# Patient Record
Sex: Female | Born: 1950 | Race: White | Hispanic: No | Smoking: Former smoker
Health system: Southern US, Community
[De-identification: ages and names within clinical notes are randomized; demographics above are authoritative.]

## PROBLEM LIST (undated history)

## (undated) ENCOUNTER — Emergency Department (HOSPITAL_COMMUNITY): Payer: Self-pay

## (undated) ENCOUNTER — Ambulatory Visit: Admission: EM | Payer: Medicare Other

## (undated) DIAGNOSIS — Z9841 Cataract extraction status, right eye: Secondary | ICD-10-CM

## (undated) DIAGNOSIS — N183 Chronic kidney disease, stage 3 unspecified: Secondary | ICD-10-CM

## (undated) DIAGNOSIS — G4730 Sleep apnea, unspecified: Secondary | ICD-10-CM

## (undated) DIAGNOSIS — Z79620 Long term (current) use of immunosuppressive biologic: Secondary | ICD-10-CM

## (undated) DIAGNOSIS — E119 Type 2 diabetes mellitus without complications: Secondary | ICD-10-CM

## (undated) DIAGNOSIS — M1712 Unilateral primary osteoarthritis, left knee: Secondary | ICD-10-CM

## (undated) DIAGNOSIS — I10 Essential (primary) hypertension: Secondary | ICD-10-CM

## (undated) DIAGNOSIS — E785 Hyperlipidemia, unspecified: Secondary | ICD-10-CM

## (undated) DIAGNOSIS — L409 Psoriasis, unspecified: Secondary | ICD-10-CM

## (undated) DIAGNOSIS — R5383 Other fatigue: Secondary | ICD-10-CM

## (undated) DIAGNOSIS — M25569 Pain in unspecified knee: Secondary | ICD-10-CM

## (undated) DIAGNOSIS — I3139 Other pericardial effusion (noninflammatory): Secondary | ICD-10-CM

## (undated) DIAGNOSIS — I2510 Atherosclerotic heart disease of native coronary artery without angina pectoris: Secondary | ICD-10-CM

## (undated) DIAGNOSIS — I5189 Other ill-defined heart diseases: Secondary | ICD-10-CM

## (undated) DIAGNOSIS — K5790 Diverticulosis of intestine, part unspecified, without perforation or abscess without bleeding: Secondary | ICD-10-CM

## (undated) DIAGNOSIS — Z9989 Dependence on other enabling machines and devices: Secondary | ICD-10-CM

## (undated) DIAGNOSIS — J449 Chronic obstructive pulmonary disease, unspecified: Secondary | ICD-10-CM

## (undated) DIAGNOSIS — R7303 Prediabetes: Secondary | ICD-10-CM

## (undated) DIAGNOSIS — C801 Malignant (primary) neoplasm, unspecified: Secondary | ICD-10-CM

## (undated) DIAGNOSIS — G4733 Obstructive sleep apnea (adult) (pediatric): Secondary | ICD-10-CM

## (undated) DIAGNOSIS — H269 Unspecified cataract: Secondary | ICD-10-CM

## (undated) DIAGNOSIS — G4700 Insomnia, unspecified: Secondary | ICD-10-CM

## (undated) HISTORY — PX: LUMBAR SPINE SURGERY: SHX701

## (undated) HISTORY — PX: BLADDER SURGERY: SHX569

## (undated) HISTORY — PX: BACK SURGERY: SHX140

## (undated) HISTORY — PX: VAGINAL HYSTERECTOMY: SHX2639

## (undated) HISTORY — PX: ABDOMINAL HYSTERECTOMY: SHX81

## (undated) HISTORY — PX: VITRECTOMY: SHX106

## (undated) HISTORY — PX: KNEE SURGERY: SHX244

---

## 1998-04-10 DIAGNOSIS — C679 Malignant neoplasm of bladder, unspecified: Secondary | ICD-10-CM

## 1998-04-10 HISTORY — DX: Malignant neoplasm of bladder, unspecified: C67.9

## 2012-07-14 ENCOUNTER — Ambulatory Visit: Payer: Self-pay

## 2012-07-16 DIAGNOSIS — D62 Acute posthemorrhagic anemia: Secondary | ICD-10-CM | POA: Insufficient documentation

## 2012-07-16 HISTORY — PX: COLONOSCOPY: SHX174

## 2012-07-17 DIAGNOSIS — K5790 Diverticulosis of intestine, part unspecified, without perforation or abscess without bleeding: Secondary | ICD-10-CM | POA: Insufficient documentation

## 2012-09-23 ENCOUNTER — Ambulatory Visit: Payer: Self-pay | Admitting: Family Medicine

## 2013-07-17 ENCOUNTER — Emergency Department: Payer: Self-pay | Admitting: Emergency Medicine

## 2013-07-18 ENCOUNTER — Ambulatory Visit: Payer: Self-pay | Admitting: Family Medicine

## 2013-09-03 DIAGNOSIS — R7303 Prediabetes: Secondary | ICD-10-CM | POA: Insufficient documentation

## 2013-09-03 DIAGNOSIS — E1169 Type 2 diabetes mellitus with other specified complication: Secondary | ICD-10-CM | POA: Insufficient documentation

## 2013-09-03 DIAGNOSIS — E119 Type 2 diabetes mellitus without complications: Secondary | ICD-10-CM | POA: Insufficient documentation

## 2013-09-25 DIAGNOSIS — R4589 Other symptoms and signs involving emotional state: Secondary | ICD-10-CM | POA: Insufficient documentation

## 2013-09-25 DIAGNOSIS — G479 Sleep disorder, unspecified: Secondary | ICD-10-CM | POA: Insufficient documentation

## 2013-10-07 ENCOUNTER — Ambulatory Visit: Payer: Self-pay | Admitting: Neurology

## 2013-10-28 ENCOUNTER — Ambulatory Visit: Payer: Self-pay | Admitting: Neurology

## 2013-11-12 ENCOUNTER — Ambulatory Visit: Payer: Self-pay | Admitting: Neurology

## 2013-12-09 ENCOUNTER — Ambulatory Visit: Payer: Self-pay | Admitting: Neurology

## 2013-12-09 ENCOUNTER — Ambulatory Visit: Payer: Self-pay | Admitting: Family Medicine

## 2014-01-08 ENCOUNTER — Ambulatory Visit: Payer: Self-pay | Admitting: Neurology

## 2014-03-26 DIAGNOSIS — F32A Depression, unspecified: Secondary | ICD-10-CM | POA: Insufficient documentation

## 2014-09-25 ENCOUNTER — Ambulatory Visit: Payer: Self-pay | Admitting: Podiatry

## 2016-08-08 ENCOUNTER — Other Ambulatory Visit: Payer: Self-pay | Admitting: Family Medicine

## 2016-08-08 DIAGNOSIS — Z1231 Encounter for screening mammogram for malignant neoplasm of breast: Secondary | ICD-10-CM

## 2016-08-16 ENCOUNTER — Emergency Department
Admission: EM | Admit: 2016-08-16 | Discharge: 2016-08-16 | Disposition: A | Discharge status: Home / Self Care | Payer: Medicare Other | Attending: Student in an Organized Health Care Education/Training Program | Admitting: Student in an Organized Health Care Education/Training Program

## 2016-08-16 ENCOUNTER — Encounter: Payer: Self-pay | Admitting: Emergency Medicine

## 2016-08-16 DIAGNOSIS — R519 Headache, unspecified: Secondary | ICD-10-CM

## 2016-08-16 DIAGNOSIS — I10 Essential (primary) hypertension: Secondary | ICD-10-CM

## 2016-08-16 DIAGNOSIS — Z87891 Personal history of nicotine dependence: Secondary | ICD-10-CM | POA: Diagnosis not present

## 2016-08-16 DIAGNOSIS — R51 Headache: Secondary | ICD-10-CM | POA: Diagnosis present

## 2016-08-16 LAB — CBC
HCT: 47.3 % — ABNORMAL HIGH (ref 35.0–47.0)
Hemoglobin: 15.8 g/dL (ref 12.0–16.0)
MCH: 29.1 pg (ref 26.0–34.0)
MCHC: 33.5 g/dL (ref 32.0–36.0)
MCV: 86.9 fL (ref 80.0–100.0)
PLATELETS: 221 10*3/uL (ref 150–440)
RBC: 5.44 MIL/uL — ABNORMAL HIGH (ref 3.80–5.20)
RDW: 13.6 % (ref 11.5–14.5)
WBC: 14.7 10*3/uL — ABNORMAL HIGH (ref 3.6–11.0)

## 2016-08-16 LAB — BASIC METABOLIC PANEL
Anion gap: 7 (ref 5–15)
BUN: 12 mg/dL (ref 6–20)
CHLORIDE: 102 mmol/L (ref 101–111)
CO2: 29 mmol/L (ref 22–32)
CREATININE: 1.14 mg/dL — AB (ref 0.44–1.00)
Calcium: 10 mg/dL (ref 8.9–10.3)
GFR calc Af Amer: 56 mL/min — ABNORMAL LOW (ref >60)
GFR calc non Af Amer: 49 mL/min — ABNORMAL LOW (ref >60)
GLUCOSE: 113 mg/dL — AB (ref 65–99)
POTASSIUM: 3.8 mmol/L (ref 3.5–5.1)
SODIUM: 138 mmol/L (ref 135–145)

## 2016-08-16 LAB — TROPONIN I

## 2016-08-16 MED ORDER — ACETAMINOPHEN 500 MG PO TABS
1000.0000 mg | ORAL_TABLET | Freq: Once | ORAL | Status: AC
  Administered 2016-08-16: 1000 mg via ORAL

## 2016-08-16 MED ORDER — ACETAMINOPHEN 500 MG PO TABS
ORAL_TABLET | ORAL | Status: AC
  Filled 2016-08-16: qty 2

## 2016-08-16 MED ORDER — AMLODIPINE BESYLATE 5 MG PO TABS: 5.0000 mg | ORAL_TABLET | Freq: Every day | ORAL | 0 refills | Status: DC

## 2016-08-16 MED ORDER — AMLODIPINE BESYLATE 5 MG PO TABS
5.0000 mg | ORAL_TABLET | Freq: Once | ORAL | Status: AC
  Administered 2016-08-16: 5 mg via ORAL
  Filled 2016-08-16: qty 1

## 2016-08-16 NOTE — ED Triage Notes (Signed)
Pt to ed with c/o HTN this am,  Pt states she took HCTZ and lasix this am.  Pt denies chest pain, denies blurred vision,  States she had a headache but took 2 tylenol and felt better.  Pt alert and oriented and appears in nad. Pt cardiologist, Dr Ubaldo Glassing.

## 2016-08-16 NOTE — ED Provider Notes (Signed)
[facility] Emergency Department Provider Note    None    (approximate)  I have reviewed the triage vital signs and the nursing notes.   HISTORY  Chief Complaint Hypertension    HPI Kelsey Mendoza is a 66 y.o. female Presents with concern for elevated blood pressure since yesterday. Patient is on HCTZ and Lasix for her blood pressure which is managed by Dr. Ubaldo Glassing.  She checked her blood pressure this morning and noted it was elevated to 190/110. She denies any chest pain. No shortness of breath. No lower extremity swelling. No blurry vision. No numbness or tingling. States that she took the blood pressure again it was persistently elevated. This was despite taking her morning HCTZ and Lasix. Patient came in to the ER to be further evaluated.   History reviewed. No pertinent past medical history. History reviewed. No pertinent family history. History reviewed. No pertinent surgical history. There are no active problems to display for this patient.     Prior to Admission medications   Not on File    Allergies Patient has no known allergies.    Social History Social History  Substance Use Topics  . Smoking status: Former Research scientist (life sciences)  . Smokeless tobacco: Never Used  . Alcohol use No    Review of Systems Patient denies headaches, rhinorrhea, blurry vision, numbness, shortness of breath, chest pain, edema, cough, abdominal pain, nausea, vomiting, diarrhea, dysuria, fevers, rashes or hallucinations unless otherwise stated above in HPI. ____________________________________________   PHYSICAL EXAM:  VITAL SIGNS: Vitals:   08/16/16 1141 08/16/16 1325  BP: (!) 195/102 (!) 191/108  Pulse: (!) 59   Resp: 18   Temp: 98.6 F (37 C)     Constitutional: Alert and oriented. Well appearing and in no acute distress. Eyes: Conjunctivae are normal. PERRL. EOMI. Head: Atraumatic. Nose: No congestion/rhinnorhea. Mouth/Throat: Mucous membranes are  moist.  Oropharynx non-erythematous. Neck: No stridor. Painless ROM. No cervical spine tenderness to palpation Hematological/Lymphatic/Immunilogical: No cervical lymphadenopathy. Cardiovascular: Normal rate, regular rhythm. Grossly normal heart sounds.  Good peripheral circulation. Respiratory: Normal respiratory effort.  No retractions. Lungs CTAB. Gastrointestinal: Soft and nontender. No distention. No abdominal bruits. No CVA tenderness. Genitourinary:  Musculoskeletal: No lower extremity tenderness nor edema.  No joint effusions. Neurologic:  Normal speech and language. No gross focal neurologic deficits are appreciated. No gait instability. Skin:  Skin is warm, dry and intact. No rash noted. Psychiatric: Mood and affect are normal. Speech and behavior are normal.  ____________________________________________   LABS (all labs ordered are listed, but only abnormal results are displayed)  Results for orders placed or performed during the hospital encounter of 08/16/16 (from the past 24 hour(s))  CBC     Status: Abnormal   Collection Time: 08/16/16 11:53 AM  Result Value Ref Range   WBC 14.7 (H) 3.6 - 11.0 K/uL   RBC 5.44 (H) 3.80 - 5.20 MIL/uL   Hemoglobin 15.8 12.0 - 16.0 g/dL   HCT 47.3 (H) 35.0 - 47.0 %   MCV 86.9 80.0 - 100.0 fL   MCH 29.1 26.0 - 34.0 pg   MCHC 33.5 32.0 - 36.0 g/dL   RDW 13.6 11.5 - 14.5 %   Platelets 221 150 - 440 K/uL  Basic metabolic panel     Status: Abnormal   Collection Time: 08/16/16 11:53 AM  Result Value Ref Range   Sodium 138 135 - 145 mmol/L   Potassium 3.8 3.5 - 5.1 mmol/L   Chloride 102 101 -  111 mmol/L   CO2 29 22 - 32 mmol/L   Glucose, Bld 113 (H) 65 - 99 mg/dL   BUN 12 6 - 20 mg/dL   Creatinine, Ser 1.14 (H) 0.44 - 1.00 mg/dL   Calcium 10.0 8.9 - 10.3 mg/dL   GFR calc non Af Amer 49 (L) >60 mL/min   GFR calc Af Amer 56 (L) >60 mL/min   Anion gap 7 5 - 15  Troponin I     Status: None   Collection Time: 08/16/16 11:53 AM  Result  Value Ref Range   Troponin I <0.03 <0.03 ng/mL   ____________________________________________  EKG My review and personal interpretation at Time: 11:50   Indication: htn  Rate: 53  Rhythm: sinus Axis: normal Other: IRBB, no stemi, normal intervals ____________________________________________  RADIOLOGY   ____________________________________________   PROCEDURES  Procedure(s) performed:  Procedures    Critical Care performed: no ____________________________________________   INITIAL IMPRESSION / ASSESSMENT AND PLAN / ED COURSE  Pertinent labs & imaging results that were available during my care of the patient were reviewed by me and considered in my medical decision making (see chart for details).  DDX: HTN, chf, med noncompliance  Kelsey Mendoza is a 66 y.o. non-distressed patient presenting with concern for elevated BP. Patient is AF,VSS with HTN in ED. Exam as above. Given current presentation have considered the above differential. No report of missed antihypertensive doses or medical non-compliance. No* report of illicit drug use that could elevate BP. Extensive evaluation of possible end organ damage pursued in ED. No evidence of acute renal dysfunction. Neuro exam with no focal deficits. EKG withno evidence of ischemia. Trop negative. Renal function at baseline. Not consistent with CHF, malignant htn, adrenergic crisis or hypertensive emergency.   Will have patient start low dose amlodipine follow up with PCP for BP recheck. Discussed strict return parameters.       ____________________________________________   FINAL CLINICAL IMPRESSION(S) / ED DIAGNOSES  Final diagnoses:  Hypertension, unspecified type  Acute nonintractable headache, unspecified headache type      NEW MEDICATIONS STARTED DURING THIS VISIT:  New Prescriptions   No medications on file     Note:  This document was prepared using Dragon voice recognition software and may include  unintentional dictation errors.    Merlyn Lot, MD 08/16/16 [phone]

## 2016-08-30 ENCOUNTER — Ambulatory Visit
Admission: RE | Admit: 2016-08-30 | Discharge: 2016-08-30 | Disposition: A | Discharge status: Home / Self Care | Payer: Medicare Other | Source: Ambulatory Visit | Attending: Family Medicine | Admitting: Family Medicine

## 2016-08-30 DIAGNOSIS — Z1231 Encounter for screening mammogram for malignant neoplasm of breast: Secondary | ICD-10-CM | POA: Diagnosis not present

## 2016-10-30 ENCOUNTER — Other Ambulatory Visit: Payer: Self-pay | Admitting: Unknown Physician Specialty

## 2016-10-30 DIAGNOSIS — M1711 Unilateral primary osteoarthritis, right knee: Secondary | ICD-10-CM

## 2016-11-03 ENCOUNTER — Ambulatory Visit
Admission: RE | Admit: 2016-11-03 | Discharge: 2016-11-03 | Disposition: A | Discharge status: Home / Self Care | Payer: Medicare Other | Source: Ambulatory Visit | Attending: Unknown Physician Specialty | Admitting: Unknown Physician Specialty

## 2016-11-03 DIAGNOSIS — M94261 Chondromalacia, right knee: Secondary | ICD-10-CM | POA: Insufficient documentation

## 2016-11-03 DIAGNOSIS — M25461 Effusion, right knee: Secondary | ICD-10-CM | POA: Diagnosis not present

## 2016-11-03 DIAGNOSIS — M1711 Unilateral primary osteoarthritis, right knee: Secondary | ICD-10-CM | POA: Insufficient documentation

## 2016-11-03 DIAGNOSIS — S83241A Other tear of medial meniscus, current injury, right knee, initial encounter: Secondary | ICD-10-CM | POA: Insufficient documentation

## 2016-11-03 DIAGNOSIS — X58XXXA Exposure to other specified factors, initial encounter: Secondary | ICD-10-CM | POA: Diagnosis not present

## 2016-11-27 DIAGNOSIS — S8002XA Contusion of left knee, initial encounter: Secondary | ICD-10-CM | POA: Insufficient documentation

## 2016-11-27 DIAGNOSIS — S8001XA Contusion of right knee, initial encounter: Secondary | ICD-10-CM | POA: Insufficient documentation

## 2016-12-29 HISTORY — PX: MEDIAL PARTIAL KNEE REPLACEMENT: SHX5965

## 2017-01-30 ENCOUNTER — Ambulatory Visit
Admission: EM | Admit: 2017-01-30 | Discharge: 2017-01-30 | Disposition: A | Discharge status: Home / Self Care | Payer: Medicare Other | Attending: Family Medicine | Admitting: Family Medicine

## 2017-01-30 ENCOUNTER — Ambulatory Visit: Payer: Medicare Other

## 2017-01-30 ENCOUNTER — Encounter: Payer: Self-pay | Admitting: *Deleted

## 2017-01-30 DIAGNOSIS — M25511 Pain in right shoulder: Secondary | ICD-10-CM | POA: Diagnosis present

## 2017-01-30 DIAGNOSIS — M5412 Radiculopathy, cervical region: Secondary | ICD-10-CM

## 2017-01-30 DIAGNOSIS — M5030 Other cervical disc degeneration, unspecified cervical region: Secondary | ICD-10-CM | POA: Insufficient documentation

## 2017-01-30 HISTORY — DX: Pain in unspecified knee: M25.569

## 2017-01-30 HISTORY — DX: Essential (primary) hypertension: I10

## 2017-01-30 HISTORY — DX: Dependence on other enabling machines and devices: Z99.89

## 2017-01-30 HISTORY — DX: Malignant (primary) neoplasm, unspecified: C80.1

## 2017-01-30 MED ORDER — KETOROLAC TROMETHAMINE 60 MG/2ML IM SOLN
30.0000 mg | Freq: Once | INTRAMUSCULAR | Status: AC
  Administered 2017-01-30: 30 mg via INTRAMUSCULAR

## 2017-01-30 MED ORDER — NAPROXEN 375 MG PO TABS: 375.0000 mg | ORAL_TABLET | Freq: Two times a day (BID) | ORAL | 0 refills | Status: DC

## 2017-01-30 MED ORDER — DIAZEPAM 2 MG PO TABS: ORAL_TABLET | ORAL | 0 refills | Status: DC

## 2017-01-30 MED ORDER — TIZANIDINE HCL 4 MG PO CAPS: 4.0000 mg | ORAL_CAPSULE | Freq: Three times a day (TID) | ORAL | 0 refills | Status: DC

## 2017-01-30 NOTE — ED Provider Notes (Signed)
[facility]    CSN: [id number] Arrival date & time: 01/30/17  6010     History   Chief Complaint Chief Complaint  Patient presents with  . Shoulder Pain    HPI Kelsey Mendoza is a 66 y.o. female.   HPI  This a 66 year old female who presents with 2 day history of right shoulder blade pain and right trapezial pain with radiation into her right posterior arm to the level of her elbow. Started very suddenly she was watching TV. No history of any injury that she can remember. He states that the pain has been present almost constantly for the last 3 days radiating at as an 8-9 out of 10 level. Denies any numbness or tingling into her fingers. Cervical range of motion is nearly full except to rightward rotation and lateral flexion and extension.        Past Medical History:  Diagnosis Date  . Arthritis   . Cancer ([place])   . CPAP (continuous positive airway pressure) dependence   . Hypertension   . Knee pain     There are no active problems to display for this patient.   Past Surgical History:  Procedure Laterality Date  . ABDOMINAL HYSTERECTOMY    . BACK SURGERY    . BLADDER SURGERY    . KNEE SURGERY      OB History    No data available       Home Medications    Prior to Admission medications   Medication Sig Start Date End Date Taking? Authorizing Provider  amLODipine (NORVASC) 5 MG tablet Take 1 tablet (5 mg total) by mouth daily. 08/16/16 08/16/17 Yes Merlyn Lot, MD  aspirin 325 MG tablet Take 325 mg by mouth daily.   Yes [provider]  Cholecalciferol (VITAMIN D3 PO) Take by mouth daily.   Yes [provider]  furosemide (LASIX) 40 MG tablet Take 40 mg by mouth as needed.   Yes [provider]  hydrocortisone 2.5 % cream Apply topically 2 (two) times daily.   Yes [provider]  ketoconazole (NIZORAL) 2 % cream Apply 1 application topically daily.   Yes [provider]  ketoconazole  (NIZORAL) 2 % shampoo Apply 1 application topically 2 (two) times a week.   Yes [provider]  losartan (COZAAR) 100 MG tablet Take 100 mg by mouth daily.   Yes [provider]  meloxicam (MOBIC) 7.5 MG tablet Take 7.5 mg by mouth daily.   Yes [provider]  OxyCODONE HCl, Abuse Deter, (OXAYDO) 5 MG TABA Take 5 mg by mouth as needed.   Yes [provider]  rosuvastatin (CRESTOR) 5 MG tablet Take 5 mg by mouth daily.   Yes [provider]  triamterene-hydrochlorothiazide (MAXZIDE-25) 37.5-25 MG tablet Take 1 tablet by mouth daily.   Yes [provider]  urea (CARMOL) 20 % cream Apply topically daily as needed.   Yes [provider]  diazepam (VALIUM) 2 MG tablet Take 1 tablet every 8 hours for muscle spasm. After completing the 6 tablets switch over to the regular muscle relaxant. 01/30/17   Lorin Picket, PA-C  naproxen (NAPROSYN) 375 MG tablet Take 1 tablet (375 mg total) by mouth 2 (two) times daily. Take with food 01/30/17   Crecencio Mc P, PA-C  tiZANidine (ZANAFLEX) 4 MG capsule Take 1 capsule (4 mg total) by mouth 3 (three) times daily. 01/30/17   Lorin Picket, PA-C    Family History  Family History  Problem Relation Age of Onset  . Hypertension Mother   . Hypertension Father   . CAD Father   . Breast cancer Neg Hx     Social History Social History  Substance Use Topics  . Smoking status: Former Research scientist (life sciences)  . Smokeless tobacco: Never Used  . Alcohol use No     Allergies   Patient has no known allergies.   Review of Systems Review of Systems  Constitutional: Positive for activity change. Negative for appetite change, chills, diaphoresis, fatigue and fever.  Musculoskeletal: Positive for myalgias, neck pain and neck stiffness.  All other systems reviewed and are negative.    Physical Exam Triage Vital Signs ED Triage Vitals  Enc Vitals Group     BP --      Pulse Rate 01/30/17 0851 70     Resp  01/30/17 0851 18     Temp 01/30/17 0851 98.2 F (36.8 C)     Temp Source 01/30/17 0851 Oral     SpO2 01/30/17 0851 100 %     Weight 01/30/17 0852 222 lb (100.7 kg)     Height 01/30/17 0852 5\' 4"  (1.626 m)     Head Circumference --      Peak Flow --      Pain Score 01/30/17 0852 9     Pain Loc --      Pain Edu? --      Excl. in [place]? --    No data found.   Updated Vital Signs Pulse 70   Temp 98.2 F (36.8 C) (Oral)   Resp 18   Ht 5\' 4"  (1.626 m)   Wt 222 lb (100.7 kg)   SpO2 100%   BMI 38.11 kg/m   Visual Acuity Right Eye Distance:   Left Eye Distance:   Bilateral Distance:    Right Eye Near:   Left Eye Near:    Bilateral Near:     Physical Exam  Constitutional: She appears well-developed and well-nourished. No distress.  HENT:  Head: Normocephalic.  Right Ear: External ear normal.  Left Ear: External ear normal.  Nose: Nose normal.  Mouth/Throat: Oropharynx is clear and moist. No oropharyngeal exudate.  Eyes: Pupils are equal, round, and reactive to light. Right eye exhibits no discharge. Left eye exhibits no discharge.  Neck: Neck supple.  Examination of the cervical spine shows the patient to favor a rightward lateral list at rest. He has good range of motion of her cervical spine except for right rotation and right lateral flexion and extension which causes her discomfort. We tender right trapezius as well as rhomboids. Upper extremity strength is intact and upper extremity sensation is intact to light touch. DTRs are 2+ over 4 and symmetrical bilaterally.  Lymphadenopathy:    She has no cervical adenopathy.  Skin: She is not diaphoretic.  Nursing note and vitals reviewed.    UC Treatments / Results  Labs (all labs ordered are listed, but only abnormal results are displayed) Labs Reviewed - No data to display  EKG  EKG Interpretation None       Radiology Dg Cervical Spine Complete  Result Date: 01/30/2017 CLINICAL DATA:  66 year old female with  pain posteromedial border right shoulder blades for 4 days. Pain extends to right elbow. No neck pain or injury. Initial encounter. EXAM: CERVICAL SPINE - COMPLETE 4+ VIEW COMPARISON:  None. FINDINGS: Straightening of the cervical spine possibly related to head position or muscle spasm. No abnormal prevertebral soft tissue swelling.  No cervical spine fracture. Minimal C4-5 and mild C5-6 disc space narrowing. Uncinate hypertrophy contributes to minimal bilateral C3-4 and C5-6 foraminal narrowing. Minimal right-sided C4-5 and mild left-sided C4-5 foraminal narrowing. Visualized lung apices are clear. IMPRESSION: Minimal C4-5 and mild C5-6 disc space narrowing. Uncinate hypertrophy contributes to minimal bilateral C3-4 and C5-6 foraminal narrowing. Also minimal right-sided and mild left-sided C4-5 foraminal narrowing. Straightening of the cervical spine may be related to head position or muscle spasm. Electronically Signed   By: Genia Del M.D.   On: 01/30/2017 10:06    Procedures Procedures (including critical care time)  Medications Ordered in UC Medications  ketorolac (TORADOL) injection 30 mg (30 mg Intramuscular Given 01/30/17 0931)     Initial Impression / Assessment and Plan / UC Course  I have reviewed the triage vital signs and the nursing notes.  Pertinent labs & imaging results that were available during my care of the patient were reviewed by me and considered in my medical decision making (see chart for details).     Plan: 1. Test/x-ray results and diagnosis reviewed with patient 2. rx as per orders; risks, benefits, potential side effects reviewed with patient 3. Recommend supportive treatment with rest and symptom avoidance. Use of a soft collar as necessary for severe pain. She was cautioned not to use it frequently as the neck musclest will become weak quickly. Will initiate treatment with Valium she has very severe spasm and then her to Zanaflex after the Valium has been  completed in 2 days. He was on Elaquis after her knee surgery but has discontinued that and is now taking 325 mg aspirin twice a day. I have asked her to contact her orthopedist to see if she may decrease this to 81 mg twice a day while taking the Naprosyn. If she is continuing to have discomfort I recommend she follow-up with her orthopedist or with primary care physician. 4. F/u prn if symptoms worsen or don't improve   Final Clinical Impressions(s) / UC Diagnoses   Final diagnoses:  Cervical radiculitis    New Prescriptions Discharge Medication List as of 01/30/2017 10:42 AM    START taking these medications   Details  diazepam (VALIUM) 2 MG tablet Take 1 tablet every 8 hours for muscle spasm. After completing the 6 tablets switch over to the regular muscle relaxant., Print    naproxen (NAPROSYN) 375 MG tablet Take 1 tablet (375 mg total) by mouth 2 (two) times daily. Take with food, Starting Tue 01/30/2017, Normal    tiZANidine (ZANAFLEX) 4 MG capsule Take 1 capsule (4 mg total) by mouth 3 (three) times daily., Starting Tue 01/30/2017, Normal         Controlled Substance Prescriptions [place] Controlled Substance Registry consulted? Not Applicable   Lorin Picket, PA-C 01/30/17 2042

## 2017-01-30 NOTE — Discharge Instructions (Signed)
Use ice on the area of pain  20 minutes out of every 2 hours 4-5 times daily. Use Valium for muscle relaxation for the first 2 days then switch to the other muscle relaxer  3 times daily as necessary. Do not perform activities requiring concentration or judgment or drive while taking the muscle relaxers or the Valium. Contact your orthopedist to see if you can decrease your aspirin to 81 mg daily instead of 325 while taking the Naprosyn

## 2017-01-30 NOTE — ED Triage Notes (Signed)
Patient started having sudden right shoulder blade pain 3 days ago. Mechanism of injury unknown.

## 2017-06-05 ENCOUNTER — Encounter: Payer: Self-pay | Admitting: Emergency Medicine

## 2017-06-05 ENCOUNTER — Observation Stay
Admission: EM | Admit: 2017-06-05 | Discharge: 2017-06-07 | Disposition: A | Discharge status: Home / Self Care | Payer: Medicare Other | Attending: Orthopedic Surgery | Admitting: Orthopedic Surgery

## 2017-06-05 ENCOUNTER — Emergency Department: Payer: Medicare Other

## 2017-06-05 ENCOUNTER — Other Ambulatory Visit: Payer: Self-pay

## 2017-06-05 DIAGNOSIS — S52301B Unspecified fracture of shaft of right radius, initial encounter for open fracture type I or II: Secondary | ICD-10-CM

## 2017-06-05 DIAGNOSIS — R001 Bradycardia, unspecified: Secondary | ICD-10-CM

## 2017-06-05 DIAGNOSIS — M25561 Pain in right knee: Secondary | ICD-10-CM | POA: Insufficient documentation

## 2017-06-05 DIAGNOSIS — W010XXA Fall on same level from slipping, tripping and stumbling without subsequent striking against object, initial encounter: Secondary | ICD-10-CM | POA: Insufficient documentation

## 2017-06-05 DIAGNOSIS — S61511A Laceration without foreign body of right wrist, initial encounter: Secondary | ICD-10-CM | POA: Diagnosis present

## 2017-06-05 DIAGNOSIS — I959 Hypotension, unspecified: Secondary | ICD-10-CM | POA: Diagnosis not present

## 2017-06-05 DIAGNOSIS — K219 Gastro-esophageal reflux disease without esophagitis: Secondary | ICD-10-CM | POA: Diagnosis not present

## 2017-06-05 DIAGNOSIS — S52501A Unspecified fracture of the lower end of right radius, initial encounter for closed fracture: Secondary | ICD-10-CM

## 2017-06-05 DIAGNOSIS — S52611B Displaced fracture of right ulna styloid process, initial encounter for open fracture type I or II: Secondary | ICD-10-CM | POA: Diagnosis not present

## 2017-06-05 DIAGNOSIS — S52571B Other intraarticular fracture of lower end of right radius, initial encounter for open fracture type I or II: Secondary | ICD-10-CM | POA: Insufficient documentation

## 2017-06-05 DIAGNOSIS — E876 Hypokalemia: Secondary | ICD-10-CM | POA: Diagnosis not present

## 2017-06-05 DIAGNOSIS — J9601 Acute respiratory failure with hypoxia: Secondary | ICD-10-CM | POA: Diagnosis not present

## 2017-06-05 DIAGNOSIS — G4733 Obstructive sleep apnea (adult) (pediatric): Secondary | ICD-10-CM | POA: Diagnosis not present

## 2017-06-05 DIAGNOSIS — E785 Hyperlipidemia, unspecified: Secondary | ICD-10-CM | POA: Insufficient documentation

## 2017-06-05 DIAGNOSIS — Z87891 Personal history of nicotine dependence: Secondary | ICD-10-CM | POA: Diagnosis not present

## 2017-06-05 DIAGNOSIS — G4730 Sleep apnea, unspecified: Secondary | ICD-10-CM | POA: Insufficient documentation

## 2017-06-05 DIAGNOSIS — Z9989 Dependence on other enabling machines and devices: Secondary | ICD-10-CM | POA: Diagnosis not present

## 2017-06-05 DIAGNOSIS — Z79899 Other long term (current) drug therapy: Secondary | ICD-10-CM | POA: Diagnosis not present

## 2017-06-05 DIAGNOSIS — S52691B Other fracture of lower end of right ulna, initial encounter for open fracture type I or II: Secondary | ICD-10-CM | POA: Diagnosis not present

## 2017-06-05 DIAGNOSIS — Z8249 Family history of ischemic heart disease and other diseases of the circulatory system: Secondary | ICD-10-CM | POA: Insufficient documentation

## 2017-06-05 DIAGNOSIS — S52351B Displaced comminuted fracture of shaft of radius, right arm, initial encounter for open fracture type I or II: Principal | ICD-10-CM

## 2017-06-05 DIAGNOSIS — Y9289 Other specified places as the place of occurrence of the external cause: Secondary | ICD-10-CM | POA: Diagnosis not present

## 2017-06-05 DIAGNOSIS — Y998 Other external cause status: Secondary | ICD-10-CM | POA: Diagnosis not present

## 2017-06-05 DIAGNOSIS — Z23 Encounter for immunization: Secondary | ICD-10-CM | POA: Insufficient documentation

## 2017-06-05 DIAGNOSIS — Y9389 Activity, other specified: Secondary | ICD-10-CM | POA: Diagnosis not present

## 2017-06-05 DIAGNOSIS — S52201B Unspecified fracture of shaft of right ulna, initial encounter for open fracture type I or II: Secondary | ICD-10-CM

## 2017-06-05 DIAGNOSIS — I10 Essential (primary) hypertension: Secondary | ICD-10-CM | POA: Diagnosis not present

## 2017-06-05 DIAGNOSIS — R0902 Hypoxemia: Secondary | ICD-10-CM

## 2017-06-05 LAB — CBC
HCT: 39.9 % (ref 35.0–47.0)
HEMOGLOBIN: 13 g/dL (ref 12.0–16.0)
MCH: 28.2 pg (ref 26.0–34.0)
MCHC: 32.5 g/dL (ref 32.0–36.0)
MCV: 86.7 fL (ref 80.0–100.0)
Platelets: 232 10*3/uL (ref 150–440)
RBC: 4.6 MIL/uL (ref 3.80–5.20)
RDW: 13.2 % (ref 11.5–14.5)
WBC: 11.7 10*3/uL — ABNORMAL HIGH (ref 3.6–11.0)

## 2017-06-05 LAB — BASIC METABOLIC PANEL
ANION GAP: 9 (ref 5–15)
BUN: 15 mg/dL (ref 6–20)
CALCIUM: 8.8 mg/dL — AB (ref 8.9–10.3)
CO2: 24 mmol/L (ref 22–32)
Chloride: 107 mmol/L (ref 101–111)
Creatinine, Ser: 1.33 mg/dL — ABNORMAL HIGH (ref 0.44–1.00)
GFR, EST AFRICAN AMERICAN: 46 mL/min — AB (ref >60)
GFR, EST NON AFRICAN AMERICAN: 40 mL/min — AB (ref >60)
Glucose, Bld: 129 mg/dL — ABNORMAL HIGH (ref 65–99)
Potassium: 3.3 mmol/L — ABNORMAL LOW (ref 3.5–5.1)
Sodium: 140 mmol/L (ref 135–145)

## 2017-06-05 LAB — TYPE AND SCREEN
ABO/RH(D): O POS
Antibody Screen: NEGATIVE

## 2017-06-05 LAB — PROTIME-INR
INR: 0.89
PROTHROMBIN TIME: 12 s (ref 11.4–15.2)

## 2017-06-05 LAB — APTT: aPTT: 25 seconds (ref 24–36)

## 2017-06-05 LAB — GLUCOSE, CAPILLARY: GLUCOSE-CAPILLARY: 117 mg/dL — AB (ref 65–99)

## 2017-06-05 MED ORDER — ONDANSETRON HCL 4 MG/2ML IJ SOLN
INTRAMUSCULAR | Status: AC
  Administered 2017-06-05: 4 mg via INTRAVENOUS
  Filled 2017-06-05: qty 2

## 2017-06-05 MED ORDER — ONDANSETRON HCL 4 MG/2ML IJ SOLN
4.0000 mg | Freq: Once | INTRAMUSCULAR | Status: AC | PRN
  Administered 2017-06-05: 4 mg via INTRAVENOUS
  Filled 2017-06-05: qty 2

## 2017-06-05 MED ORDER — SODIUM CHLORIDE 0.9 % IV SOLN
INTRAVENOUS | Status: DC
  Administered 2017-06-05 – 2017-06-06 (×4): via INTRAVENOUS

## 2017-06-05 MED ORDER — CEFAZOLIN SODIUM-DEXTROSE 1-4 GM/50ML-% IV SOLN
1.0000 g | Freq: Once | INTRAVENOUS | Status: AC
  Administered 2017-06-05: 1 g via INTRAVENOUS
  Filled 2017-06-05: qty 50

## 2017-06-05 MED ORDER — CEFAZOLIN SODIUM-DEXTROSE 1-4 GM/50ML-% IV SOLN
1.0000 g | Freq: Four times a day (QID) | INTRAVENOUS | Status: DC
  Administered 2017-06-05 – 2017-06-06 (×3): 1 g via INTRAVENOUS
  Filled 2017-06-05 (×5): qty 50

## 2017-06-05 MED ORDER — SODIUM CHLORIDE 0.9 % IV SOLN
Freq: Once | INTRAVENOUS | Status: AC
  Administered 2017-06-05: 16:00:00 via INTRAVENOUS

## 2017-06-05 MED ORDER — DOCUSATE SODIUM 100 MG PO CAPS
100.0000 mg | ORAL_CAPSULE | Freq: Two times a day (BID) | ORAL | Status: DC
  Administered 2017-06-05: 100 mg via ORAL
  Filled 2017-06-05: qty 1

## 2017-06-05 MED ORDER — INFLUENZA VAC SPLIT HIGH-DOSE 0.5 ML IM SUSY
0.5000 mL | PREFILLED_SYRINGE | INTRAMUSCULAR | Status: AC
  Administered 2017-06-07: 0.5 mL via INTRAMUSCULAR
  Filled 2017-06-05 (×2): qty 0.5

## 2017-06-05 MED ORDER — OXYCODONE-ACETAMINOPHEN 5-325 MG PO TABS: 1.0000 | ORAL_TABLET | Freq: Four times a day (QID) | ORAL | 0 refills | Status: DC | PRN

## 2017-06-05 MED ORDER — HYDROMORPHONE HCL 1 MG/ML IJ SOLN: 1.0000 mg | Freq: Once | INTRAMUSCULAR | Status: DC

## 2017-06-05 MED ORDER — METHOCARBAMOL 1000 MG/10ML IJ SOLN
500.0000 mg | Freq: Four times a day (QID) | INTRAVENOUS | Status: DC | PRN
  Filled 2017-06-05: qty 5

## 2017-06-05 MED ORDER — ACETAMINOPHEN 650 MG RE SUPP: 650.0000 mg | RECTAL | Status: DC | PRN

## 2017-06-05 MED ORDER — DIPHENHYDRAMINE HCL 12.5 MG/5ML PO ELIX: 12.5000 mg | ORAL_SOLUTION | ORAL | Status: DC | PRN

## 2017-06-05 MED ORDER — MORPHINE SULFATE (PF) 2 MG/ML IV SOLN
2.0000 mg | INTRAVENOUS | Status: DC | PRN
  Administered 2017-06-06 (×2): 2 mg via INTRAVENOUS
  Filled 2017-06-05 (×2): qty 1

## 2017-06-05 MED ORDER — ONDANSETRON HCL 4 MG/2ML IJ SOLN
4.0000 mg | Freq: Four times a day (QID) | INTRAMUSCULAR | Status: DC | PRN
  Administered 2017-06-05 – 2017-06-06 (×2): 4 mg via INTRAVENOUS
  Filled 2017-06-05: qty 2

## 2017-06-05 MED ORDER — ACETAMINOPHEN 325 MG PO TABS: 650.0000 mg | ORAL_TABLET | ORAL | Status: DC | PRN

## 2017-06-05 MED ORDER — LIDOCAINE-EPINEPHRINE (PF) 1 %-1:200000 IJ SOLN
20.0000 mL | Freq: Once | INTRAMUSCULAR | Status: DC
  Filled 2017-06-05: qty 30

## 2017-06-05 MED ORDER — PNEUMOCOCCAL VAC POLYVALENT 25 MCG/0.5ML IJ INJ
0.5000 mL | INJECTION | INTRAMUSCULAR | Status: AC
  Administered 2017-06-07: 0.5 mL via INTRAMUSCULAR
  Filled 2017-06-05: qty 0.5

## 2017-06-05 MED ORDER — ZOLPIDEM TARTRATE 5 MG PO TABS: 5.0000 mg | ORAL_TABLET | Freq: Every evening | ORAL | Status: DC | PRN

## 2017-06-05 MED ORDER — ALUM & MAG HYDROXIDE-SIMETH 200-200-20 MG/5ML PO SUSP: 30.0000 mL | ORAL | Status: DC | PRN

## 2017-06-05 MED ORDER — BISACODYL 10 MG RE SUPP: 10.0000 mg | Freq: Every day | RECTAL | Status: DC | PRN

## 2017-06-05 MED ORDER — FENTANYL CITRATE (PF) 100 MCG/2ML IJ SOLN
INTRAMUSCULAR | Status: AC
  Administered 2017-06-05: 100 ug via INTRAVENOUS
  Filled 2017-06-05: qty 2

## 2017-06-05 MED ORDER — POTASSIUM CHLORIDE 10 MEQ/100ML IV SOLN
10.0000 meq | INTRAVENOUS | Status: AC
Start: 2017-06-05 — End: 2017-06-05
  Administered 2017-06-05 (×2): 10 meq via INTRAVENOUS
  Filled 2017-06-05 (×2): qty 100

## 2017-06-05 MED ORDER — SODIUM CHLORIDE 0.9 % IV BOLUS (SEPSIS)
1000.0000 mL | Freq: Once | INTRAVENOUS | Status: AC
  Administered 2017-06-05: 1000 mL via INTRAVENOUS

## 2017-06-05 MED ORDER — HYDROMORPHONE HCL 1 MG/ML IJ SOLN
1.0000 mg | INTRAMUSCULAR | Status: AC | PRN
  Administered 2017-06-05 (×3): 1 mg via INTRAVENOUS
  Filled 2017-06-05 (×3): qty 1

## 2017-06-05 MED ORDER — OXYCODONE HCL 5 MG PO TABS
10.0000 mg | ORAL_TABLET | ORAL | Status: DC | PRN
  Administered 2017-06-06: 10 mg via ORAL
  Filled 2017-06-05: qty 2

## 2017-06-05 MED ORDER — FENTANYL CITRATE (PF) 100 MCG/2ML IJ SOLN
100.0000 ug | Freq: Once | INTRAMUSCULAR | Status: AC
  Administered 2017-06-05: 100 ug via INTRAVENOUS

## 2017-06-05 MED ORDER — METHOCARBAMOL 500 MG PO TABS: 500.0000 mg | ORAL_TABLET | Freq: Four times a day (QID) | ORAL | Status: DC | PRN

## 2017-06-05 MED ORDER — OXYCODONE HCL 5 MG PO TABS: 5.0000 mg | ORAL_TABLET | ORAL | Status: DC | PRN

## 2017-06-05 MED ORDER — ONDANSETRON HCL 4 MG PO TABS: 4.0000 mg | ORAL_TABLET | Freq: Four times a day (QID) | ORAL | Status: DC | PRN

## 2017-06-05 MED ORDER — POLYETHYLENE GLYCOL 3350 17 G PO PACK: 17.0000 g | PACK | Freq: Every day | ORAL | Status: DC | PRN

## 2017-06-05 NOTE — ED Provider Notes (Addendum)
[facility] Emergency Department Provider Note  ____________________________________________  Time seen: Approximately 1:41 PM  I have reviewed the triage vital signs and the nursing notes.   HISTORY  Chief Complaint Fall    HPI Kelsey Mendoza is a 67 y.o. female, right-handed, with a history of HTN presenting with right wrist pain and right knee pain after fall the patient was at rehab for her dog when she tripped over her dog and fell forward landing on her right arm and knee.  She did not lose consciousness or hit her head.  She did immediately become anxious, diaphoretic, and felt poorly due to significant pain in her right wrist.  EMS arrived and the patient was having what they described as a vasovagal episode with some hypotension to the 80s, bradycardia into the 50s.  Her symptoms began to improve and her blood pressure normalized with intravenous fluid.  The patient denies any numbness or tingling, hip pain, neck pain or back pain.  No nausea or vomiting, changes in vision or speech.   Past Medical History:  Diagnosis Date  . Cancer ([place])   . CPAP (continuous positive airway pressure) dependence   . Hypertension   . Knee pain     There are no active problems to display for this patient.   Past Surgical History:  Procedure Laterality Date  . ABDOMINAL HYSTERECTOMY    . BACK SURGERY    . BLADDER SURGERY    . KNEE SURGERY      Current Outpatient Rx  . Order #: [id number] Class: Print  . Order #: [id number] Class: Historical Med  . Order #: [id number] Class: Historical Med  . Order #: [id number] Class: Print  . Order #: [id number] Class: Historical Med  . Order #: [id number] Class: Historical Med  . Order #: [id number] Class: Historical Med  . Order #: [id number] Class: Historical Med  . Order #: [id number] Class: Historical Med  . Order #: [id number] Class: Historical Med  . Order #: [id number] Class: Normal  . Order #: [id number] Class: Historical Med  .  Order #: [id number] Class: Print  . Order #: [id number] Class: Historical Med  . Order #: [id number] Class: Normal  . Order #: [id number] Class: Historical Med  . Order #: [id number] Class: Historical Med    Allergies Patient has no known allergies.  Family History  Problem Relation Age of Onset  . Hypertension Mother   . Hypertension Father   . CAD Father   . Breast cancer Neg Hx     Social History Social History   Tobacco Use  . Smoking status: Former Research scientist (life sciences)  . Smokeless tobacco: Never Used  Substance Use Topics  . Alcohol use: No  . Drug use: No    Review of Systems Constitutional: No fever/chills.  No lightheadedness or syncope.  Positive mechanical fall without loss of consciousness. Eyes: No visual changes. ENT: No sore throat. No congestion or rhinorrhea. Cardiovascular: Denies chest pain. Denies palpitations. Respiratory: Denies shortness of breath.  No cough. Gastrointestinal: No abdominal pain.  No nausea, no vomiting.  No diarrhea.  No constipation. Genitourinary: Negative for dysuria. Musculoskeletal: Negative for back pain.  Negative for neck pain.  Positive for right wrist pain.  Positive for right knee discomfort. Skin: Negative for rash.  Positive for right wrist laceration. Neurological: Negative for headaches. No focal numbness, tingling or weakness.      ____________________________________________   PHYSICAL EXAM:  VITAL SIGNS: ED Triage Vitals  Enc Vitals Group     BP 06/05/17 1319 131/65     Pulse Rate 06/05/17 1319 (!)  59     Resp 06/05/17 1319 13     Temp 06/05/17 1319 97.7 F (36.5 C)     Temp Source 06/05/17 1319 Oral     SpO2 06/05/17 1319 96 %     Weight 06/05/17 1320 222 lb (100.7 kg)     Height 06/05/17 1320 5\' 5"  (1.651 m)     Head Circumference --      Peak Flow --      Pain Score 06/05/17 1320 10     Pain Loc --      Pain Edu? --      Excl. in [place]? --     Constitutional: Alert and oriented.  Mildly uncomfortable appearing but  in no acute distress. Answers questions appropriately. Eyes: Conjunctivae are normal.  EOMI. PERRLA.  No scleral icterus. Head: Atraumatic.  No battle sign. Nose: No congestion/rhinnorhea.  No swelling over the nose or septal hematoma. Mouth/Throat: Mucous membranes are moist.  No dental injury or malocclusion.  Neck: No stridor.  Supple.  No midline C-spine tenderness to palpation, step-offs or deformities.  Full range of motion without pain. Cardiovascular: Slow to normal rate 58-61 on my exam, regular rhythm. No murmurs, rubs or gallops.  No pain over the chest or palpable crepitus. Respiratory: Normal respiratory effort.  No accessory muscle use or retractions. Lungs CTAB.  No wheezes, rales or ronchi. Gastrointestinal: Obese.  Soft, nontender and nondistended.  No guarding or rebound.  No peritoneal signs. Musculoskeletal: Pelvis is stable.  Full range of motion of the bilateral hips knees and ankles without pain.  The patient does have a 5 x 5 inch area of speckled bruising over the right knee with mild effusion.  Full range of motion of the bilateral shoulders and elbows, left wrist without pain.  The right wrist has an obvious deformity.  The patient has a 1 x 1 cm circular abrasion with centrally missing skin through the subcutaneous fat with a small amount of muscle visible but no bone visible over the ulnar styloid.  5 out of 5 grip strength in the right arm.  Normal right radial pulse.  Sensation to light touch throughout the right upper extremity. Neurologic:  A&Ox3.  Speech is clear.  Face and smile are symmetric.  EOMI.  Moves all extremities well. Skin:  Skin is warm, dry and intact. No rash noted. Psychiatric: Mood is normal and affect is anxious.  The patient has normal speech and judgment  ____________________________________________   LABS (all labs ordered are listed, but only abnormal results are displayed)  Labs Reviewed  BASIC METABOLIC PANEL - Abnormal; Notable for the  following components:      Result Value   Potassium 3.3 (*)    Glucose, Bld 129 (*)    Creatinine, Ser 1.33 (*)    Calcium 8.8 (*)    GFR calc non Af Amer 40 (*)    GFR calc Af Amer 46 (*)    All other components within normal limits  CBC - Abnormal; Notable for the following components:   WBC 11.7 (*)    All other components within normal limits  GLUCOSE, CAPILLARY - Abnormal; Notable for the following components:   Glucose-Capillary 117 (*)    All other components within normal limits  PROTIME-INR  APTT  CBG MONITORING, ED  TYPE AND SCREEN   ____________________________________________  EKG  ED ECG REPORT I, Eula Listen, the attending physician, personally viewed and interpreted this ECG.   Date: 06/05/2017  EKG Time:  1342  Rate: 60  Rhythm: normal sinus rhythm  Axis: normal  Intervals:none  ST&T Change: No STEMI  ____________________________________________  RADIOLOGY  Dg Wrist Complete Right  Result Date: 06/05/2017 CLINICAL DATA:  Wrist pain after fall EXAM: RIGHT WRIST - COMPLETE 3+ VIEW COMPARISON:  None. FINDINGS: Acute displaced ulnar styloid process fracture. Acute, comminuted intra-articular fracture of the distal radius with impaction. One bone with of volar displacement of the distal radius fracture fragment, wrist and hand with respect to the distal shaft of the radius and ulna. About 1.5 cm of overriding of the fracture fragments. Soft tissue swelling is present IMPRESSION: 1. Comminuted, acute and displaced intra-articular distal radius fracture. Distal radius fracture fragment, wrist and hand are positioned volar to the distal shaft of the radius and ulna and there is overriding of the carpal bones and shafts of the distal radius and ulna 2. Acute displaced ulnar styloid process fracture. Electronically Signed   By: Donavan Foil M.D.   On: 06/05/2017 14:16   Dg Knee Complete 4 Views Right  Result Date: 06/05/2017 CLINICAL DATA:  Fall with knee  pain EXAM: RIGHT KNEE - COMPLETE 4+ VIEW COMPARISON:  MRI 11/03/2016 FINDINGS: Status post medial right hemiarthroplasty with intact hardware and normal alignment. No acute displaced fracture is seen. Mild patellofemoral degenerative changes. No large knee effusion. IMPRESSION: Status post medial hemiarthroplasty of the right knee. No acute osseous abnormality. Electronically Signed   By: Donavan Foil M.D.   On: 06/05/2017 14:19    ____________________________________________   PROCEDURES  Procedure(s) performed: None  Procedures  Critical Care performed: No ____________________________________________   INITIAL IMPRESSION / ASSESSMENT AND PLAN / ED COURSE  Pertinent labs & imaging results that were available during my care of the patient were reviewed by me and considered in my medical decision making (see chart for details).  67 y.o. female status post mechanical fall onto her right wrist with deformity, right knee with overlying ecchymosis but no loss of consciousness.  Overall, the patient does have some diaphoresis with bradycardia; this is likely due to a vasovagal episode as it started after she had severe pain and did not precede her falling episode.  There is no clinical history that would be suggestive of presyncope or syncope.  Her EKG does not show any evidence of arrhythmia and here her vital signs are normal.  We will get an x-ray of her right wrist, and right knee as well as basic laboratory studies.  The patient has been treated with fentanyl and her pain has Artie significantly improved.  She is up-to-date on her tetanus booster.  The patient's laceration will require loosely approximated suture repair as there is a large area of skin missing and it would pucker with close approximation.  Plan reevaluation for final disposition.  ----------------------------------------- 2:19 PM on 06/05/2017 -----------------------------------------  Patient has a comminuted acute  displaced intra-articular distal radius fracture as well as ulnar styloid process fracture.  It does appear that this fracture is open.  I spoke with Dr. Mack Guise, the orthopedist on-call.  Ancef has been administered and Dr. Christia Reading will evaluate the patient soon as he is able to come over from clinic.  At this time, the patient reports some mildly increasing pain so I have put in orders for additional pain medication.  The patient remains n.p.o. and hemodynamically stable. ____________________________________________  FINAL CLINICAL IMPRESSION(S) / ED DIAGNOSES  Final diagnoses:  Laceration of right wrist, initial encounter  Displaced comminuted fracture of shaft of radius, right  arm, initial encounter for open fracture type I or II  Type I or II open fracture of shaft of right radius with ulna, initial encounter  Sinus bradycardia  Hypotension, unspecified hypotension type  Hypokalemia         NEW MEDICATIONS STARTED DURING THIS VISIT:  New Prescriptions   OXYCODONE-ACETAMINOPHEN (PERCOCET) 5-325 MG TABLET    Take 1 tablet by mouth every 6 (six) hours as needed for severe pain.      Eula Listen, MD 06/05/17 1444    Eula Listen, MD 06/05/17 [phone]

## 2017-06-05 NOTE — ED Notes (Signed)
Pt placed on 2L via [place] at this time. Pt desat to 88% on RA after receiving Fentanyl IV. Will continue to montior for further patient needs.

## 2017-06-05 NOTE — ED Notes (Signed)
Dr. Krasinski at bedside at this time.  

## 2017-06-05 NOTE — ED Notes (Signed)
Patient reports feeling nauseated and "groggy". Declined any pain medication despite being 5-6/10 for pain to right wrist. Patient advised to notify this RN if pain medication was needed or wanted. Water given. No other needs at this time.

## 2017-06-05 NOTE — Discharge Instructions (Signed)
Please avoid soaking your wrist to prevent problems with your sutures.  You may put bacitracin, Neosporin or any triple antibiotic cream over your laceration and stitches 3 times daily and a thick coat to prevent infection.  If you develop swelling, pus drainage, significant pain around the site, please seek immediate medical attention as this may be a sign of an infection.  You may elevate your right knee and ice it for 15 minutes every 2 hours to decrease pain and swelling.  You may take Tylenol or Motrin for mild to moderate pain, and Percocet is for severe pain.    Do not drive until you are cleared by the orthopedist to do so.

## 2017-06-05 NOTE — ED Triage Notes (Signed)
Pt presents to ED via [facility] with c/o mechanical fall, pt was at a dog washing facility and tripped over one of her dogs. Per EMS upon arrival pt was pale, diaphoretic, bradycardic and hypotensive. Per EMS pt did not hit her head, no LOC. Pt c/p pain to R knee, R breast, and has obvious deformity to R wrist, pt presents with wrist splint in place by EMS. Pt received 500cc's NS via 18g to L AC.

## 2017-06-05 NOTE — ED Notes (Signed)
Pt placed on 2L via [place], due to O2 sats 89% after receiving 137mcg Fentanyl, MD made aware.

## 2017-06-05 NOTE — ED Notes (Signed)
Pt's wound to R outer wrist cleaned with NS. Xeroform gauze applied to wound with 4x4's for padding. Volar splint placed by this RN as discussed with Dr. Mack Guise. Cap refill < 3 seconds, pt maintains limited movement and sensation after splint applied. Pt instructed for numbness/color change to tips of fingers to notify staff immediately. Pt states understanding.

## 2017-06-05 NOTE — H&P (Signed)
PREOPERATIVE H&P  Chief Complaint: distal radial fracture  HPI: Kelsey Mendoza is a 67 y.o. female who is being admitted to the orthopaedic service for a distal radial fracture with significant displacement and associated dorsal lateral wrist wound. The distal radius fracture is comminuted, intra-articular and significantly displaced. It requires surgical intervention.  Past Medical History:  Diagnosis Date  . Cancer ([place])   . CPAP (continuous positive airway pressure) dependence   . Hypertension   . Knee pain    Past Surgical History:  Procedure Laterality Date  . ABDOMINAL HYSTERECTOMY    . BACK SURGERY    . BLADDER SURGERY    . KNEE SURGERY     Social History   Socioeconomic History  . Marital status: Widowed    Spouse name: None  . Number of children: None  . Years of education: None  . Highest education level: None  Social Needs  . Financial resource strain: None  . Food insecurity - worry: None  . Food insecurity - inability: None  . Transportation needs - medical: None  . Transportation needs - non-medical: None  Occupational History  . None  Tobacco Use  . Smoking status: Former Research scientist (life sciences)  . Smokeless tobacco: Never Used  Substance and Sexual Activity  . Alcohol use: No  . Drug use: No  . Sexual activity: None  Other Topics Concern  . None  Social History Narrative  . None   Family History  Problem Relation Age of Onset  . Hypertension Mother   . Hypertension Father   . CAD Father   . Breast cancer Neg Hx    No Known Allergies Prior to Admission medications   Medication Sig Start Date End Date Taking? Authorizing Provider  amLODipine (NORVASC) 5 MG tablet Take 1 tablet (5 mg total) by mouth daily. 08/16/16 08/16/17 Yes Merlyn Lot, MD  cetirizine (ZYRTEC) 10 MG tablet Take 5 mg by mouth daily.   Yes [provider]  Cholecalciferol (VITAMIN D3) 10000 units TABS Take 10,000 Units by mouth daily.   Yes [provider]  furosemide  (LASIX) 40 MG tablet Take 40 mg by mouth daily as needed for fluid.    Yes [provider]  gabapentin (NEURONTIN) 100 MG capsule Take 100 mg by mouth at bedtime.   Yes [provider]  hydrocortisone 2.5 % cream Apply topically 2 (two) times daily.   Yes [provider]  hydrOXYzine (ATARAX/VISTARIL) 10 MG tablet Take 10 mg by mouth at bedtime.   Yes [provider]  ketoconazole (NIZORAL) 2 % shampoo Apply 1 application topically 2 (two) times a week.   Yes [provider]  losartan (COZAAR) 100 MG tablet Take 100 mg by mouth daily.   Yes [provider]  rosuvastatin (CRESTOR) 5 MG tablet Take 5 mg by mouth at bedtime.    Yes [provider]  triamterene-hydrochlorothiazide (MAXZIDE-25) 37.5-25 MG tablet Take 1 tablet by mouth daily.   Yes [provider]  urea (CARMOL) 20 % cream Apply topically daily as needed. For dry skin on feet   Yes [provider]  oxyCODONE-acetaminophen (PERCOCET) 5-325 MG tablet Take 1 tablet by mouth every 6 (six) hours as needed for severe pain. 06/05/17   Eula Listen, MD     Positive ROS: All other systems have been reviewed and were otherwise negative with the exception of those mentioned in the HPI and as above.  Physical Exam: General: Alert, no acute distress  MUSCULOSKELETAL: Right wrist:  Patient has swelling and a radial deviation deformity of the right wrist. She has a proximal a 10 mm diameter abrasion on the dorsal aspect of her right wrist over the ulnar head. No bone is visible. Patient can flex and extend all 5 digits of the right hand. Her fingers are well-perfused and she has intact sensation to light touch in all 5 digits.  Assessment: Right distal radial fracture, with wound over dorsal lateral wrist  Plan: Plan for Procedure(s): OPEN REDUCTION INTERNAL FIXATION (ORIF) DISTAL RADIAL FRACTURE  I discussed with the patient the details of surgery and the  post-operative course.  I discussed the risks and benefits of surgery. She understands the risks include but are not limited to infection, bleeding requiring blood transfusion, nerve or blood vessel injury, joint stiffness or loss of motion, persistent pain, weakness or instability, malunion, nonunion and hardware failure and the need for further surgery. Medical risks include but are not limited to DVT and pulmonary embolism, myocardial infarction, stroke, pneumonia, respiratory failure and death. Patient understood these risks and wished to proceed.   Patient will be evaluated by the hospitalist service for pre-op clearance.  I reviewed the patient's radiographs, CT scan and laboratory studies in preparation for this case.    [name], MD   06/05/2017 6:17 PM

## 2017-06-06 ENCOUNTER — Encounter: Payer: Self-pay | Admitting: Internal Medicine

## 2017-06-06 ENCOUNTER — Observation Stay: Payer: Medicare Other

## 2017-06-06 ENCOUNTER — Observation Stay: Payer: Medicare Other | Admitting: Anesthesiology

## 2017-06-06 ENCOUNTER — Encounter
Admission: EM | Disposition: A | Discharge status: Home / Self Care | Payer: Self-pay | Source: Home / Self Care | Attending: Emergency Medicine

## 2017-06-06 DIAGNOSIS — S52351B Displaced comminuted fracture of shaft of radius, right arm, initial encounter for open fracture type I or II: Secondary | ICD-10-CM | POA: Diagnosis not present

## 2017-06-06 HISTORY — PX: CARPAL TUNNEL RELEASE: SHX101

## 2017-06-06 HISTORY — PX: OPEN REDUCTION INTERNAL FIXATION (ORIF) DISTAL RADIAL FRACTURE: SHX5989

## 2017-06-06 HISTORY — PX: I & D EXTREMITY: SHX5045

## 2017-06-06 LAB — BASIC METABOLIC PANEL
Anion gap: 7 (ref 5–15)
BUN: 13 mg/dL (ref 6–20)
CO2: 22 mmol/L (ref 22–32)
Calcium: 8.5 mg/dL — ABNORMAL LOW (ref 8.9–10.3)
Chloride: 108 mmol/L (ref 101–111)
Creatinine, Ser: 1.04 mg/dL — ABNORMAL HIGH (ref 0.44–1.00)
GFR calc Af Amer: >60 mL/min (ref >60)
GFR, EST NON AFRICAN AMERICAN: 54 mL/min — AB (ref >60)
GLUCOSE: 143 mg/dL — AB (ref 65–99)
Potassium: 4.6 mmol/L (ref 3.5–5.1)
SODIUM: 137 mmol/L (ref 135–145)

## 2017-06-06 LAB — CBC
HCT: 38.1 % (ref 35.0–47.0)
Hemoglobin: 12.4 g/dL (ref 12.0–16.0)
MCH: 28.5 pg (ref 26.0–34.0)
MCHC: 32.7 g/dL (ref 32.0–36.0)
MCV: 87.2 fL (ref 80.0–100.0)
PLATELETS: 195 10*3/uL (ref 150–440)
RBC: 4.36 MIL/uL (ref 3.80–5.20)
RDW: 13.2 % (ref 11.5–14.5)
WBC: 11.4 10*3/uL — AB (ref 3.6–11.0)

## 2017-06-06 LAB — SURGICAL PCR SCREEN
MRSA, PCR: NEGATIVE
STAPHYLOCOCCUS AUREUS: POSITIVE — AB

## 2017-06-06 SURGERY — OPEN REDUCTION INTERNAL FIXATION (ORIF) DISTAL RADIUS FRACTURE
Anesthesia: General | Laterality: Right

## 2017-06-06 MED ORDER — DIPHENHYDRAMINE HCL 12.5 MG/5ML PO ELIX: 12.5000 mg | ORAL_SOLUTION | ORAL | Status: DC | PRN

## 2017-06-06 MED ORDER — HYDRALAZINE HCL 20 MG/ML IJ SOLN: 10.0000 mg | Freq: Four times a day (QID) | INTRAMUSCULAR | Status: DC | PRN

## 2017-06-06 MED ORDER — FUROSEMIDE 40 MG PO TABS: 40.0000 mg | ORAL_TABLET | Freq: Every day | ORAL | Status: DC | PRN

## 2017-06-06 MED ORDER — HYDROMORPHONE HCL 1 MG/ML IJ SOLN
INTRAMUSCULAR | Status: AC
  Filled 2017-06-06: qty 1

## 2017-06-06 MED ORDER — MENTHOL 3 MG MT LOZG
1.0000 | LOZENGE | OROMUCOSAL | Status: DC | PRN
  Filled 2017-06-06: qty 9

## 2017-06-06 MED ORDER — LIDOCAINE HCL (CARDIAC) 20 MG/ML IV SOLN
INTRAVENOUS | Status: DC | PRN
  Administered 2017-06-06: 80 mg via INTRAVENOUS

## 2017-06-06 MED ORDER — LORATADINE 10 MG PO TABS
10.0000 mg | ORAL_TABLET | Freq: Every day | ORAL | Status: DC
  Administered 2017-06-07: 10 mg via ORAL
  Filled 2017-06-06: qty 1

## 2017-06-06 MED ORDER — HYDROMORPHONE HCL 1 MG/ML IJ SOLN: 0.2500 mg | INTRAMUSCULAR | Status: DC | PRN

## 2017-06-06 MED ORDER — ONDANSETRON HCL 4 MG/2ML IJ SOLN: 4.0000 mg | Freq: Four times a day (QID) | INTRAMUSCULAR | Status: DC | PRN

## 2017-06-06 MED ORDER — DEXAMETHASONE SODIUM PHOSPHATE 10 MG/ML IJ SOLN
INTRAMUSCULAR | Status: DC | PRN
  Administered 2017-06-06: 10 mg via INTRAVENOUS

## 2017-06-06 MED ORDER — ONDANSETRON HCL 4 MG/2ML IJ SOLN
INTRAMUSCULAR | Status: DC | PRN
  Administered 2017-06-06: 4 mg via INTRAVENOUS

## 2017-06-06 MED ORDER — PROMETHAZINE HCL 25 MG/ML IJ SOLN
12.5000 mg | Freq: Four times a day (QID) | INTRAMUSCULAR | Status: DC | PRN
  Administered 2017-06-06: 12.5 mg via INTRAVENOUS
  Filled 2017-06-06: qty 1

## 2017-06-06 MED ORDER — AMLODIPINE BESYLATE 5 MG PO TABS
5.0000 mg | ORAL_TABLET | Freq: Every day | ORAL | Status: DC
  Administered 2017-06-07: 5 mg via ORAL
  Filled 2017-06-06: qty 1

## 2017-06-06 MED ORDER — SODIUM CHLORIDE 0.9 % IV SOLN
INTRAVENOUS | Status: DC
  Administered 2017-06-06: 16:00:00 via INTRAVENOUS

## 2017-06-06 MED ORDER — METHOCARBAMOL 1000 MG/10ML IJ SOLN
500.0000 mg | Freq: Four times a day (QID) | INTRAVENOUS | Status: DC | PRN
  Filled 2017-06-06: qty 5

## 2017-06-06 MED ORDER — DEXMEDETOMIDINE HCL 200 MCG/2ML IV SOLN
INTRAVENOUS | Status: DC | PRN
  Administered 2017-06-06 (×2): 8 ug via INTRAVENOUS
  Administered 2017-06-06: 4 ug via INTRAVENOUS
  Administered 2017-06-06 (×3): 8 ug via INTRAVENOUS

## 2017-06-06 MED ORDER — DEXAMETHASONE SODIUM PHOSPHATE 10 MG/ML IJ SOLN
INTRAMUSCULAR | Status: AC
  Filled 2017-06-06: qty 1

## 2017-06-06 MED ORDER — BUPIVACAINE HCL 0.5 % IJ SOLN
INTRAMUSCULAR | Status: DC | PRN
  Administered 2017-06-06: 14 mL

## 2017-06-06 MED ORDER — METHOCARBAMOL 500 MG PO TABS
500.0000 mg | ORAL_TABLET | Freq: Four times a day (QID) | ORAL | Status: DC | PRN
  Filled 2017-06-06: qty 1

## 2017-06-06 MED ORDER — MIDAZOLAM HCL 2 MG/2ML IJ SOLN
INTRAMUSCULAR | Status: DC | PRN
  Administered 2017-06-06: 2 mg via INTRAVENOUS

## 2017-06-06 MED ORDER — BISACODYL 10 MG RE SUPP: 10.0000 mg | Freq: Every day | RECTAL | Status: DC | PRN

## 2017-06-06 MED ORDER — HYDROMORPHONE HCL 1 MG/ML IJ SOLN
INTRAMUSCULAR | Status: DC | PRN
  Administered 2017-06-06: 1 mg via INTRAVENOUS

## 2017-06-06 MED ORDER — KETOCONAZOLE 2 % EX SHAM: 1.0000 "application " | MEDICATED_SHAMPOO | CUTANEOUS | Status: DC

## 2017-06-06 MED ORDER — GLYCOPYRROLATE 0.2 MG/ML IJ SOLN
INTRAMUSCULAR | Status: DC | PRN
  Administered 2017-06-06: 0.1 mg via INTRAVENOUS

## 2017-06-06 MED ORDER — NEOMYCIN-POLYMYXIN B GU 40-200000 IR SOLN
Status: AC
  Filled 2017-06-06: qty 2

## 2017-06-06 MED ORDER — PROPOFOL 10 MG/ML IV BOLUS
INTRAVENOUS | Status: DC | PRN
  Administered 2017-06-06: 150 mg via INTRAVENOUS
  Administered 2017-06-06: 50 mg via INTRAVENOUS

## 2017-06-06 MED ORDER — PHENOL 1.4 % MT LIQD
1.0000 | OROMUCOSAL | Status: DC | PRN
  Filled 2017-06-06: qty 177

## 2017-06-06 MED ORDER — TRIAMTERENE-HCTZ 37.5-25 MG PO TABS
1.0000 | ORAL_TABLET | Freq: Every day | ORAL | Status: DC
  Administered 2017-06-07: 1 via ORAL
  Filled 2017-06-06 (×2): qty 1

## 2017-06-06 MED ORDER — ACETAMINOPHEN 10 MG/ML IV SOLN
INTRAVENOUS | Status: DC | PRN
  Administered 2017-06-06: 1000 mg via INTRAVENOUS

## 2017-06-06 MED ORDER — METOCLOPRAMIDE HCL 5 MG/ML IJ SOLN: 5.0000 mg | Freq: Three times a day (TID) | INTRAMUSCULAR | Status: DC | PRN

## 2017-06-06 MED ORDER — MEPERIDINE HCL 50 MG/ML IJ SOLN: 6.2500 mg | INTRAMUSCULAR | Status: DC | PRN

## 2017-06-06 MED ORDER — EPHEDRINE SULFATE 50 MG/ML IJ SOLN
INTRAMUSCULAR | Status: DC | PRN
  Administered 2017-06-06: 10 mg via INTRAVENOUS
  Administered 2017-06-06: 20 mg via INTRAVENOUS

## 2017-06-06 MED ORDER — ROSUVASTATIN CALCIUM 10 MG PO TABS
5.0000 mg | ORAL_TABLET | Freq: Every day | ORAL | Status: DC
  Administered 2017-06-06: 5 mg via ORAL
  Filled 2017-06-06: qty 1

## 2017-06-06 MED ORDER — POLYETHYLENE GLYCOL 3350 17 G PO PACK: 17.0000 g | PACK | Freq: Every day | ORAL | Status: DC | PRN

## 2017-06-06 MED ORDER — GABAPENTIN 100 MG PO CAPS
100.0000 mg | ORAL_CAPSULE | Freq: Every day | ORAL | Status: DC
  Administered 2017-06-06: 100 mg via ORAL
  Filled 2017-06-06: qty 1

## 2017-06-06 MED ORDER — HYDROXYZINE HCL 10 MG PO TABS
10.0000 mg | ORAL_TABLET | Freq: Every day | ORAL | Status: DC
  Administered 2017-06-06: 10 mg via ORAL
  Filled 2017-06-06 (×2): qty 1

## 2017-06-06 MED ORDER — UREA 20 % EX CREA
TOPICAL_CREAM | CUTANEOUS | Status: DC | PRN
  Filled 2017-06-06: qty 85

## 2017-06-06 MED ORDER — CEFAZOLIN SODIUM-DEXTROSE 1-4 GM/50ML-% IV SOLN
1.0000 g | Freq: Four times a day (QID) | INTRAVENOUS | Status: AC
  Administered 2017-06-06 – 2017-06-07 (×3): 1 g via INTRAVENOUS
  Filled 2017-06-06 (×3): qty 50

## 2017-06-06 MED ORDER — OXYCODONE HCL 5 MG PO TABS: 10.0000 mg | ORAL_TABLET | ORAL | Status: DC | PRN

## 2017-06-06 MED ORDER — LACTATED RINGERS IV SOLN
INTRAVENOUS | Status: DC | PRN
  Administered 2017-06-06: 12:00:00 via INTRAVENOUS

## 2017-06-06 MED ORDER — VITAMIN D 1000 UNITS PO TABS
10000.0000 [IU] | ORAL_TABLET | Freq: Every day | ORAL | Status: DC
  Administered 2017-06-07: 10000 [IU] via ORAL
  Filled 2017-06-06: qty 10

## 2017-06-06 MED ORDER — ZOLPIDEM TARTRATE 5 MG PO TABS: 5.0000 mg | ORAL_TABLET | Freq: Every evening | ORAL | Status: DC | PRN

## 2017-06-06 MED ORDER — OXYCODONE HCL 5 MG PO TABS
5.0000 mg | ORAL_TABLET | ORAL | Status: DC | PRN
  Administered 2017-06-06 – 2017-06-07 (×4): 5 mg via ORAL
  Filled 2017-06-06 (×4): qty 1

## 2017-06-06 MED ORDER — MIDAZOLAM HCL 2 MG/2ML IJ SOLN
INTRAMUSCULAR | Status: AC
  Filled 2017-06-06: qty 2

## 2017-06-06 MED ORDER — METOCLOPRAMIDE HCL 10 MG PO TABS: 5.0000 mg | ORAL_TABLET | Freq: Three times a day (TID) | ORAL | Status: DC | PRN

## 2017-06-06 MED ORDER — DIPHENHYDRAMINE HCL 50 MG/ML IJ SOLN
INTRAMUSCULAR | Status: AC
  Filled 2017-06-06: qty 1

## 2017-06-06 MED ORDER — LOSARTAN POTASSIUM 50 MG PO TABS
100.0000 mg | ORAL_TABLET | Freq: Every day | ORAL | Status: DC
  Administered 2017-06-07: 100 mg via ORAL
  Filled 2017-06-06: qty 2

## 2017-06-06 MED ORDER — PROMETHAZINE HCL 25 MG/ML IJ SOLN: 6.2500 mg | INTRAMUSCULAR | Status: DC | PRN

## 2017-06-06 MED ORDER — ALBUTEROL SULFATE HFA 108 (90 BASE) MCG/ACT IN AERS
INHALATION_SPRAY | RESPIRATORY_TRACT | Status: AC
  Filled 2017-06-06: qty 6.7

## 2017-06-06 MED ORDER — GLYCOPYRROLATE 0.2 MG/ML IJ SOLN
INTRAMUSCULAR | Status: AC
  Filled 2017-06-06: qty 1

## 2017-06-06 MED ORDER — BUPIVACAINE HCL (PF) 0.5 % IJ SOLN
INTRAMUSCULAR | Status: AC
  Filled 2017-06-06: qty 30

## 2017-06-06 MED ORDER — MUPIROCIN 2 % EX OINT
1.0000 "application " | TOPICAL_OINTMENT | Freq: Two times a day (BID) | CUTANEOUS | Status: DC
  Administered 2017-06-06: 1 via NASAL
  Filled 2017-06-06: qty 22

## 2017-06-06 MED ORDER — CHLORHEXIDINE GLUCONATE CLOTH 2 % EX PADS
6.0000 | MEDICATED_PAD | Freq: Every day | CUTANEOUS | Status: DC
  Administered 2017-06-06: 6 via TOPICAL

## 2017-06-06 MED ORDER — ONDANSETRON HCL 4 MG PO TABS
4.0000 mg | ORAL_TABLET | Freq: Four times a day (QID) | ORAL | Status: DC | PRN
  Administered 2017-06-07: 4 mg via ORAL
  Filled 2017-06-06: qty 1

## 2017-06-06 MED ORDER — ONDANSETRON HCL 4 MG/2ML IJ SOLN
INTRAMUSCULAR | Status: AC
  Filled 2017-06-06: qty 2

## 2017-06-06 MED ORDER — DOCUSATE SODIUM 100 MG PO CAPS
100.0000 mg | ORAL_CAPSULE | Freq: Two times a day (BID) | ORAL | Status: DC
  Administered 2017-06-06 – 2017-06-07 (×2): 100 mg via ORAL
  Filled 2017-06-06 (×2): qty 1

## 2017-06-06 MED ORDER — LIDOCAINE HCL (PF) 2 % IJ SOLN
INTRAMUSCULAR | Status: AC
  Filled 2017-06-06: qty 10

## 2017-06-06 MED ORDER — ALBUTEROL SULFATE HFA 108 (90 BASE) MCG/ACT IN AERS
INHALATION_SPRAY | RESPIRATORY_TRACT | Status: DC | PRN
Start: 2017-06-06 — End: 2017-06-06
  Administered 2017-06-06: 4 via RESPIRATORY_TRACT

## 2017-06-06 MED ORDER — ALUM & MAG HYDROXIDE-SIMETH 200-200-20 MG/5ML PO SUSP: 30.0000 mL | ORAL | Status: DC | PRN

## 2017-06-06 MED ORDER — ACETAMINOPHEN 325 MG PO TABS
650.0000 mg | ORAL_TABLET | Freq: Four times a day (QID) | ORAL | Status: DC | PRN
  Administered 2017-06-07: 650 mg via ORAL
  Filled 2017-06-06: qty 2

## 2017-06-06 MED ORDER — MORPHINE SULFATE (PF) 2 MG/ML IV SOLN: 2.0000 mg | INTRAVENOUS | Status: DC | PRN

## 2017-06-06 MED ORDER — NEOMYCIN-POLYMYXIN B GU 40-200000 IR SOLN
Status: DC | PRN
  Administered 2017-06-06: 2 mL

## 2017-06-06 MED ORDER — ACETAMINOPHEN NICU IV SYRINGE 10 MG/ML
INTRAVENOUS | Status: AC
  Filled 2017-06-06: qty 1

## 2017-06-06 MED ORDER — HYDROCORTISONE 2.5 % EX CREA
1.0000 "application " | TOPICAL_CREAM | Freq: Two times a day (BID) | CUTANEOUS | Status: DC
  Filled 2017-06-06: qty 30

## 2017-06-06 MED ORDER — MAGNESIUM CITRATE PO SOLN
1.0000 | Freq: Once | ORAL | Status: DC | PRN
  Filled 2017-06-06: qty 296

## 2017-06-06 MED ORDER — PROPOFOL 10 MG/ML IV BOLUS
INTRAVENOUS | Status: AC
  Filled 2017-06-06: qty 20

## 2017-06-06 MED ORDER — DEXMEDETOMIDINE HCL IN NACL 80 MCG/20ML IV SOLN
INTRAVENOUS | Status: AC
  Filled 2017-06-06: qty 20

## 2017-06-06 MED ORDER — EPHEDRINE SULFATE 50 MG/ML IJ SOLN
INTRAMUSCULAR | Status: AC
  Filled 2017-06-06: qty 1

## 2017-06-06 SURGICAL SUPPLY — 63 items
BANDAGE ACE 4X5 VEL STRL LF (GAUZE/BANDAGES/DRESSINGS) ×8 IMPLANT
BANDAGE ELASTIC 3 LF NS (GAUZE/BANDAGES/DRESSINGS) ×8 IMPLANT
BANDAGE ELASTIC 4 LF NS (GAUZE/BANDAGES/DRESSINGS) ×4 IMPLANT
BIT DRILL 2 FAST STEP (BIT) ×4 IMPLANT
BIT DRILL 2.5X4 QC (BIT) ×4 IMPLANT
BLADE MINI RND TIP GREEN BEAV (BLADE) ×4 IMPLANT
BNDG COHESIVE 4X5 TAN STRL (GAUZE/BANDAGES/DRESSINGS) ×4 IMPLANT
BNDG ESMARK 4X12 TAN STRL LF (GAUZE/BANDAGES/DRESSINGS) ×4 IMPLANT
BNDG PLASTER FAST 3X3 WHT LF (CAST SUPPLIES) ×8 IMPLANT
CANISTER SUCT 1200ML W/VALVE (MISCELLANEOUS) ×4 IMPLANT
CLOSURE WOUND 1/2 X4 (GAUZE/BANDAGES/DRESSINGS) ×2
CORD BIP STRL DISP 12FT (MISCELLANEOUS) ×8 IMPLANT
CUFF TOURN 18 STER (MISCELLANEOUS) ×4 IMPLANT
DRAPE FLUOR MINI C-ARM 54X84 (DRAPES) ×4 IMPLANT
DRAPE SURG 17X11 SM STRL (DRAPES) ×4 IMPLANT
DURAPREP 26ML APPLICATOR (WOUND CARE) ×4 IMPLANT
ELECT REM PT RETURN 9FT ADLT (ELECTROSURGICAL) ×4
ELECTRODE REM PT RTRN 9FT ADLT (ELECTROSURGICAL) ×2 IMPLANT
FORCEPS JEWEL BIP 4-3/4 STR (INSTRUMENTS) ×8 IMPLANT
GAUZE FLUFF 18X24 1PLY STRL (GAUZE/BANDAGES/DRESSINGS) ×4 IMPLANT
GAUZE PETRO XEROFOAM 1X8 (MISCELLANEOUS) ×4 IMPLANT
GAUZE SPONGE 4X4 12PLY STRL (GAUZE/BANDAGES/DRESSINGS) ×4 IMPLANT
GLOVE BIO SURGEON STRL SZ7.5 (GLOVE) ×4 IMPLANT
GLOVE BIOGEL PI IND STRL 9 (GLOVE) ×2 IMPLANT
GLOVE BIOGEL PI INDICATOR 9 (GLOVE) ×2
GLOVE INDICATOR 7.5 STRL GRN (GLOVE) ×4 IMPLANT
GLOVE SURG 9.0 ORTHO LTXF (GLOVE) ×4 IMPLANT
GOWN STRL REUS TWL 2XL XL LVL4 (GOWN DISPOSABLE) ×4 IMPLANT
GOWN STRL REUS W/ TWL LRG LVL3 (GOWN DISPOSABLE) ×2 IMPLANT
GOWN STRL REUS W/TWL LRG LVL3 (GOWN DISPOSABLE) ×2
HANDLE YANKAUER SUCT BULB TIP (MISCELLANEOUS) ×4 IMPLANT
KIT TURNOVER KIT A (KITS) ×4 IMPLANT
NEEDLE FILTER BLUNT 18X 1/2SAF (NEEDLE) ×2
NEEDLE FILTER BLUNT 18X1 1/2 (NEEDLE) ×2 IMPLANT
NS IRRIG 500ML POUR BTL (IV SOLUTION) ×4 IMPLANT
PACK EXTREMITY ARMC (MISCELLANEOUS) ×4 IMPLANT
PAD CAST CTTN 4X4 STRL (SOFTGOODS) ×4 IMPLANT
PAD PREP 24X41 OB/GYN DISP (PERSONAL CARE ITEMS) ×4 IMPLANT
PADDING CAST COTTON 4X4 STRL (SOFTGOODS) ×4
PEG 20MM SMOOTH WRIST P20 (Peg) ×8 IMPLANT
PEG FULLY THREADED 2.5X22MM (Peg) ×4 IMPLANT
PLATE STAN 21.6X57.2 NRRW RT (Plate) ×4 IMPLANT
SCREW BN 12X3.5XNS CORT TI (Screw) ×6 IMPLANT
SCREW CORT 3.5X12 (Screw) ×6 IMPLANT
SCREW MULTI DIRECT 20MM (Screw) ×4 IMPLANT
SCREW PEG LOCK 2.5X12 (Screw) ×4 IMPLANT
SCREW PEG LOCK 2.5X20 (Peg) ×4 IMPLANT
SLING ARM M TX990204 (SOFTGOODS) ×4 IMPLANT
SPLINT CAST 1 STEP 4X30 (MISCELLANEOUS) ×4 IMPLANT
STOCKINETTE STRL 4IN 9604848 (GAUZE/BANDAGES/DRESSINGS) ×4 IMPLANT
STRIP CLOSURE SKIN 1/2X4 (GAUZE/BANDAGES/DRESSINGS) ×6 IMPLANT
SUT ETHILON 4-0 (SUTURE) ×2
SUT ETHILON 4-0 FS2 18XMFL BLK (SUTURE) ×2
SUT ETHILON NAB PS2 4-0 18IN (SUTURE) ×8 IMPLANT
SUT MNCRL AB 4-0 PS2 18 (SUTURE) ×4 IMPLANT
SUT VIC AB 0 CT2 27 (SUTURE) ×4 IMPLANT
SUT VIC AB 3-0 SH 27 (SUTURE) ×2
SUT VIC AB 3-0 SH 27X BRD (SUTURE) ×2 IMPLANT
SUTURE ETHLN 4-0 FS2 18XMF BLK (SUTURE) ×2 IMPLANT
SYR 10ML LL (SYRINGE) ×4 IMPLANT
TAPE TRANSPORE STRL 2 31045 (GAUZE/BANDAGES/DRESSINGS) ×4 IMPLANT
TUBING CONNECTING 10 (TUBING) ×3 IMPLANT
TUBING CONNECTING 10' (TUBING) ×1

## 2017-06-06 NOTE — Consult Note (Signed)
Medical Consultation  Kelsey Mendoza ZSW:[id number] DOB: 08-Aug-1950 DOA: 06/05/2017 PCP: [name], MD   Requesting physician:  Dr Mack Guise Date of consultation: 06/06/2017 Reason for consultation: Medical clearance  Impression/Recommendations 67 year old female with history of hypertension and hyperlipidemia s/p mechanical fall and unfortunately has suffered Severely comminuted fracture of the distal radial metaphysis and epiphysis involving the articular surface.   1. Preoperative clearance: Patient is low risk for moderate risk procedure may proceed to surgery without further cardiac workup.  2. Acute hypoxic respiratory failure: Wean oxygen to room air Check chest x-ray. Continue ISS  3. OSA: Continue CPAP at night  4. Essential hypertension: Resume oral medications after procedure. Home medications include Norvasc, Lasix, losartan and Maxide When necessary hydralazine IV has been ordered  5. Hypokalemia: This has been repleted  6. Hyperlipidemia: Continue Crestor after surgery   Chief Complaint:  Right wrist pain  HPI:  67 year old female with a history of essential hypertension and hyperlipidemia who had a mechanical fall and now has suffered Severely comminuted fracture of the distal radial metaphysis and epiphysis involving the articular surface. Hospitalists were consulted for preop evaluation.  Patient denies syncope, chest pain, shortness of breath, abdominal pain. She has some nausea after IV pain medications. She reports that this was an accidental injury while she was taking her dogs to physical therapy.  Review of Systems  Constitutional: Negative for fever, chills weight loss HENT: Negative for ear pain, nosebleeds, congestion, facial swelling, rhinorrhea, neck pain, neck stiffness and ear discharge.   Respiratory: Negative for cough, shortness of breath, wheezing  Cardiovascular: Negative for chest pain, palpitations and leg swelling.   Gastrointestinal: Negative for heartburn, abdominal pain, vomiting, diarrhea or consitpation Genitourinary: Negative for dysuria, urgency, frequency, hematuria Musculoskeletal: Negative for back pain  Positive for right wrist pain   Neurological: Negative for dizziness, seizures, syncope, focal weakness,  numbness and headaches.  Hematological: Does not bruise/bleed easily.  Psychiatric/Behavioral: Negative for hallucinations, confusion, dysphoric mood   Past Medical History:  Diagnosis Date  . Cancer ([place])   . CPAP (continuous positive airway pressure) dependence   . Hypertension   . Knee pain    Past Surgical History:  Procedure Laterality Date  . ABDOMINAL HYSTERECTOMY    . BACK SURGERY    . BLADDER SURGERY    . KNEE SURGERY     Social History:  reports that she has quit smoking. she has never used smokeless tobacco. She reports that she does not drink alcohol or use drugs.  No Known Allergies Family History  Problem Relation Age of Onset  . Hypertension Mother   . Hypertension Father   . CAD Father   . Breast cancer Neg Hx     Prior to Admission medications   Medication Sig Start Date End Date Taking? Authorizing Provider  amLODipine (NORVASC) 5 MG tablet Take 1 tablet (5 mg total) by mouth daily. 08/16/16 08/16/17 Yes Merlyn Lot, MD  cetirizine (ZYRTEC) 10 MG tablet Take 5 mg by mouth daily.   Yes [provider]  Cholecalciferol (VITAMIN D3) 10000 units TABS Take 10,000 Units by mouth daily.   Yes [provider]  furosemide (LASIX) 40 MG tablet Take 40 mg by mouth daily as needed for fluid.    Yes [provider]  gabapentin (NEURONTIN) 100 MG capsule Take 100 mg by mouth at bedtime.   Yes [provider]  hydrocortisone 2.5 % cream Apply topically 2 (two) times daily.   Yes [provider]  hydrOXYzine (ATARAX/VISTARIL) 10 MG tablet Take 10 mg by mouth at bedtime.   Yes [provider]  ketoconazole  (NIZORAL) 2 % shampoo Apply 1 application topically 2 (two) times a week.   Yes [provider]  losartan (COZAAR) 100 MG tablet Take 100 mg by mouth daily.   Yes [provider]  rosuvastatin (CRESTOR) 5 MG tablet Take 5 mg by mouth at bedtime.    Yes [provider]  triamterene-hydrochlorothiazide (MAXZIDE-25) 37.5-25 MG tablet Take 1 tablet by mouth daily.   Yes [provider]  urea (CARMOL) 20 % cream Apply topically daily as needed. For dry skin on feet   Yes [provider]  oxyCODONE-acetaminophen (PERCOCET) 5-325 MG tablet Take 1 tablet by mouth every 6 (six) hours as needed for severe pain. 06/05/17   Eula Listen, MD    Physical Exam: Blood pressure (!) 112/55, pulse 61, temperature 97.7 F (36.5 C), temperature source Oral, resp. rate 18, height 5\' 5"  (1.651 m), weight 100.7 kg (222 lb), SpO2 93 %. @VITALS2 @ [employer]   06/05/17 1320  Weight: 100.7 kg (222 lb)    Intake/Output Summary (Last 24 hours) at 06/06/2017 0821 Last data filed at 06/06/2017 0444 Gross per 24 hour  Intake 1333.75 ml  Output -  Net 1333.75 ml     Constitutional: Appears well-developed and well-nourished. No distress. HENT: Normocephalic. Marland Kitchen Oropharynx is clear and moist.  Eyes: Conjunctivae and EOM are normal. PERRLA, no scleral icterus.  Neck: Normal ROM. Neck supple. No JVD. No tracheal deviation. CVS: RRR, S1/S2 +, no murmurs, no gallops, no carotid bruit.  Pulmonary: Effort and breath sounds normal, no stridor, rhonchi, wheezes, rales.  Abdominal: Soft. BS +,  no distension, tenderness, rebound or guarding.  Musculoskeletal: Right wrist is wrapped in Ace bandage No edema and no tenderness.  Neuro: Alert. CN 2-12 grossly intact. No focal deficits. Skin: Skin is warm and dry. No rash noted. Psychiatric: Normal mood and affect.    Labs  Basic Metabolic Panel: Recent Labs  Lab 06/06/17 0538  NA 137  K 4.6  CL 108  CO2 22  GLUCOSE  143*  BUN 13  CREATININE 1.04*  CALCIUM 8.5*   Liver Function Tests: No results for input(s): AST, ALT, ALKPHOS, BILITOT, PROT, ALBUMIN in the last 168 hours. No results for input(s): LIPASE, AMYLASE in the last 168 hours.  CBC: Recent Labs  Lab 06/06/17 0655  WBC 11.4*  HGB 12.4  HCT 38.1  MCV 87.2  PLT 195   Cardiac Enzymes: No results for input(s): CKTOTAL, CKMB, CKMBINDEX, TROPONINI in the last 168 hours. BNP: Invalid input(s): POCBNP CBG: Recent Labs  Lab 06/05/17 1411  GLUCAP 117*    Radiological Exams: Dg Wrist Complete Right  Result Date: 06/05/2017 CLINICAL DATA:  Wrist pain after fall EXAM: RIGHT WRIST - COMPLETE 3+ VIEW COMPARISON:  None. FINDINGS: Acute displaced ulnar styloid process fracture. Acute, comminuted intra-articular fracture of the distal radius with impaction. One bone with of volar displacement of the distal radius fracture fragment, wrist and hand with respect to the distal shaft of the radius and ulna. About 1.5 cm of overriding of the fracture fragments. Soft tissue swelling is present IMPRESSION: 1. Comminuted, acute and displaced intra-articular distal radius fracture. Distal radius fracture fragment, wrist and hand are positioned volar to the distal shaft of the radius and ulna and there is overriding of the carpal bones and shafts of the distal radius and ulna 2. Acute displaced ulnar styloid process  fracture. Electronically Signed   By: Donavan Foil M.D.   On: 06/05/2017 14:16   Ct Wrist Right Wo Contrast  Result Date: 06/05/2017 CLINICAL DATA:  Right wrist pain and right knee pain after fall the patient was at rehab for her dog when she tripped over her dog and fell forward landing on her right arm and knee. She did not lose consciousness or hit her head. She did immediately become anxious, diaphoretic, and felt poorly due to significant pain in her right wrist. EXAM: CT OF THE RIGHT WRIST WITHOUT CONTRAST TECHNIQUE: Multidetector CT imaging of  the right wrist was performed according to the standard protocol. Multiplanar CT image reconstructions were also generated. COMPARISON:  None. FINDINGS: Bones/Joint/Cartilage Severely comminuted fracture of the distal radial metaphysis and epiphysis involving the articular surface. 9 mm volar displacement of distal radial fracture fragment. Fracture of the base of the ulnar styloid process with 5 mm of displacement. Small locule of air adjacent to the fracture which may be secondary to an open fracture versus instrumentation. No other acute fracture or dislocation. 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 Mild osteoarthritis of the second [facility]MC joint. Ligaments Ligaments are suboptimally evaluated by CT. Muscles and Tendons Muscles are normal. Flexor and extensor compartment tendons are intact. Soft tissue Hemorrhagic contusion along the dorsal radial aspect of the wrist. No soft tissue mass. IMPRESSION: 1. Severely comminuted fracture of the distal radial metaphysis and epiphysis involving the articular surface. 9 mm volar displacement of distal radial fracture fragment. 2. Fracture of the base of the ulnar styloid process with 5 mm of displacement. Small locule of air adjacent to the fracture which may be secondary to an open fracture versus instrumentation. Electronically Signed   By: Kathreen Devoid   On: 06/05/2017 15:08   Dg Knee Complete 4 Views Right  Result Date: 06/05/2017 CLINICAL DATA:  Fall with knee pain EXAM: RIGHT KNEE - COMPLETE 4+ VIEW COMPARISON:  MRI 11/03/2016 FINDINGS: Status post medial right hemiarthroplasty with intact hardware and normal alignment. No acute displaced fracture is seen. Mild patellofemoral degenerative changes. No large knee effusion. IMPRESSION: Status post medial hemiarthroplasty of the right knee. No acute osseous abnormality. Electronically Signed   By: Donavan Foil M.D.   On: 06/05/2017 14:19    EKG: Normal sinus rhythm no ST elevation MI Plan discussed with  Dr. Mack Guise.  Thank you for allowing me to participate in the care of your patient. We will continue to follow.   Note: This dictation was prepared with Dragon dictation along with smaller phrase technology. Any transcriptional errors that result from this process are unintentional.  Time spent: 40 minutes  Zarinah Oviatt, MD

## 2017-06-06 NOTE — Progress Notes (Signed)
Subjective:  Patient seen in the pre-op area.  Patient reports right wrist pain as marked.  Complains of mild numbness of her thumb.  Her son is at the bedside.  Objective:   VITALS:   Vitals:   06/05/17 2235 06/06/17 0208 06/06/17 0844 06/06/17 1029  BP:  (!) 112/55 122/68 122/64  Pulse:  61 60 73  Resp:  18 18 18   Temp:  97.7 F (36.5 C) 98.4 F (36.9 C) 98 F (36.7 C)  TempSrc:  Oral Oral Tympanic  SpO2: 93% 93% 92% 95%  Weight:      Height:        PHYSICAL EXAM: Left upper extremity:  Patient moves all fingers.   Fingers are well perfused.  Intact sensation to light touch  Neurovascular intact Sensation intact distally  RRR CTA bilateral Abdomen:  + BS, soft, NT, ND HEENT: 2+ carotid pulses, no oral lesions PERRLA, EOMI  LABS  Results for orders placed or performed during the hospital encounter of 06/05/17 (from the past 24 hour(s))  Basic metabolic panel     Status: Abnormal   Collection Time: 06/05/17  1:23 PM  Result Value Ref Range   Sodium 140 135 - 145 mmol/L   Potassium 3.3 (L) 3.5 - 5.1 mmol/L   Chloride 107 101 - 111 mmol/L   CO2 24 22 - 32 mmol/L   Glucose, Bld 129 (H) 65 - 99 mg/dL   BUN 15 6 - 20 mg/dL   Creatinine, Ser 1.33 (H) 0.44 - 1.00 mg/dL   Calcium 8.8 (L) 8.9 - 10.3 mg/dL   GFR calc non Af Amer 40 (L) >60 mL/min   GFR calc Af Amer 46 (L) >60 mL/min   Anion gap 9 5 - 15  CBC     Status: Abnormal   Collection Time: 06/05/17  1:23 PM  Result Value Ref Range   WBC 11.7 (H) 3.6 - 11.0 K/uL   RBC 4.60 3.80 - 5.20 MIL/uL   Hemoglobin 13.0 12.0 - 16.0 g/dL   HCT 39.9 35.0 - 47.0 %   MCV 86.7 80.0 - 100.0 fL   MCH 28.2 26.0 - 34.0 pg   MCHC 32.5 32.0 - 36.0 g/dL   RDW 13.2 11.5 - 14.5 %   Platelets 232 150 - 440 K/uL  Protime-INR     Status: None   Collection Time: 06/05/17  1:23 PM  Result Value Ref Range   Prothrombin Time 12.0 11.4 - 15.2 seconds   INR 0.89   APTT     Status: None   Collection Time: 06/05/17  1:23 PM  Result  Value Ref Range   aPTT 25 24 - 36 seconds  Glucose, capillary     Status: Abnormal   Collection Time: 06/05/17  2:11 PM  Result Value Ref Range   Glucose-Capillary 117 (H) 65 - 99 mg/dL  Type and screen [place]     Status: None   Collection Time: 06/05/17  2:56 PM  Result Value Ref Range   ABO/RH(D) O POS    Antibody Screen NEG    Sample Expiration      06/08/2017 Performed at [facility], [address] [place], [place] [address]   Surgical pcr screen     Status: Abnormal   Collection Time: 06/05/17 10:38 PM  Result Value Ref Range   MRSA, PCR NEGATIVE NEGATIVE   Staphylococcus aureus POSITIVE (A) NEGATIVE  Basic metabolic panel     Status: Abnormal   Collection  Time: 06/06/17  5:38 AM  Result Value Ref Range   Sodium 137 135 - 145 mmol/L   Potassium 4.6 3.5 - 5.1 mmol/L   Chloride 108 101 - 111 mmol/L   CO2 22 22 - 32 mmol/L   Glucose, Bld 143 (H) 65 - 99 mg/dL   BUN 13 6 - 20 mg/dL   Creatinine, Ser 1.04 (H) 0.44 - 1.00 mg/dL   Calcium 8.5 (L) 8.9 - 10.3 mg/dL   GFR calc non Af Amer 54 (L) >60 mL/min   GFR calc Af Amer >60 >60 mL/min   Anion gap 7 5 - 15  CBC     Status: Abnormal   Collection Time: 06/06/17  6:55 AM  Result Value Ref Range   WBC 11.4 (H) 3.6 - 11.0 K/uL   RBC 4.36 3.80 - 5.20 MIL/uL   Hemoglobin 12.4 12.0 - 16.0 g/dL   HCT 38.1 35.0 - 47.0 %   MCV 87.2 80.0 - 100.0 fL   MCH 28.5 26.0 - 34.0 pg   MCHC 32.7 32.0 - 36.0 g/dL   RDW 13.2 11.5 - 14.5 %   Platelets 195 150 - 440 K/uL    Dg Chest 1 View  Result Date: 06/06/2017 CLINICAL DATA:  Acute hypoxia. EXAM: CHEST 1 VIEW COMPARISON:  No comparison film available for review at this time. FINDINGS: 0841 hours. Asymmetric elevation right hemidiaphragm. The lungs are clear without focal pneumonia, edema, pneumothorax or pleural effusion. Subsegmental atelectasis noted right lung base. Cardiopericardial silhouette is at upper limits of normal for size. The visualized  bony structures of the thorax are intact. IMPRESSION: Subsegmental atelectasis at the right lung base. Otherwise no acute cardiopulmonary findings. Electronically Signed   By: Misty Stanley M.D.   On: 06/06/2017 10:48   Dg Wrist Complete Right  Result Date: 06/05/2017 CLINICAL DATA:  Wrist pain after fall EXAM: RIGHT WRIST - COMPLETE 3+ VIEW COMPARISON:  None. FINDINGS: Acute displaced ulnar styloid process fracture. Acute, comminuted intra-articular fracture of the distal radius with impaction. One bone with of volar displacement of the distal radius fracture fragment, wrist and hand with respect to the distal shaft of the radius and ulna. About 1.5 cm of overriding of the fracture fragments. Soft tissue swelling is present IMPRESSION: 1. Comminuted, acute and displaced intra-articular distal radius fracture. Distal radius fracture fragment, wrist and hand are positioned volar to the distal shaft of the radius and ulna and there is overriding of the carpal bones and shafts of the distal radius and ulna 2. Acute displaced ulnar styloid process fracture. Electronically Signed   By: Donavan Foil M.D.   On: 06/05/2017 14:16   Ct Wrist Right Wo Contrast  Result Date: 06/05/2017 CLINICAL DATA:  Right wrist pain and right knee pain after fall the patient was at rehab for her dog when she tripped over her dog and fell forward landing on her right arm and knee. She did not lose consciousness or hit her head. She did immediately become anxious, diaphoretic, and felt poorly due to significant pain in her right wrist. EXAM: CT OF THE RIGHT WRIST WITHOUT CONTRAST TECHNIQUE: Multidetector CT imaging of the right wrist was performed according to the standard protocol. Multiplanar CT image reconstructions were also generated. COMPARISON:  None. FINDINGS: Bones/Joint/Cartilage Severely comminuted fracture of the distal radial metaphysis and epiphysis involving the articular surface. 9 mm volar displacement of distal radial  fracture fragment. Fracture of the base of the ulnar styloid process with 5 mm of displacement.  Small locule of air adjacent to the fracture which may be secondary to an open fracture versus instrumentation. No other acute fracture or dislocation. 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 Mild osteoarthritis of the second [facility]MC joint. Ligaments Ligaments are suboptimally evaluated by CT. Muscles and Tendons Muscles are normal. Flexor and extensor compartment tendons are intact. Soft tissue Hemorrhagic contusion along the dorsal radial aspect of the wrist. No soft tissue mass. IMPRESSION: 1. Severely comminuted fracture of the distal radial metaphysis and epiphysis involving the articular surface. 9 mm volar displacement of distal radial fracture fragment. 2. Fracture of the base of the ulnar styloid process with 5 mm of displacement. Small locule of air adjacent to the fracture which may be secondary to an open fracture versus instrumentation. Electronically Signed   By: Kathreen Devoid   On: 06/05/2017 15:08   Dg Knee Complete 4 Views Right  Result Date: 06/05/2017 CLINICAL DATA:  Fall with knee pain EXAM: RIGHT KNEE - COMPLETE 4+ VIEW COMPARISON:  MRI 11/03/2016 FINDINGS: Status post medial right hemiarthroplasty with intact hardware and normal alignment. No acute displaced fracture is seen. Mild patellofemoral degenerative changes. No large knee effusion. IMPRESSION: Status post medial hemiarthroplasty of the right knee. No acute osseous abnormality. Electronically Signed   By: Donavan Foil M.D.   On: 06/05/2017 14:19    Assessment/Plan: Day of Surgery   Active Problems:   Distal radius fracture, right  Patient is NPO.  She has been cleared by hospitalist for surgery.  I reviewed the details of the surgery and the post-op course with the patient and her son.  I answered all their questions.  I marked the right UE consistent with the hospital policy.   I reviewed again the risks of benefits of  surgery with the patient and her son.  Patient and her son understand these risks and wished to proceed.      [name] , MD 06/06/2017, 11:43 AM

## 2017-06-06 NOTE — Anesthesia Post-op Follow-up Note (Signed)
Anesthesia QCDR form completed.        

## 2017-06-06 NOTE — Care Management Obs Status (Signed)
[facility] NOTIFICATION   Patient Details  Name: PRISCILLIA FOUCH MRN: [id number] Date of Birth: July 11, 1950   Medicare Observation Status Notification Given:  Yes    Jolly Mango, RN 06/06/2017, 9:45 AM

## 2017-06-06 NOTE — Transfer of Care (Signed)
Immediate Anesthesia Transfer of Care Note  Patient: Kelsey Mendoza  Procedure(s) Performed: OPEN REDUCTION INTERNAL FIXATION (ORIF) DISTAL RADIAL FRACTURE (N/A ) CARPAL TUNNEL RELEASE (Right ) IRRIGATION AND DEBRIDEMENT EXTREMITY--RIGHT ARM (Right )  Patient Location: PACU  Anesthesia Type:General  Level of Consciousness: drowsy and patient cooperative  Airway & Oxygen Therapy: Patient Spontanous Breathing and Patient connected to face mask oxygen  Post-op Assessment: Report given to RN, Post -op Vital signs reviewed and stable and Patient moving all extremities X 4  Post vital signs: Reviewed and stable  Last Vitals:  Vitals:   06/06/17 1029 06/06/17 1352  BP: 122/64   Pulse: 73   Resp: 18   Temp: 36.7 C (!) (P) 36.4 C  SpO2: 95%     Last Pain:  Vitals:   06/06/17 1029  TempSrc: Tympanic  PainSc: 8       Patients Stated Pain Goal: 2 (48/18/56 3149)  Complications: No apparent anesthesia complications

## 2017-06-06 NOTE — Op Note (Signed)
06/05/2017 - 06/06/2017  1:56 PM  PATIENT:  Kelsey Mendoza    PRE-OPERATIVE DIAGNOSIS:  distal radial fracture  POST-OPERATIVE DIAGNOSIS:  Same  PROCEDURE:  OPEN REDUCTION INTERNAL FIXATION (ORIF) DISTAL RADIAL FRACTURE, CARPAL TUNNEL RELEASE, IRRIGATION AND DEBRIDEMENT EXTREMITY--RIGHT ARM  SURGEON:  [name], MD   ASSISTANT:  Earnestine Leys, MD  ANESTHESIA:   General  PREOPERATIVE INDICATIONS:  AALEYAH WITHEROW is a  67 y.o. female with a diagnosis of distal radial fracture who failed conservative measures and elected for surgical management.    The risks benefits and alternatives were discussed with the patient preoperatively including but not limited to the risks of infection, bleeding, nerve injury, malunion, nonunion, wrist stiffness, persistent wrist pain, osteoarthritis and the need for further surgery. Medical risks include but are not limited to DVT and pulmonary embolism, myocardial infarction, stroke, pneumonia, respiratory failure and death. Patient and her husband understood these risks and wished to proceed.   OPERATIVE IMPLANTS: [employer] hand innovations plate  OPERATIVE FINDINGS: Comminuted distal radius fracture with volar displacement and a tight carpal tunnel  OPERATIVE PROCEDURE: Patient was seen in the preoperative area. I marked the right hand with the word yes and my initials according the hospital's correct site of surgery protocol. I answered all questions by the patient and her son. Patient was then brought to the operating room where she was placed supine on the operative table. She underwent general anesthesia.   The right arm was prepped and draped in a sterile fashion. A timeout performed to verify the patient's name, date of birth, medical record number, correct site of surgery correct procedure to be performed. The timeout was also used a timeout to verify patient received antibiotics and appropriate instruments, implants and radiographs studies were  available in the room. Once all in attendance were in agreement case began.   Patient then had the operative extremity exsanguinated with an Esmarch. The tourniquet was placed on the right upper extremity and inflated 250 mm.  Patient had her index and middle fingers placed in wire traps and 2 pound weight was placed on these digits for traction.   The fracture reduced with traction.. The fracture reduction was confirmed on FluoroScan imaging.  A linear incision was then made over the FCR tendon. The subcutaneous tissue was carefully dissected using Metzenbaum scissor and Adson pickup. Retractors were used to protect the radial artery and median nerve. The pronator quadratus was identified and incised and elevated off the volar surface of the distal radius. A narrow 3-hole [employer] volar plate was then positioned on the under surface of the distal radius. A single screw was then placed in the oblong hole.   The position of the plate was confirmed on AP and lateral images.   The shaft screw was 12 mm in length. The attention was then turned to the distal pegs. The distal row of smooth pegs was placed first, both 11mm in length. Each individual peg hole was drilled and then measured with a depth gauge. The proximal row had fully threaded pegs placed, also 20 mm in length.  A multiaxial 20 mm screw was placed for fixation of the radial styloid.  The position and length of all screws were confirmed on AP and lateral FluoroScan imaging. Care was taken to avoid penetration of any peg through the articular surface of the distal radius.  Once all distal pegs were placed, the attention was turned back to placement of bicortical shaft screws. 2 additional screws were  placed in the plate, for a total of 3 bicortical shaft screws. All were 6mm in length.  The wound was then copiously irrigated. Final FluoroScan imaging of the construct were taken. The fracture was in anatomic position and the hardware was  well-positioned. The wound again was copiously irrigated.   Given the swelling and tightness within the carpal tunnel, the decision was made to release the carpal tunnel.  An incision following the palmar crease was made. This was an extension of the incision for the distal radius fixation.  This was made in line with the web space between the middle and ring fingers and the distal extent of the incision was where it intersected Kaplan's cardinal line. Bleeding vessels were cauterized with a bipolar.  The subcutaneous tissue was carefully dissected out with a Metzenbaum scissor and pickup until the palmar fascia was encountered. The distal extent of the transverse carpal ligament was then identified. A Freer elevator was placed under the transverse carpal ligament running distally to proximally. A micro-Beaver blade was then used to incise the transverse carpal ligament taking care to avoid injury to any neurovascular structures. The carpal tunnel was found to be extremely constricted. There was significant compression on the median nerve. The transverse carpal ligament was completely released. The nerve was visualized in its entirety and the carpal tunnel. The wound was copiously irrigated.   The subcutaneous tissue was closed with 3.0 vicryl in the forearm.   The skin overlying the palm and forearm was then approximated with 4-0 nylon. Steristrips and Xeroform were placed over the incision. A dry sterile dressing was applied along with an AP fiberglass splint. Patient was overwrapped with an Ace wrap. The tourniquet was deflated at 78 minutes.  A sling was placed on the right upper extremity. The patient was extubated and brought to the PACU in stable condition. I was scrubbed and present the entire case and all sharp and instrument counts were correct at the conclusion the case.  I spoke with the patient's son post-op to let him know that surgery was performed without complication and his mother was stable in  the recovery room.    Timoteo Gaul, MD

## 2017-06-06 NOTE — Anesthesia Preprocedure Evaluation (Signed)
Anesthesia Evaluation  Patient identified by MRN, date of birth, ID band Patient awake    Reviewed: Allergy & Precautions, H&P , NPO status , reviewed documented beta blocker date and time   Airway Mallampati: III  TM Distance: >3 FB     Dental  (+) Upper Dentures, Partial Lower   Pulmonary sleep apnea and Continuous Positive Airway Pressure Ventilation , former smoker,    Pulmonary exam normal        Cardiovascular hypertension, Normal cardiovascular exam     Neuro/Psych    GI/Hepatic GERD  Controlled,  Endo/Other    Renal/GU      Musculoskeletal   Abdominal   Peds  Hematology negative hematology ROS (+)   Anesthesia Other Findings   Reproductive/Obstetrics                             Anesthesia Physical Anesthesia Plan  ASA: II  Anesthesia Plan: General LMA   Post-op Pain Management:    Induction:   PONV Risk Score and Plan: 3 and 4 or greater and Ondansetron, Midazolam and Dexamethasone  Airway Management Planned:   Additional Equipment:   Intra-op Plan:   Post-operative Plan:   Informed Consent: I have reviewed the patients History and Physical, chart, labs and discussed the procedure including the risks, benefits and alternatives for the proposed anesthesia with the patient or authorized representative who has indicated his/her understanding and acceptance.   Dental Advisory Given  Plan Discussed with: CRNA  Anesthesia Plan Comments:         Anesthesia Quick Evaluation

## 2017-06-06 NOTE — Anesthesia Procedure Notes (Signed)
Procedure Name: LMA Insertion Date/Time: 06/06/2017 11:45 AM Performed by: Nile Riggs, CRNA Pre-anesthesia Checklist: Patient identified, Emergency Drugs available, Suction available, Patient being monitored and Timeout performed Patient Re-evaluated:Patient Re-evaluated prior to induction Oxygen Delivery Method: Circle system utilized Preoxygenation: Pre-oxygenation with 100% oxygen Induction Type: IV induction Ventilation: Mask ventilation without difficulty LMA: LMA inserted LMA Size: 3.5 Number of attempts: 1 Placement Confirmation: positive ETCO2,  CO2 detector and breath sounds checked- equal and bilateral Tube secured with: Tape Dental Injury: Teeth and Oropharynx as per pre-operative assessment

## 2017-06-06 NOTE — Progress Notes (Signed)
Subjective:  POST-OP CHECK:  S/p ORIF of right distal radius, open carpal tunnel release and I&D or wrist wound.  Patient reports right wrist pain as moderate.  Son is at the bedside.  No acute issues post-op.  Objective:   VITALS:   Vitals:   06/06/17 1422 06/06/17 1437 06/06/17 1500 06/06/17 1551  BP: 123/64 110/61 117/63 (!) 110/57  Pulse: 69 78 72   Resp: 11 17 16 16   Temp:  98.6 F (37 C) 98.3 F (36.8 C) 98.7 F (37.1 C)  TempSrc:   Oral Oral  SpO2: 93% 94% 92% 93%  Weight:      Height:        PHYSICAL EXAM: Right upper extremity:  Bandage C/D/I.  Fingers well perfused.   +intact sensation to light touch although the patient notes numbness in her finger tips.   LABS  Results for orders placed or performed during the hospital encounter of 06/05/17 (from the past 24 hour(s))  Surgical pcr screen     Status: Abnormal   Collection Time: 06/05/17 10:38 PM  Result Value Ref Range   MRSA, PCR NEGATIVE NEGATIVE   Staphylococcus aureus POSITIVE (A) NEGATIVE  Basic metabolic panel     Status: Abnormal   Collection Time: 06/06/17  5:38 AM  Result Value Ref Range   Sodium 137 135 - 145 mmol/L   Potassium 4.6 3.5 - 5.1 mmol/L   Chloride 108 101 - 111 mmol/L   CO2 22 22 - 32 mmol/L   Glucose, Bld 143 (H) 65 - 99 mg/dL   BUN 13 6 - 20 mg/dL   Creatinine, Ser 1.04 (H) 0.44 - 1.00 mg/dL   Calcium 8.5 (L) 8.9 - 10.3 mg/dL   GFR calc non Af Amer 54 (L) >60 mL/min   GFR calc Af Amer >60 >60 mL/min   Anion gap 7 5 - 15  CBC     Status: Abnormal   Collection Time: 06/06/17  6:55 AM  Result Value Ref Range   WBC 11.4 (H) 3.6 - 11.0 K/uL   RBC 4.36 3.80 - 5.20 MIL/uL   Hemoglobin 12.4 12.0 - 16.0 g/dL   HCT 38.1 35.0 - 47.0 %   MCV 87.2 80.0 - 100.0 fL   MCH 28.5 26.0 - 34.0 pg   MCHC 32.7 32.0 - 36.0 g/dL   RDW 13.2 11.5 - 14.5 %   Platelets 195 150 - 440 K/uL    Dg Chest 1 View  Result Date: 06/06/2017 CLINICAL DATA:  Acute hypoxia. EXAM: CHEST 1 VIEW COMPARISON:  No  comparison film available for review at this time. FINDINGS: 0841 hours. Asymmetric elevation right hemidiaphragm. The lungs are clear without focal pneumonia, edema, pneumothorax or pleural effusion. Subsegmental atelectasis noted right lung base. Cardiopericardial silhouette is at upper limits of normal for size. The visualized bony structures of the thorax are intact. IMPRESSION: Subsegmental atelectasis at the right lung base. Otherwise no acute cardiopulmonary findings. Electronically Signed   By: Misty Stanley M.D.   On: 06/06/2017 10:48   Dg Wrist Complete Right  Result Date: 06/06/2017 CLINICAL DATA:  Postop EXAM: RIGHT WRIST - COMPLETE 3+ VIEW COMPARISON:  CT 06/05/2016, radiograph 06/05/2017 FINDINGS: Casting material obscures bone detail. Displaced ulnar styloid process fracture. Interval surgical plate and multiple screw fixation of comminuted intra-articular distal radius fracture with restoration of alignment. 5 mm bone fragment along the volar surface of the distal radius. Soft tissue gas and swelling is present. IMPRESSION: 1. Interval surgical plate and multiple  screw fixation of comminuted intra-articular distal radius fracture with restoration of alignment. Residual 5 mm bone fragment along the volar surface of the distal radius 2. Gas in the soft tissues consistent with recent operative status 3. Displaced ulnar styloid process fracture Electronically Signed   By: Donavan Foil M.D.   On: 06/06/2017 15:36   Dg Wrist Complete Right  Result Date: 06/05/2017 CLINICAL DATA:  Wrist pain after fall EXAM: RIGHT WRIST - COMPLETE 3+ VIEW COMPARISON:  None. FINDINGS: Acute displaced ulnar styloid process fracture. Acute, comminuted intra-articular fracture of the distal radius with impaction. One bone with of volar displacement of the distal radius fracture fragment, wrist and hand with respect to the distal shaft of the radius and ulna. About 1.5 cm of overriding of the fracture fragments. Soft  tissue swelling is present IMPRESSION: 1. Comminuted, acute and displaced intra-articular distal radius fracture. Distal radius fracture fragment, wrist and hand are positioned volar to the distal shaft of the radius and ulna and there is overriding of the carpal bones and shafts of the distal radius and ulna 2. Acute displaced ulnar styloid process fracture. Electronically Signed   By: Donavan Foil M.D.   On: 06/05/2017 14:16   Ct Wrist Right Wo Contrast  Result Date: 06/05/2017 CLINICAL DATA:  Right wrist pain and right knee pain after fall the patient was at rehab for her dog when she tripped over her dog and fell forward landing on her right arm and knee. She did not lose consciousness or hit her head. She did immediately become anxious, diaphoretic, and felt poorly due to significant pain in her right wrist. EXAM: CT OF THE RIGHT WRIST WITHOUT CONTRAST TECHNIQUE: Multidetector CT imaging of the right wrist was performed according to the standard protocol. Multiplanar CT image reconstructions were also generated. COMPARISON:  None. FINDINGS: Bones/Joint/Cartilage Severely comminuted fracture of the distal radial metaphysis and epiphysis involving the articular surface. 9 mm volar displacement of distal radial fracture fragment. Fracture of the base of the ulnar styloid process with 5 mm of displacement. Small locule of air adjacent to the fracture which may be secondary to an open fracture versus instrumentation. No other acute fracture or dislocation. 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 Mild osteoarthritis of the second CMC joint. Ligaments Ligaments are suboptimally evaluated by CT. Muscles and Tendons Muscles are normal. Flexor and extensor compartment tendons are intact. Soft tissue Hemorrhagic contusion along the dorsal radial aspect of the wrist. No soft tissue mass. IMPRESSION: 1. Severely comminuted fracture of the distal radial metaphysis and epiphysis involving the articular surface. 9  mm volar displacement of distal radial fracture fragment. 2. Fracture of the base of the ulnar styloid process with 5 mm of displacement. Small locule of air adjacent to the fracture which may be secondary to an open fracture versus instrumentation. Electronically Signed   By: Kathreen Devoid   On: 06/05/2017 15:08   Dg Knee Complete 4 Views Right  Result Date: 06/05/2017 CLINICAL DATA:  Fall with knee pain EXAM: RIGHT KNEE - COMPLETE 4+ VIEW COMPARISON:  MRI 11/03/2016 FINDINGS: Status post medial right hemiarthroplasty with intact hardware and normal alignment. No acute displaced fracture is seen. Mild patellofemoral degenerative changes. No large knee effusion. IMPRESSION: Status post medial hemiarthroplasty of the right knee. No acute osseous abnormality. Electronically Signed   By: Donavan Foil M.D.   On: 06/05/2017 14:19    Assessment/Plan: Day of Surgery   Active Problems:   Distal radius fracture, right  Patient  is doing well post-op.  I gave her copies of her xrays from the PACU.  I have reviewed these films and they demonstrate near anatomic reduction of the fracture.   Hardware is well positioned.  Continue IV antibiotics x 24 hours.  PT evaluation in the AM.   Likely discharge tomorrow.   Keep right arm elevated on pillows and apply ice to wrist.  Use sling on right arm when out of bed.      [name] , MD 06/06/2017, 5:47 PM

## 2017-06-07 ENCOUNTER — Encounter: Payer: Self-pay | Admitting: Orthopedic Surgery

## 2017-06-07 DIAGNOSIS — S52351B Displaced comminuted fracture of shaft of radius, right arm, initial encounter for open fracture type I or II: Secondary | ICD-10-CM | POA: Diagnosis not present

## 2017-06-07 LAB — CBC
HEMATOCRIT: 34.9 % — AB (ref 35.0–47.0)
HEMOGLOBIN: 11.2 g/dL — AB (ref 12.0–16.0)
MCH: 28.2 pg (ref 26.0–34.0)
MCHC: 32.2 g/dL (ref 32.0–36.0)
MCV: 87.7 fL (ref 80.0–100.0)
Platelets: 197 10*3/uL (ref 150–440)
RBC: 3.99 MIL/uL (ref 3.80–5.20)
RDW: 13.1 % (ref 11.5–14.5)
WBC: 12.8 10*3/uL — AB (ref 3.6–11.0)

## 2017-06-07 LAB — BASIC METABOLIC PANEL
ANION GAP: 8 (ref 5–15)
BUN: 11 mg/dL (ref 6–20)
CHLORIDE: 108 mmol/L (ref 101–111)
CO2: 23 mmol/L (ref 22–32)
Calcium: 8.4 mg/dL — ABNORMAL LOW (ref 8.9–10.3)
Creatinine, Ser: 0.95 mg/dL (ref 0.44–1.00)
GFR calc non Af Amer: >60 mL/min (ref >60)
Glucose, Bld: 148 mg/dL — ABNORMAL HIGH (ref 65–99)
POTASSIUM: 3.8 mmol/L (ref 3.5–5.1)
SODIUM: 139 mmol/L (ref 135–145)

## 2017-06-07 MED ORDER — IBUPROFEN 800 MG PO TABS: 800.0000 mg | ORAL_TABLET | Freq: Three times a day (TID) | ORAL | 0 refills | Status: DC | PRN

## 2017-06-07 MED ORDER — AMOXICILLIN-POT CLAVULANATE 875-125 MG PO TABS: 1.0000 | ORAL_TABLET | Freq: Two times a day (BID) | ORAL | 0 refills | Status: AC

## 2017-06-07 MED ORDER — ONDANSETRON HCL 4 MG PO TABS: 4.0000 mg | ORAL_TABLET | Freq: Four times a day (QID) | ORAL | 1 refills | Status: DC | PRN

## 2017-06-07 NOTE — Evaluation (Signed)
Physical Therapy Evaluation Patient Details Name: Kelsey Mendoza MRN: [id number] DOB: 1951/03/24 Today's Date: 06/07/2017   History of Present Illness  Pt is a 67 year old female with history of hypertension and hyperlipidemias/pmechanical fall resulting in a comminuted fracture of the distal radial metaphysis and epiphysis involving the articular surface. Pt is now s/p R distal radius ORIF.  Assessment also includes:  acute hypoxic resp failure, OSA, essential HTN, HLD, and hypokalemia.     Clinical Impression  Pt Ind with all functional mobility and gait including amb >300' without AD with excellent cadence and stability and up and down 5 stairs with one rail with very good speed and control.  Pt's baseline SpO2 on room air was 97% with HR 84 bpm.  After amb SpO2 91% with HR 102 bpm with SpO2 returning to 97% and HR to 86 bpm after sitting approximately one minute, MD aware.  Pt will have intermittent help at home and feels comfortable with level of assistance available.  Pt does not require further skilled PT services at this time although she will likely benefit from skilled therapy in the future for R wrist ROM and strengthening once pt has progressed along medically to where that would be appropriate.  Will complete PT orders at this time and will reassess pt pending a change in status upon receipt of new PT orders.    Follow Up Recommendations No PT follow up    Equipment Recommendations  None recommended by PT    Recommendations for Other Services       Precautions / Restrictions Precautions Precautions: Fall Required Braces or Orthoses: Sling(Sling to RUE when OOB) Restrictions Weight Bearing Restrictions: Yes RUE Weight Bearing: Non weight bearing Other Position/Activity Restrictions: NWB through the R wrist but may use a platform walker per Dr. Mack Guise; elevate RUE on 2-3 pillows      Mobility  Bed Mobility Overal bed mobility: Independent             General  bed mobility comments: Good speed and effort with all bed mobility tasks  Transfers   Equipment used: None             General transfer comment: Excellent eccentric and concentric control and stability  Ambulation/Gait Ambulation/Gait assistance: Independent Ambulation Distance (Feet): 300 Feet Assistive device: None Gait Pattern/deviations: WFL(Within Functional Limits)   Gait velocity interpretation: at or above normal speed for age/gender General Gait Details: Above average cadence with amb without AD with very good stability  Stairs Stairs: Yes Stairs assistance: Independent Stair Management: One rail Left Number of Stairs: 5 General stair comments: No instability while ascending or descending stairs  Wheelchair Mobility    Modified Rankin (Stroke Patients Only)       Balance Overall balance assessment: Independent                                           Pertinent Vitals/Pain Pain Assessment: 0-10 Pain Score: 5  Pain Location: R wrist Pain Descriptors / Indicators: Aching;Sore Pain Intervention(s): Premedicated before session;Monitored during session    [place] expects to be discharged to:: Private residence Living Arrangements: Children Available Help at Discharge: Family;Available PRN/intermittently;Friend(s) Type of Home: House Home Access: Level entry     Home Layout: One level Home Equipment: Bedside commode;Walker - 2 wheels;Cane - single point      Prior Function Level of Independence: Independent  Comments: Ind amb without AD community distances with no other fall history, Ind with ADLs and IADLs; current fall resulted while walking two dogs secondary to both dogs pulling on their leashes and causing the fall     Hand Dominance        Extremity/Trunk Assessment   Upper Extremity Assessment Upper Extremity Assessment: RUE deficits/detail RUE: Unable to fully assess due to  immobilization    Lower Extremity Assessment Lower Extremity Assessment: Overall WFL for tasks assessed       Communication   Communication: No difficulties  Cognition Arousal/Alertness: Awake/alert Behavior During Therapy: WFL for tasks assessed/performed Overall Cognitive Status: Within Functional Limits for tasks assessed                                        General Comments General comments (skin integrity, edema, etc.): Very good stability with feet apart and together with eyes closed and head turns    Exercises     Assessment/Plan    PT Assessment Patent does not need any further PT services  PT Problem List         PT Treatment Interventions      PT Goals (Current goals can be found in the Care Plan section)  Acute Rehab PT Goals PT Goal Formulation: All assessment and education complete, DC therapy    Frequency     Barriers to discharge        Co-evaluation               AM-PAC PT "6 Clicks" Daily Activity  Outcome Measure Difficulty turning over in bed (including adjusting bedclothes, sheets and blankets)?: None Difficulty moving from lying on back to sitting on the side of the bed? : None Difficulty sitting down on and standing up from a chair with arms (e.g., wheelchair, bedside commode, etc,.)?: None Help needed moving to and from a bed to chair (including a wheelchair)?: None Help needed walking in hospital room?: None Help needed climbing 3-5 steps with a railing? : None 6 Click Score: 24    End of Session Equipment Utilized During Treatment: Gait belt;Other (comment)(RUE sling) Activity Tolerance: Patient tolerated treatment well Patient left: in chair;with chair alarm set;with call bell/phone within reach;with SCD's reapplied Nurse Communication: Mobility status PT Visit Diagnosis: Muscle weakness (generalized) (M62.81)    Time: 0865-7846 PT Time Calculation (min) (ACUTE ONLY): 32 min   Charges:   PT  Evaluation $PT Eval Low Complexity: 1 Low     PT G Codes:        DRoyetta Asal PT, DPT 06/07/17, 11:14 AM

## 2017-06-07 NOTE — Progress Notes (Signed)
[facility] NAME: Kelsey Mendoza    MR#:  [id number]  DATE OF BIRTH:  1951/03/22  SUBJECTIVE:   Patient doing well this morning. Working with physical therapy.  REVIEW OF SYSTEMS:    Review of Systems  Constitutional: Negative for fever, chills weight loss HENT: Negative for ear pain, nosebleeds, congestion, facial swelling, rhinorrhea, neck pain, neck stiffness and ear discharge.   Respiratory: Negative for cough, shortness of breath, wheezing  Cardiovascular: Negative for chest pain, palpitations and leg swelling.  Gastrointestinal: Negative for heartburn, abdominal pain, vomiting, diarrhea or consitpation Genitourinary: Negative for dysuria, urgency, frequency, hematuria Musculoskeletal: Negative for back pain or joint pain Neurological: Negative for dizziness, seizures, syncope, focal weakness,  numbness and headaches.  Hematological: Does not bruise/bleed easily.  Psychiatric/Behavioral: Negative for hallucinations, confusion, dysphoric mood    Tolerating Diet: yes      DRUG ALLERGIES:  No Known Allergies  VITALS:  Blood pressure 133/71, pulse (!) 55, temperature 97.9 F (36.6 C), temperature source Oral, resp. rate 18, height 5\' 5"  (1.651 m), weight 100.7 kg (222 lb), SpO2 97 %.  PHYSICAL EXAMINATION:  Constitutional: Appears well-developed and well-nourished. No distress. HENT: Normocephalic. Marland Kitchen Oropharynx is clear and moist.  Eyes: Conjunctivae and EOM are normal. PERRLA, no scleral icterus.  Neck: Normal ROM. Neck supple. No JVD. No tracheal deviation. CVS: RRR, S1/S2 +, no murmurs, no gallops, no carotid bruit.  Pulmonary: Effort and breath sounds normal, no stridor, rhonchi, wheezes, rales.  Abdominal: Soft. BS +,  no distension, tenderness, rebound or guarding.  Musculoskeletal: Normal range of motion. No edema and no tenderness.  Right wrist is wrapped  Neuro: Alert. CN 2-12 grossly intact. No focal  deficits. Skin: Skin is warm and dry. No rash noted. Psychiatric: Normal mood and affect.      LABORATORY PANEL:   CBC Recent Labs  Lab 06/07/17 0300  WBC 12.8*  HGB 11.2*  HCT 34.9*  PLT 197   ------------------------------------------------------------------------------------------------------------------  Chemistries  Recent Labs  Lab 06/07/17 0300  NA 139  K 3.8  CL 108  CO2 23  GLUCOSE 148*  BUN 11  CREATININE 0.95  CALCIUM 8.4*   ------------------------------------------------------------------------------------------------------------------  Cardiac Enzymes No results for input(s): TROPONINI in the last 168 hours. ------------------------------------------------------------------------------------------------------------------  RADIOLOGY:  Dg Chest 1 View  Result Date: 06/06/2017 CLINICAL DATA:  Acute hypoxia. EXAM: CHEST 1 VIEW COMPARISON:  No comparison film available for review at this time. FINDINGS: 0841 hours. Asymmetric elevation right hemidiaphragm. The lungs are clear without focal pneumonia, edema, pneumothorax or pleural effusion. Subsegmental atelectasis noted right lung base. Cardiopericardial silhouette is at upper limits of normal for size. The visualized bony structures of the thorax are intact. IMPRESSION: Subsegmental atelectasis at the right lung base. Otherwise no acute cardiopulmonary findings. Electronically Signed   By: Misty Stanley M.D.   On: 06/06/2017 10:48   Dg Wrist Complete Right  Result Date: 06/06/2017 CLINICAL DATA:  Postop EXAM: RIGHT WRIST - COMPLETE 3+ VIEW COMPARISON:  CT 06/05/2016, radiograph 06/05/2017 FINDINGS: Casting material obscures bone detail. Displaced ulnar styloid process fracture. Interval surgical plate and multiple screw fixation of comminuted intra-articular distal radius fracture with restoration of alignment. 5 mm bone fragment along the volar surface of the distal radius. Soft tissue gas and swelling is  present. IMPRESSION: 1. Interval surgical plate and multiple screw fixation of comminuted intra-articular distal radius fracture with restoration of alignment. Residual 5 mm bone fragment along the volar surface of  the distal radius 2. Gas in the soft tissues consistent with recent operative status 3. Displaced ulnar styloid process fracture Electronically Signed   By: Donavan Foil M.D.   On: 06/06/2017 15:36   Dg Wrist Complete Right  Result Date: 06/05/2017 CLINICAL DATA:  Wrist pain after fall EXAM: RIGHT WRIST - COMPLETE 3+ VIEW COMPARISON:  None. FINDINGS: Acute displaced ulnar styloid process fracture. Acute, comminuted intra-articular fracture of the distal radius with impaction. One bone with of volar displacement of the distal radius fracture fragment, wrist and hand with respect to the distal shaft of the radius and ulna. About 1.5 cm of overriding of the fracture fragments. Soft tissue swelling is present IMPRESSION: 1. Comminuted, acute and displaced intra-articular distal radius fracture. Distal radius fracture fragment, wrist and hand are positioned volar to the distal shaft of the radius and ulna and there is overriding of the carpal bones and shafts of the distal radius and ulna 2. Acute displaced ulnar styloid process fracture. Electronically Signed   By: Donavan Foil M.D.   On: 06/05/2017 14:16   Ct Wrist Right Wo Contrast  Result Date: 06/05/2017 CLINICAL DATA:  Right wrist pain and right knee pain after fall the patient was at rehab for her dog when she tripped over her dog and fell forward landing on her right arm and knee. She did not lose consciousness or hit her head. She did immediately become anxious, diaphoretic, and felt poorly due to significant pain in her right wrist. EXAM: CT OF THE RIGHT WRIST WITHOUT CONTRAST TECHNIQUE: Multidetector CT imaging of the right wrist was performed according to the standard protocol. Multiplanar CT image reconstructions were also generated.  COMPARISON:  None. FINDINGS: Bones/Joint/Cartilage Severely comminuted fracture of the distal radial metaphysis and epiphysis involving the articular surface. 9 mm volar displacement of distal radial fracture fragment. Fracture of the base of the ulnar styloid process with 5 mm of displacement. Small locule of air adjacent to the fracture which may be secondary to an open fracture versus instrumentation. No other acute fracture or dislocation. 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 Mild osteoarthritis of the second CMC joint. Ligaments Ligaments are suboptimally evaluated by CT. Muscles and Tendons Muscles are normal. Flexor and extensor compartment tendons are intact. Soft tissue Hemorrhagic contusion along the dorsal radial aspect of the wrist. No soft tissue mass. IMPRESSION: 1. Severely comminuted fracture of the distal radial metaphysis and epiphysis involving the articular surface. 9 mm volar displacement of distal radial fracture fragment. 2. Fracture of the base of the ulnar styloid process with 5 mm of displacement. Small locule of air adjacent to the fracture which may be secondary to an open fracture versus instrumentation. Electronically Signed   By: Kathreen Devoid   On: 06/05/2017 15:08   Dg Knee Complete 4 Views Right  Result Date: 06/05/2017 CLINICAL DATA:  Fall with knee pain EXAM: RIGHT KNEE - COMPLETE 4+ VIEW COMPARISON:  MRI 11/03/2016 FINDINGS: Status post medial right hemiarthroplasty with intact hardware and normal alignment. No acute displaced fracture is seen. Mild patellofemoral degenerative changes. No large knee effusion. IMPRESSION: Status post medial hemiarthroplasty of the right knee. No acute osseous abnormality. Electronically Signed   By: Donavan Foil M.D.   On: 06/05/2017 14:19     ASSESSMENT AND PLAN:   67 year old female with history of hypertension and hyperlipidemia s/p mechanical fall and unfortunately has suffered Severely comminuted fracture of the distal  radial metaphysis and epiphysis involving the articular surface.   1.  distal right fracture, wrist: Management as per orthopedic surgery   2. Acute hypoxic respiratory failure: Patient is now on room air. Chest x-ray is consistent with atelectasis. Continue incentive spirometer.   3. OSA: Continue CPAP at night  4. Essential hypertension: Continue  Norvasc, Lasix, losartan and Maxide 5. Hypokalemia: This has been repleted  6. Hyperlipidemia: Continue Crestor     Patient to be discharged home today. Hospitalist service will sign off. Thank you for consult.    Management plans discussed with the patient and she is in agreement.  CODE STATUS: full  TOTAL TIME TAKING CARE OF THIS PATIENT: 30 minutes.     POSSIBLE D/C today, DEPENDING ON CLINICAL CONDITION.   Jakaiya Netherland M.D on 06/07/2017 at 9:54 AM  Between 7am to 6pm - Pager - [phone] After 6pm go to [email or web address] - password [email or web address] [facility]  Office  [phone]  CC: Primary care physician; [name], MD  Note: This dictation was prepared with Dragon dictation along with smaller phrase technology. Any transcriptional errors that result from this process are unintentional.

## 2017-06-07 NOTE — Progress Notes (Signed)
Subjective:  POD # 1 s/p ORIF of distal radius fracture and open carpal tunnel release.  Patient reports right wrist pain as mild to moderate.  Patient feels she is ready to go home. Her son is at the bedside.  Objective:   VITALS:   Vitals:   06/07/17 0038 06/07/17 0437 06/07/17 0748 06/07/17 1100  BP: (!) 120/59 116/62 133/71   Pulse: 68 (!) 56 (!) 55 84  Resp:  17 18   Temp: 97.8 F (36.6 C) 98.1 F (36.7 C) 97.9 F (36.6 C)   TempSrc: Oral Oral Oral   SpO2: 98% 100% 97% 97%  Weight:      Height:        PHYSICAL EXAM: Right upper extremity:  Patient's splint and dressing remained clean dry and intact. She is elevating her right upper extremity in bed. Her fingers are well-perfused. She has intact sensation to light touch in all 5 digits of the right hand. She can flex and extend her fingers actively but aren't motion is limited secondary to the dressings.   LABS  Results for orders placed or performed during the hospital encounter of 06/05/17 (from the past 24 hour(s))  CBC     Status: Abnormal   Collection Time: 06/07/17  3:00 AM  Result Value Ref Range   WBC 12.8 (H) 3.6 - 11.0 K/uL   RBC 3.99 3.80 - 5.20 MIL/uL   Hemoglobin 11.2 (L) 12.0 - 16.0 g/dL   HCT 34.9 (L) 35.0 - 47.0 %   MCV 87.7 80.0 - 100.0 fL   MCH 28.2 26.0 - 34.0 pg   MCHC 32.2 32.0 - 36.0 g/dL   RDW 13.1 11.5 - 14.5 %   Platelets 197 150 - 440 K/uL  Basic metabolic panel     Status: Abnormal   Collection Time: 06/07/17  3:00 AM  Result Value Ref Range   Sodium 139 135 - 145 mmol/L   Potassium 3.8 3.5 - 5.1 mmol/L   Chloride 108 101 - 111 mmol/L   CO2 23 22 - 32 mmol/L   Glucose, Bld 148 (H) 65 - 99 mg/dL   BUN 11 6 - 20 mg/dL   Creatinine, Ser 0.95 0.44 - 1.00 mg/dL   Calcium 8.4 (L) 8.9 - 10.3 mg/dL   GFR calc non Af Amer >60 >60 mL/min   GFR calc Af Amer >60 >60 mL/min   Anion gap 8 5 - 15    Dg Chest 1 View  Result Date: 06/06/2017 CLINICAL DATA:  Acute hypoxia. EXAM: CHEST 1 VIEW  COMPARISON:  No comparison film available for review at this time. FINDINGS: 0841 hours. Asymmetric elevation right hemidiaphragm. The lungs are clear without focal pneumonia, edema, pneumothorax or pleural effusion. Subsegmental atelectasis noted right lung base. Cardiopericardial silhouette is at upper limits of normal for size. The visualized bony structures of the thorax are intact. IMPRESSION: Subsegmental atelectasis at the right lung base. Otherwise no acute cardiopulmonary findings. Electronically Signed   By: Misty Stanley M.D.   On: 06/06/2017 10:48   Dg Wrist Complete Right  Result Date: 06/06/2017 CLINICAL DATA:  Postop EXAM: RIGHT WRIST - COMPLETE 3+ VIEW COMPARISON:  CT 06/05/2016, radiograph 06/05/2017 FINDINGS: Casting material obscures bone detail. Displaced ulnar styloid process fracture. Interval surgical plate and multiple screw fixation of comminuted intra-articular distal radius fracture with restoration of alignment. 5 mm bone fragment along the volar surface of the distal radius. Soft tissue gas and swelling is present. IMPRESSION: 1. Interval surgical plate and  multiple screw fixation of comminuted intra-articular distal radius fracture with restoration of alignment. Residual 5 mm bone fragment along the volar surface of the distal radius 2. Gas in the soft tissues consistent with recent operative status 3. Displaced ulnar styloid process fracture Electronically Signed   By: Donavan Foil M.D.   On: 06/06/2017 15:36   Dg Wrist Complete Right  Result Date: 06/05/2017 CLINICAL DATA:  Wrist pain after fall EXAM: RIGHT WRIST - COMPLETE 3+ VIEW COMPARISON:  None. FINDINGS: Acute displaced ulnar styloid process fracture. Acute, comminuted intra-articular fracture of the distal radius with impaction. One bone with of volar displacement of the distal radius fracture fragment, wrist and hand with respect to the distal shaft of the radius and ulna. About 1.5 cm of overriding of the fracture  fragments. Soft tissue swelling is present IMPRESSION: 1. Comminuted, acute and displaced intra-articular distal radius fracture. Distal radius fracture fragment, wrist and hand are positioned volar to the distal shaft of the radius and ulna and there is overriding of the carpal bones and shafts of the distal radius and ulna 2. Acute displaced ulnar styloid process fracture. Electronically Signed   By: Donavan Foil M.D.   On: 06/05/2017 14:16   Ct Wrist Right Wo Contrast  Result Date: 06/05/2017 CLINICAL DATA:  Right wrist pain and right knee pain after fall the patient was at rehab for her dog when she tripped over her dog and fell forward landing on her right arm and knee. She did not lose consciousness or hit her head. She did immediately become anxious, diaphoretic, and felt poorly due to significant pain in her right wrist. EXAM: CT OF THE RIGHT WRIST WITHOUT CONTRAST TECHNIQUE: Multidetector CT imaging of the right wrist was performed according to the standard protocol. Multiplanar CT image reconstructions were also generated. COMPARISON:  None. FINDINGS: Bones/Joint/Cartilage Severely comminuted fracture of the distal radial metaphysis and epiphysis involving the articular surface. 9 mm volar displacement of distal radial fracture fragment. Fracture of the base of the ulnar styloid process with 5 mm of displacement. Small locule of air adjacent to the fracture which may be secondary to an open fracture versus instrumentation. No other acute fracture or dislocation. 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 Mild osteoarthritis of the second CMC joint. Ligaments Ligaments are suboptimally evaluated by CT. Muscles and Tendons Muscles are normal. Flexor and extensor compartment tendons are intact. Soft tissue Hemorrhagic contusion along the dorsal radial aspect of the wrist. No soft tissue mass. IMPRESSION: 1. Severely comminuted fracture of the distal radial metaphysis and epiphysis involving the  articular surface. 9 mm volar displacement of distal radial fracture fragment. 2. Fracture of the base of the ulnar styloid process with 5 mm of displacement. Small locule of air adjacent to the fracture which may be secondary to an open fracture versus instrumentation. Electronically Signed   By: Kathreen Devoid   On: 06/05/2017 15:08   Dg Knee Complete 4 Views Right  Result Date: 06/05/2017 CLINICAL DATA:  Fall with knee pain EXAM: RIGHT KNEE - COMPLETE 4+ VIEW COMPARISON:  MRI 11/03/2016 FINDINGS: Status post medial right hemiarthroplasty with intact hardware and normal alignment. No acute displaced fracture is seen. Mild patellofemoral degenerative changes. No large knee effusion. IMPRESSION: Status post medial hemiarthroplasty of the right knee. No acute osseous abnormality. Electronically Signed   By: Donavan Foil M.D.   On: 06/05/2017 14:19    Assessment/Plan: 1 Day Post-Op   Active Problems:   Distal radius fracture, right  Patient is doing well postop. Her pain is controlled. She is ready for discharge home. She requested I discharge her on ibuprofen and Zofran. She has some oxycodone already at home from her previous knee surgery and will take that if she needs it. Patient was instructed to keep strict elevation to the right upper extremity. She will use a sling when she was out of bed to help elevate the right upper extremity. She is to avoid any weightbearing or lifting with the right upper extremity. She'll follow up with me in clinic in 1 week.      [name] , MD 06/07/2017, 12:23 PM

## 2017-06-07 NOTE — Progress Notes (Signed)
Discharge summary reviewed with verbal understanding. Education given to sling to be in place while out of bed. PNA and Flu administered at discharge. Escorted to personal vehicle.

## 2017-06-07 NOTE — Discharge Summary (Signed)
Physician Discharge Summary  Patient ID: Kelsey Mendoza MRN: [id number] DOB/AGE: 1950-08-23 67 y.o.  Admit date: 06/05/2017 Discharge date: 06/07/2017  Admission Diagnoses:  distal radial fracture <principal problem not specified>  Discharge Diagnoses:  distal radial fracture Active Problems:   Distal radius fracture, right   Past Medical History:  Diagnosis Date  . Cancer ([place])   . CPAP (continuous positive airway pressure) dependence   . Hypertension   . Knee pain     Surgeries: Procedure(s): OPEN REDUCTION INTERNAL FIXATION (ORIF) DISTAL RADIAL FRACTURE CARPAL TUNNEL RELEASE IRRIGATION AND DEBRIDEMENT EXTREMITY--RIGHT ARM on 06/06/2017   Consultants (if any):   Discharged Condition: Improved  Hospital Course: [name] LEVELS is an 67 y.o. female who was admitted 06/05/2017 with a diagnosis of  distal radial fracture <principal problem not specified> and went to the operating room on 06/06/2017 and underwent an uncomplicated ORIF of the right distal radius, open carpal tunnel release and irrigation debridement of wrist wound.    She was given perioperative antibiotics:  Anti-infectives (From admission, onward)   Start     Dose/Rate Route Frequency Ordered Stop   06/07/17 0000  amoxicillin-clavulanate (AUGMENTIN) 875-125 MG tablet     1 tablet Oral 2 times daily 06/07/17 1232 06/17/17 2359   06/06/17 1515  ceFAZolin (ANCEF) IVPB 1 g/50 mL premix     1 g 100 mL/hr over 30 Minutes Intravenous Every 6 hours 06/06/17 1504 06/07/17 0258   06/05/17 2245  ceFAZolin (ANCEF) IVPB 1 g/50 mL premix  Status:  Discontinued     1 g 100 mL/hr over 30 Minutes Intravenous Every 6 hours 06/05/17 2241 06/06/17 1504   06/05/17 1415  ceFAZolin (ANCEF) IVPB 1 g/50 mL premix     1 g 100 mL/hr over 30 Minutes Intravenous  Once 06/05/17 1409 06/05/17 1543    .  She was given sequential compression devices, early ambulation for DVT prophylaxis.  She benefited maximally from the hospital  stay and there were no complications.    Recent vital signs:  Vitals:   06/07/17 0748 06/07/17 1100  BP: 133/71   Pulse: (!) 55 84  Resp: 18   Temp: 97.9 F (36.6 C)   SpO2: 97% 97%    Recent laboratory studies:  Lab Results  Component Value Date   HGB 11.2 (L) 06/07/2017   HGB 12.4 06/06/2017   HGB 13.0 06/05/2017   Lab Results  Component Value Date   WBC 12.8 (H) 06/07/2017   PLT 197 06/07/2017   Lab Results  Component Value Date   INR 0.89 06/05/2017   Lab Results  Component Value Date   NA 139 06/07/2017   K 3.8 06/07/2017   CL 108 06/07/2017   CO2 23 06/07/2017   BUN 11 06/07/2017   CREATININE 0.95 06/07/2017   GLUCOSE 148 (H) 06/07/2017    Discharge Medications:   Allergies as of 06/07/2017   No Known Allergies     Medication List    TAKE these medications   amLODipine 5 MG tablet Commonly known as:  NORVASC Take 1 tablet (5 mg total) by mouth daily.   amoxicillin-clavulanate 875-125 MG tablet Commonly known as:  AUGMENTIN Take 1 tablet by mouth 2 (two) times daily for 10 days.   cetirizine 10 MG tablet Commonly known as:  ZYRTEC Take 5 mg by mouth daily.   furosemide 40 MG tablet Commonly known as:  LASIX Take 40 mg by mouth daily as needed for fluid.   gabapentin 100 MG capsule  Commonly known as:  NEURONTIN Take 100 mg by mouth at bedtime.   hydrocortisone 2.5 % cream Apply topically 2 (two) times daily.   hydrOXYzine 10 MG tablet Commonly known as:  ATARAX/VISTARIL Take 10 mg by mouth at bedtime.   ibuprofen 800 MG tablet Commonly known as:  ADVIL,MOTRIN Take 1 tablet (800 mg total) by mouth every 8 (eight) hours as needed.   ketoconazole 2 % shampoo Commonly known as:  NIZORAL Apply 1 application topically 2 (two) times a week.   losartan 100 MG tablet Commonly known as:  COZAAR Take 100 mg by mouth daily.   ondansetron 4 MG tablet Commonly known as:  ZOFRAN Take 1 tablet (4 mg total) by mouth every 6 (six) hours as  needed for nausea.   oxyCODONE-acetaminophen 5-325 MG tablet Commonly known as:  PERCOCET Take 1 tablet by mouth every 6 (six) hours as needed for severe pain.   rosuvastatin 5 MG tablet Commonly known as:  CRESTOR Take 5 mg by mouth at bedtime.   triamterene-hydrochlorothiazide 37.5-25 MG tablet Commonly known as:  MAXZIDE-25 Take 1 tablet by mouth daily.   urea 20 % cream Commonly known as:  CARMOL Apply topically daily as needed. For dry skin on feet   Vitamin D3 10000 units Tabs Take 10,000 Units by mouth daily.       Diagnostic Studies: Dg Chest 1 View  Result Date: 06/06/2017 CLINICAL DATA:  Acute hypoxia. EXAM: CHEST 1 VIEW COMPARISON:  No comparison film available for review at this time. FINDINGS: 0841 hours. Asymmetric elevation right hemidiaphragm. The lungs are clear without focal pneumonia, edema, pneumothorax or pleural effusion. Subsegmental atelectasis noted right lung base. Cardiopericardial silhouette is at upper limits of normal for size. The visualized bony structures of the thorax are intact. IMPRESSION: Subsegmental atelectasis at the right lung base. Otherwise no acute cardiopulmonary findings. Electronically Signed   By: Misty Stanley M.D.   On: 06/06/2017 10:48   Dg Wrist Complete Right  Result Date: 06/06/2017 CLINICAL DATA:  Postop EXAM: RIGHT WRIST - COMPLETE 3+ VIEW COMPARISON:  CT 06/05/2016, radiograph 06/05/2017 FINDINGS: Casting material obscures bone detail. Displaced ulnar styloid process fracture. Interval surgical plate and multiple screw fixation of comminuted intra-articular distal radius fracture with restoration of alignment. 5 mm bone fragment along the volar surface of the distal radius. Soft tissue gas and swelling is present. IMPRESSION: 1. Interval surgical plate and multiple screw fixation of comminuted intra-articular distal radius fracture with restoration of alignment. Residual 5 mm bone fragment along the volar surface of the distal  radius 2. Gas in the soft tissues consistent with recent operative status 3. Displaced ulnar styloid process fracture Electronically Signed   By: Donavan Foil M.D.   On: 06/06/2017 15:36   Dg Wrist Complete Right  Result Date: 06/05/2017 CLINICAL DATA:  Wrist pain after fall EXAM: RIGHT WRIST - COMPLETE 3+ VIEW COMPARISON:  None. FINDINGS: Acute displaced ulnar styloid process fracture. Acute, comminuted intra-articular fracture of the distal radius with impaction. One bone with of volar displacement of the distal radius fracture fragment, wrist and hand with respect to the distal shaft of the radius and ulna. About 1.5 cm of overriding of the fracture fragments. Soft tissue swelling is present IMPRESSION: 1. Comminuted, acute and displaced intra-articular distal radius fracture. Distal radius fracture fragment, wrist and hand are positioned volar to the distal shaft of the radius and ulna and there is overriding of the carpal bones and shafts of the distal radius and ulna  2. Acute displaced ulnar styloid process fracture. Electronically Signed   By: Donavan Foil M.D.   On: 06/05/2017 14:16   Ct Wrist Right Wo Contrast  Result Date: 06/05/2017 CLINICAL DATA:  Right wrist pain and right knee pain after fall the patient was at rehab for her dog when she tripped over her dog and fell forward landing on her right arm and knee. She did not lose consciousness or hit her head. She did immediately become anxious, diaphoretic, and felt poorly due to significant pain in her right wrist. EXAM: CT OF THE RIGHT WRIST WITHOUT CONTRAST TECHNIQUE: Multidetector CT imaging of the right wrist was performed according to the standard protocol. Multiplanar CT image reconstructions were also generated. COMPARISON:  None. FINDINGS: Bones/Joint/Cartilage Severely comminuted fracture of the distal radial metaphysis and epiphysis involving the articular surface. 9 mm volar displacement of distal radial fracture fragment. Fracture  of the base of the ulnar styloid process with 5 mm of displacement. Small locule of air adjacent to the fracture which may be secondary to an open fracture versus instrumentation. No other acute fracture or dislocation. 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 Mild osteoarthritis of the second CMC joint. Ligaments Ligaments are suboptimally evaluated by CT. Muscles and Tendons Muscles are normal. Flexor and extensor compartment tendons are intact. Soft tissue Hemorrhagic contusion along the dorsal radial aspect of the wrist. No soft tissue mass. IMPRESSION: 1. Severely comminuted fracture of the distal radial metaphysis and epiphysis involving the articular surface. 9 mm volar displacement of distal radial fracture fragment. 2. Fracture of the base of the ulnar styloid process with 5 mm of displacement. Small locule of air adjacent to the fracture which may be secondary to an open fracture versus instrumentation. Electronically Signed   By: Kathreen Devoid   On: 06/05/2017 15:08   Dg Knee Complete 4 Views Right  Result Date: 06/05/2017 CLINICAL DATA:  Fall with knee pain EXAM: RIGHT KNEE - COMPLETE 4+ VIEW COMPARISON:  MRI 11/03/2016 FINDINGS: Status post medial right hemiarthroplasty with intact hardware and normal alignment. No acute displaced fracture is seen. Mild patellofemoral degenerative changes. No large knee effusion. IMPRESSION: Status post medial hemiarthroplasty of the right knee. No acute osseous abnormality. Electronically Signed   By: Donavan Foil M.D.   On: 06/05/2017 14:19    Disposition: 01-Home or Self Care  Discharge Instructions    Call MD / Call 911   Complete by:  As directed    If you experience chest pain or shortness of breath, CALL 911 and be transported to the hospital emergency room.  If you develope a fever above 101 F, pus (white drainage) or increased drainage or redness at the wound, or calf pain, call your surgeon's office.   Constipation Prevention   Complete by:   As directed    Drink plenty of fluids.  Prune juice may be helpful.  You may use a stool softener, such as Colace (over the counter) 100 mg twice a day.  Use MiraLax (over the counter) for constipation as needed.   Diet general   Complete by:  As directed    Discharge instructions   Complete by:  As directed    Keep the splint and dressing on the right arm until next follow-up in the orthopedic clinic. Use a sling for the right arm when standing or walking to help with elevation. Continue to elevate the right upper extremity at all times at home.  Do not lift or weight-bear on the  right upper extremity.  Contact Dr. Harden Mo office or go to the emergency room if you have severe pain, lose feeling or motion to her fingers are half poor circulation in near her fingers.   Driving restrictions   Complete by:  As directed    No driving for until follow-up in the office with Dr. Mack Guise.   Increase activity slowly as tolerated   Complete by:  As directed    Lifting restrictions   Complete by:  As directed    No lifting for 6-8 weeks         Signed: [name] ,MD 06/07/2017, 12:32 PM

## 2017-06-08 NOTE — Anesthesia Postprocedure Evaluation (Signed)
Anesthesia Post Note  Patient: Kelsey Mendoza  Procedure(s) Performed: OPEN REDUCTION INTERNAL FIXATION (ORIF) DISTAL RADIAL FRACTURE (N/A ) CARPAL TUNNEL RELEASE (Right ) IRRIGATION AND DEBRIDEMENT EXTREMITY--RIGHT ARM (Right )  Patient location during evaluation: PACU Anesthesia Type: General Level of consciousness: awake and alert Pain management: pain level controlled Vital Signs Assessment: post-procedure vital signs reviewed and stable Respiratory status: spontaneous breathing, nonlabored ventilation, respiratory function stable and patient connected to nasal cannula oxygen Cardiovascular status: blood pressure returned to baseline and stable Postop Assessment: no apparent nausea or vomiting Anesthetic complications: no     Last Vitals:  Vitals:   06/07/17 0748 06/07/17 1100  BP: 133/71   Pulse: (!) 55 84  Resp: 18   Temp: 36.6 C   SpO2: 97% 97%    Last Pain:  Vitals:   06/07/17 1133  TempSrc:   PainSc: 6                  Dyanne Yorks Harvie Heck

## 2017-07-03 DIAGNOSIS — S52539A Colles' fracture of unspecified radius, initial encounter for closed fracture: Secondary | ICD-10-CM | POA: Insufficient documentation

## 2017-10-01 ENCOUNTER — Other Ambulatory Visit: Payer: Self-pay | Admitting: Family Medicine

## 2017-10-01 DIAGNOSIS — Z1231 Encounter for screening mammogram for malignant neoplasm of breast: Secondary | ICD-10-CM

## 2017-10-23 ENCOUNTER — Ambulatory Visit
Admission: RE | Admit: 2017-10-23 | Discharge: 2017-10-23 | Disposition: A | Discharge status: Home / Self Care | Payer: Medicare Other | Source: Ambulatory Visit | Attending: Family Medicine | Admitting: Family Medicine

## 2017-10-23 DIAGNOSIS — Z1231 Encounter for screening mammogram for malignant neoplasm of breast: Secondary | ICD-10-CM | POA: Diagnosis present

## 2018-02-28 ENCOUNTER — Ambulatory Visit: Payer: Medicare Other

## 2018-02-28 ENCOUNTER — Other Ambulatory Visit: Payer: Self-pay

## 2018-02-28 ENCOUNTER — Ambulatory Visit
Admission: EM | Admit: 2018-02-28 | Discharge: 2018-02-28 | Disposition: A | Discharge status: Home / Self Care | Payer: Medicare Other | Attending: Family Medicine | Admitting: Family Medicine

## 2018-02-28 ENCOUNTER — Encounter: Payer: Self-pay | Admitting: Emergency Medicine

## 2018-02-28 DIAGNOSIS — M25562 Pain in left knee: Secondary | ICD-10-CM | POA: Diagnosis present

## 2018-02-28 DIAGNOSIS — M25462 Effusion, left knee: Secondary | ICD-10-CM | POA: Diagnosis not present

## 2018-02-28 HISTORY — DX: Psoriasis, unspecified: L40.9

## 2018-02-28 MED ORDER — MELOXICAM 7.5 MG PO TABS: 7.5000 mg | ORAL_TABLET | Freq: Every day | ORAL | 0 refills | Status: DC

## 2018-02-28 NOTE — ED Triage Notes (Signed)
Patient in today c/o left knee pain x 2 days. Patient states she had been running on Sunday (02/24/18) and her knees and legs were sore and then on Tuesday (02/26/18) she was walking at [employer] and her left knee gave way. Patient denies falling.

## 2018-02-28 NOTE — ED Provider Notes (Signed)
[facility]    CSN: [id number] Arrival date & time: 02/28/18  0818     History   Chief Complaint Chief Complaint  Patient presents with  . Knee Pain    left    HPI Kelsey Mendoza is a 67 y.o. female.   HPI  67 year old female accompanied by her husband presents with left knee pain that started 2 days ago.  She states that on Sunday, 02/24/2018 a  goat had gotten out of the pen and she was running trying to catch it.  Afterward she said that her knees and legs were sore.  Tuesday, 02/26/2018 she was walking at [employer] in the parking lot when she stepped wrong and her left knee gave way.  She did not fall.  Now whenever she ambulates she has pain over the anteriior medial aspect of her knee.  Indicates near the joint line.  Noticed much swelling but has been using ice and is using a walker to assist in her ambulation.  He denies any locking popping or clicking.         Past Medical History:  Diagnosis Date  . 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 CPAP (continuous positive airway pressure) dependence   . Hypertension   . Knee pain   . Psoriasis of scalp     Patient Active Problem List   Diagnosis Date Noted  . Distal radius fracture, right 06/05/2017    Past Surgical History:  Procedure Laterality Date  . ABDOMINAL HYSTERECTOMY    . BACK SURGERY    . BLADDER SURGERY    . CARPAL TUNNEL RELEASE Right 06/06/2017   Procedure: CARPAL TUNNEL RELEASE;  Surgeon: [name], MD;  Location: [facility] ORS;  Service: Orthopedics;  Laterality: Right;  . I&D EXTREMITY Right 06/06/2017   Procedure: IRRIGATION AND DEBRIDEMENT EXTREMITY--RIGHT ARM;  Surgeon: [name], MD;  Location: [facility] ORS;  Service: Orthopedics;  Laterality: Right;  . KNEE SURGERY    . OPEN REDUCTION INTERNAL FIXATION (ORIF) DISTAL RADIAL FRACTURE N/A 06/06/2017   Procedure: OPEN REDUCTION INTERNAL FIXATION (ORIF) DISTAL RADIAL FRACTURE;  Surgeon: [name], MD;  Location: [facility] ORS;   Service: Orthopedics;  Laterality: N/A;    OB History   None      Home Medications    Prior to Admission medications   Medication Sig Start Date End Date Taking? Authorizing Provider  amLODipine (NORVASC) 5 MG tablet Take 1 tablet (5 mg total) by mouth daily. 08/16/16 02/28/18 Yes Merlyn Lot, MD  folic acid (FOLVITE) 1 MG tablet TAKE 2 TABLETS BY MOUTH EVERY DAY YOU DO NOT TAKE METHOTREXATE. 02/11/18  Yes [provider]  furosemide (LASIX) 40 MG tablet Take 40 mg by mouth daily as needed for fluid.    Yes [provider]  gabapentin (NEURONTIN) 100 MG capsule Take 100 mg by mouth at bedtime.   Yes [provider]  hydrocortisone 2.5 % cream Apply topically 2 (two) times daily.   Yes [provider]  hydrOXYzine (ATARAX/VISTARIL) 10 MG tablet Take 10 mg by mouth at bedtime.   Yes [provider]  ketoconazole (NIZORAL) 2 % shampoo Apply 1 application topically 2 (two) times a week.   Yes [provider]  losartan (COZAAR) 100 MG tablet Take 100 mg by mouth daily.   Yes [provider]  methotrexate (RHEUMATREX) 2.5 MG tablet TAKE 4 TABLETS (10MG  TOTAL) BY MOUTH ONCE WEEKLY. 02/12/18  Yes [provider]  rosuvastatin (CRESTOR) 5 MG tablet  Take 5 mg by mouth at bedtime.    Yes [provider]  triamterene-hydrochlorothiazide (MAXZIDE-25) 37.5-25 MG tablet Take 1 tablet by mouth daily.   Yes [provider]  urea (CARMOL) 20 % cream Apply topically daily as needed. For dry skin on feet   Yes [provider]  meloxicam (MOBIC) 7.5 MG tablet Take 1 tablet (7.5 mg total) by mouth daily. 02/28/18   Lorin Picket, PA-C    Family History Family History  Problem Relation Age of Onset  . Hypertension Mother   . Hypertension Father   . CAD Father   . Breast cancer Neg Hx     Social History Social History   Tobacco Use  . Smoking status: Former Smoker    Last attempt to quit:  03/01/1983    Years since quitting: 35.0  . Smokeless tobacco: Never Used  Substance Use Topics  . Alcohol use: No  . Drug use: No     Allergies   Trazodone   Review of Systems Review of Systems  Constitutional: Positive for activity change. Negative for appetite change, chills, fatigue and fever.  Musculoskeletal: Positive for arthralgias and gait problem.  All other systems reviewed and are negative.    Physical Exam Triage Vital Signs ED Triage Vitals  Enc Vitals Group     BP 02/28/18 0832 119/73     Pulse Rate 02/28/18 0832 64     Resp 02/28/18 0832 16     Temp 02/28/18 0832 98.2 F (36.8 C)     Temp Source 02/28/18 0832 Oral     SpO2 02/28/18 0832 96 %     Weight 02/28/18 0832 220 lb (99.8 kg)     Height 02/28/18 0832 5\' 5"  (1.651 m)     Head Circumference --      Peak Flow --      Pain Score 02/28/18 0831 10     Pain Loc --      Pain Edu? --      Excl. in [place]? --    No data found.  Updated Vital Signs BP 119/73 (BP Location: Left Arm)   Pulse 64   Temp 98.2 F (36.8 C) (Oral)   Resp 16   Ht 5\' 5"  (1.651 m)   Wt 220 lb (99.8 kg)   SpO2 96%   BMI 36.61 kg/m   Visual Acuity Right Eye Distance:   Left Eye Distance:   Bilateral Distance:    Right Eye Near:   Left Eye Near:    Bilateral Near:     Physical Exam  Constitutional: She is oriented to person, place, and time. She appears well-developed and well-nourished. No distress.  HENT:  Head: Normocephalic.  Eyes: Pupils are equal, round, and reactive to light. Right eye exhibits no discharge. Left eye exhibits no discharge.  Neck: Normal range of motion.  Musculoskeletal: Normal range of motion. She exhibits tenderness.  Of the left knee no obvious effusion.  Quadriceps is strong and contracts strongly with good control.  There is no patellofemoral tenderness.  There is a negative patellar apprehension test.  Her ligaments are strong with good endpoints.  Stressing of the medial collateral  ligament causes her discomfort.  Tenderness appears to be over the anterior medial joint line just above and possibly including the proximal tibial plateau.  No anterior drawer sign.  Neurological: She is alert and oriented to person, place, and time.  Skin: Skin is warm and dry. She is not diaphoretic.  Psychiatric: She has a normal mood and affect. Her behavior is normal. Thought content normal.  Nursing note and vitals reviewed.    UC Treatments / Results  Labs (all labs ordered are listed, but only abnormal results are displayed) Labs Reviewed - No data to display  EKG None  Radiology Dg Knee Complete 4 Views Left  Result Date: 02/28/2018 CLINICAL DATA:  Twisting injury.  Medial pain. EXAM: LEFT KNEE - COMPLETE 4+ VIEW COMPARISON:  None FINDINGS: Small joint effusion. Early joint space narrowing and spurring in the patellofemoral compartment. No acute bony abnormality. Specifically, no fracture, subluxation, or dislocation. IMPRESSION: Small joint effusion.  No acute bony abnormality. Electronically Signed   By: Rolm Baptise M.D.   On: 02/28/2018 09:15    Procedures Procedures (including critical care time)  Medications Ordered in UC Medications - No data to display  Initial Impression / Assessment and Plan / UC Course  I have reviewed the triage vital signs and the nursing notes.  Pertinent labs & imaging results that were available during my care of the patient were reviewed by me and considered in my medical decision making (see chart for details).   Reviewed the x-rays with the patient.  Medial joint narrowing but  still has serviceable cartilage.  Likely that she has strained her knee performing an activity that she had not become accustomed to.  Will give her anti-inflammatory medication and have asked her to avoid symptoms as much as possible.  Use the walker to help assist with the ambulation to unload the joint.  If she is not improving she should follow-up with the  orthopedic surgeon next week.  Final Clinical Impressions(s) / UC Diagnoses   Final diagnoses:  Effusion of left knee  Medial joint line tenderness of knee, left     Discharge Instructions     Avoid symptoms as much as possible. Apply ice 20 minutes out of every 2 hours 4-5 times daily for comfort.     ED Prescriptions    Medication Sig Dispense Auth. Provider   meloxicam (MOBIC) 7.5 MG tablet Take 1 tablet (7.5 mg total) by mouth daily. 30 tablet Lorin Picket, PA-C     Controlled Substance Prescriptions [place] Controlled Substance Registry consulted? Not Applicable   Lorin Picket, PA-C 02/28/18 1049

## 2018-02-28 NOTE — Discharge Instructions (Addendum)
Avoid symptoms as much as possible. Apply ice 20 minutes out of every 2 hours 4-5 times daily for comfort.

## 2018-03-14 ENCOUNTER — Other Ambulatory Visit: Payer: Self-pay | Admitting: Orthopedic Surgery

## 2018-03-14 DIAGNOSIS — M25362 Other instability, left knee: Secondary | ICD-10-CM

## 2018-03-14 DIAGNOSIS — M25562 Pain in left knee: Secondary | ICD-10-CM

## 2018-03-26 ENCOUNTER — Ambulatory Visit
Admission: RE | Admit: 2018-03-26 | Discharge: 2018-03-26 | Disposition: A | Discharge status: Home / Self Care | Payer: Medicare Other | Source: Ambulatory Visit | Attending: Orthopedic Surgery | Admitting: Orthopedic Surgery

## 2018-03-26 DIAGNOSIS — M25462 Effusion, left knee: Secondary | ICD-10-CM | POA: Diagnosis not present

## 2018-03-26 DIAGNOSIS — M25362 Other instability, left knee: Secondary | ICD-10-CM

## 2018-03-26 DIAGNOSIS — S83242A Other tear of medial meniscus, current injury, left knee, initial encounter: Secondary | ICD-10-CM | POA: Diagnosis not present

## 2018-03-26 DIAGNOSIS — M84462A Pathological fracture, left tibia, initial encounter for fracture: Secondary | ICD-10-CM | POA: Insufficient documentation

## 2018-03-26 DIAGNOSIS — X58XXXA Exposure to other specified factors, initial encounter: Secondary | ICD-10-CM | POA: Diagnosis not present

## 2018-03-26 DIAGNOSIS — M25562 Pain in left knee: Secondary | ICD-10-CM

## 2018-03-28 ENCOUNTER — Ambulatory Visit: Payer: Medicare Other

## 2019-03-10 ENCOUNTER — Other Ambulatory Visit: Payer: Self-pay | Admitting: Family Medicine

## 2019-03-10 DIAGNOSIS — Z1231 Encounter for screening mammogram for malignant neoplasm of breast: Secondary | ICD-10-CM

## 2019-03-14 ENCOUNTER — Ambulatory Visit
Admission: RE | Admit: 2019-03-14 | Discharge: 2019-03-14 | Disposition: A | Discharge status: Home / Self Care | Payer: Medicare Other | Source: Ambulatory Visit | Attending: Family Medicine | Admitting: Family Medicine

## 2019-03-14 DIAGNOSIS — Z1231 Encounter for screening mammogram for malignant neoplasm of breast: Secondary | ICD-10-CM | POA: Insufficient documentation

## 2019-08-09 HISTORY — PX: CATARACT EXTRACTION W/ INTRAOCULAR LENS  IMPLANT, BILATERAL: SHX1307

## 2019-09-25 ENCOUNTER — Other Ambulatory Visit: Payer: Self-pay

## 2019-09-25 ENCOUNTER — Encounter
Admission: RE | Admit: 2019-09-25 | Discharge: 2019-09-25 | Disposition: A | Discharge status: Home / Self Care | Payer: Medicare Other | Source: Ambulatory Visit | Attending: Orthopedic Surgery | Admitting: Orthopedic Surgery

## 2019-09-25 HISTORY — DX: Chronic obstructive pulmonary disease, unspecified: J44.9

## 2019-09-25 HISTORY — DX: Hyperlipidemia, unspecified: E78.5

## 2019-09-25 HISTORY — DX: Prediabetes: R73.03

## 2019-09-25 HISTORY — DX: Sleep apnea, unspecified: G47.30

## 2019-09-25 NOTE — Patient Instructions (Addendum)
Your procedure is scheduled on: 10/06/19 Report to [place]. To find out your arrival time please call [phone] between 1PM - 3PM on 10/03/19.  Remember: Instructions that are not followed completely may result in serious medical risk, up to and including death, or upon the discretion of your surgeon and anesthesiologist your surgery may need to be rescheduled.     _X__ 1. Do not eat food after midnight the night before your procedure.                 No gum chewing or hard candies. You may drink clear liquids up to 2 hours                 before you are scheduled to arrive for your surgery- DO not drink clear                 liquids within 2 hours of the start of your surgery.                 Clear Liquids include:  water, apple juice without pulp, clear carbohydrate                 drink such as Clearfast or Gatorade, Black Coffee or Tea (Do not add                 anything to coffee or tea). Diabetics water only  __X__2.  On the morning of surgery brush your teeth with toothpaste and water, you                 may rinse your mouth with mouthwash if you wish.  Do not swallow any              toothpaste of mouthwash.     _X__ 3.  No Alcohol for 24 hours before or after surgery.   _X__ 4.  Do Not Smoke or use e-cigarettes For 24 Hours Prior to Your Surgery.                 Do not use any chewable tobacco products for at least 6 hours prior to                 surgery.  ____  5.  Bring all medications with you on the day of surgery if instructed.   __X__  6.  Notify your doctor if there is any change in your medical condition      (cold, fever, infections).     Do not wear jewelry, make-up, hairpins, clips or nail polish. Do not wear lotions, powders, or perfumes.  Do not shave 48 hours prior to surgery. Men may shave face and neck. Do not bring valuables to the hospital.    [facility] is not responsible for any belongings or  valuables.  Contacts, dentures/partials or body piercings may not be worn into surgery. Bring a case for your contacts, glasses or hearing aids, a denture cup will be supplied. Leave your suitcase in the car. After surgery it may be brought to your room. For patients admitted to the hospital, discharge time is determined by your treatment team.   Patients discharged the day of surgery will not be allowed to drive home.   Please read over the following fact sheets that you were given:   MRSA Information  __X__ Take these medicines the morning of surgery with A SIP OF WATER:  1. amLODipine (NORVASC) 5 MG tablet  2.   3.   4.  5.  6.  ____ Fleet Enema (as directed)   __X__ Use CHG Soap/SAGE wipes as directed  ____ Use inhalers on the day of surgery  ____ Stop metformin/Janumet/Farxiga 2 days prior to surgery    ____ Take 1/2 of usual insulin dose the night before surgery. No insulin the morning          of surgery.   ____ Stop Blood Thinners Coumadin/Plavix/Xarelto/Pleta/Pradaxa/Eliquis/Effient/Aspirin  on   Or contact your Surgeon, Cardiologist or Medical Doctor regarding  ability to stop your blood thinners  __X__ Stop Anti-inflammatories 7 days before surgery such as Advil, Ibuprofen, Motrin,  BC or Goodies Powder, Naprosyn, Naproxen, Aleve, Aspirin    __X__ Stop all herbal supplements, fish oil or vitamin E until after surgery.    __X__ Bring C-Pap to the hospital.    Ensure Pre Surgery drink finished 2 hours prior to arrival

## 2019-09-26 ENCOUNTER — Encounter
Admission: RE | Admit: 2019-09-26 | Discharge: 2019-09-26 | Disposition: A | Discharge status: Home / Self Care | Payer: Medicare Other | Source: Ambulatory Visit | Attending: Orthopedic Surgery | Admitting: Orthopedic Surgery

## 2019-09-26 DIAGNOSIS — E785 Hyperlipidemia, unspecified: Secondary | ICD-10-CM | POA: Insufficient documentation

## 2019-09-26 DIAGNOSIS — I10 Essential (primary) hypertension: Secondary | ICD-10-CM | POA: Diagnosis not present

## 2019-09-26 DIAGNOSIS — Z01818 Encounter for other preprocedural examination: Secondary | ICD-10-CM | POA: Diagnosis not present

## 2019-09-26 LAB — POTASSIUM: Potassium: 4.6 mmol/L (ref 3.5–5.1)

## 2019-10-02 ENCOUNTER — Other Ambulatory Visit
Admission: RE | Admit: 2019-10-02 | Discharge: 2019-10-02 | Disposition: A | Discharge status: Home / Self Care | Payer: Medicare Other | Source: Ambulatory Visit | Attending: Orthopedic Surgery | Admitting: Orthopedic Surgery

## 2019-10-02 ENCOUNTER — Other Ambulatory Visit: Payer: Self-pay

## 2019-10-02 DIAGNOSIS — Z20822 Contact with and (suspected) exposure to covid-19: Secondary | ICD-10-CM | POA: Diagnosis not present

## 2019-10-02 DIAGNOSIS — Z01812 Encounter for preprocedural laboratory examination: Secondary | ICD-10-CM | POA: Insufficient documentation

## 2019-10-02 LAB — SARS CORONAVIRUS 2 (TAT 6-24 HRS): SARS Coronavirus 2: NEGATIVE

## 2019-10-04 ENCOUNTER — Encounter: Payer: Self-pay | Admitting: Orthopedic Surgery

## 2019-10-04 DIAGNOSIS — C801 Malignant (primary) neoplasm, unspecified: Secondary | ICD-10-CM | POA: Insufficient documentation

## 2019-10-04 NOTE — H&P (Signed)
ORTHOPAEDIC HISTORY & PHYSICAL Progress Notes Gwenlyn Fudge, PA - 09/30/2019 10:00 AM EDT [facility] Chief Complaint:   Chief Complaint  Patient presents with  . Knee Pain  H & P LEFT KNEE   History of Present Illness:   Kelsey Mendoza is a 69 y.o. female that presents to clinic today for her preoperative history and evaluation. Patient presents unaccompanied. The patient is scheduled to undergo a left knee arthroscopy on 10/06/19 by Dr. Marry Guan. Her pain began around 2 years ago. The pain is located along the medial aspect of the knee. She describes her pain as worse with lateral movements, pivoting, and squatting. She reports associated mild swelling with some giving way of the knee. She denies associated numbness or tingling, denies locking of the knee.   The patient's symptoms have progressed to the point that they decrease her quality of life. The patient has previously undergone conservative treatment including NSAIDS and injections to the knee without adequate control of her symptoms.  Denies history of blood clots, significant cardiac history. She may have had a lumbar surgery, but states she had no hardware placed.   Past Medical, Surgical, Family, Social History, Allergies, Medications:   Past Medical History:  Past Medical History:  Diagnosis Date  . Borderline diabetes  A1C 6.1%- 07/2013; diet-controlled  . Broken wrist, right, sequela  . Essential hypertension 09/03/2013  . Hx of bladder cancer  . Hyperlipidemia  LDL 159- 07/2013  . Hypertension  . Insomnia  . Menopausal syndrome  Hx of menopausal syndrome  . Obesity  . Osteoarthritis  . Sleep apnea  Uses CPAP machine   Past Surgical History:  Past Surgical History:  Procedure Laterality Date  . BIOPSY SKIN ARM Bilateral  Left arm- Precancerous  . BIOPSY SKIN LEG Right  Right leg-Cancerous  . Bladder cancer surgeries  x 3  . CATARACT EXTRACTION W/  INTRAOCULAR LENS IMPLANT & ANTERIOR VITRECTOMY, BILATERAL 08/2019  Right 08/19/19. Left 09/02/19  . COLONOSCOPY 07/16/12  diverticulosis in sigmoid, descending, transverse, and ascending colon and cecum  . HYSTERECTOMY  Vaginal hysterectomy  . JOINT REPLACEMENT Right 12/29/2016  Right medial Makoplasty  . Lumbar back surgery  . ORIF DISTAL RADIUS FRACTURE 06/06/2017  Dr. Elenore Paddy  . Right knee surgery Right  . TURP VAPORIZATION   Current Medications:  Current Outpatient Medications  Medication Sig Dispense Refill  . clobetasoL (CORMAX) 0.05 % external solution Apply topically 2 (two) times daily  . amLODIPine (NORVASC) 5 MG tablet TAKE 1 TABLET BY MOUTH EVERY DAY 90 tablet 1  . FUROsemide (LASIX) 40 MG tablet TAKE 1 TABLET (40 MG TOTAL) BY MOUTH ONCE DAILY AS NEEDED FOR EDEMA. 90 tablet 1  . ketoconazole (NIZORAL) 2 % shampoo Apply topically once daily  . loratadine (CLARITIN) 10 mg tablet Take 10 mg by mouth once daily  . losartan (COZAAR) 100 MG tablet TAKE 1 TABLET BY MOUTH EVERY DAY 90 tablet 3  . prednisoLONE acetate (PRED FORTE) 1 % ophthalmic suspension Place 1 drop into both eyes 2 (two) times daily  . rosuvastatin (CRESTOR) 5 MG tablet TAKE 1 TABLET BY MOUTH EVERY DAY 90 tablet 3  . triamterene-hydrochlorothiazide (MAXZIDE-25) 37.5-25 mg tablet TAKE 1 TABLET BY MOUTH EVERY DAY 90 tablet 1  . urea (CARMOL) 20 % cream Apply topically as needed   No current facility-administered medications for this visit.   Allergies:  Allergies  Allergen Reactions  . Trazodone Other (See Comments)  Caused violent, disturbing nightmares   Social History:  Social History   Socioeconomic History  . Marital status: Widowed  Spouse name: Not on file  . Number of children: 3  . Years of education: 79  . Highest education level: Not on file  Occupational History  . Occupation: Retired  Tobacco Use  . Smoking status: Former Smoker  Packs/day: 1.00  Years: 10.00  Pack years: 10.00   Types: Cigarettes  Quit date: 04/10/1985  Years since quitting: 34.4  . Smokeless tobacco: Never Used  Vaping Use  . Vaping Use: Never used  Substance and Sexual Activity  . Alcohol use: No  Alcohol/week: 0.0 standard drinks  . Drug use: No  . Sexual activity: Defer  Other Topics Concern  . Not on file  Social History Narrative  Marital Status- Married  Lives with husband  Employment- Retired  Exercise hx- Psychologist, counselling daily  [place]   Social Determinants of Health   Financial Resource Strain:  . Difficulty of Paying Living Expenses:  Food Insecurity:  . Worried About Charity fundraiser in the Last Year:  . Arboriculturist in the Last Year:  Transportation Needs:  . Film/video editor (Medical):  Marland Kitchen Lack of Transportation (Non-Medical):  Physical Activity:  . Days of Exercise per Week:  . Minutes of Exercise per Session:  Stress:  . Feeling of Stress :  Social Connections:  . Frequency of Communication with Friends and Family:  . Frequency of Social Gatherings with Friends and Family:  . Attends Religious Services:  . Active Member of Clubs or Organizations:  . Attends Archivist Meetings:  Marland Kitchen Marital Status:   Family History:  Family History  Problem Relation Age of Onset  . Stroke Mother  . Heart disease Mother  . Myocardial Infarction (Heart attack) Mother  . Heart disease Father  . No Known Problems Sister  . No Known Problems Brother   Review of Systems:   A 10+ ROS was performed, reviewed, and the pertinent orthopaedic findings are documented in the HPI.   Physical Examination:   BP 124/84  Ht 162.6 cm (5\' 4" )  Wt 99.2 kg (218 lb 9.6 oz)  LMP (LMP Unknown) Comment: Hysterectomy  BMI 37.52 kg/m   Patient is a well-developed, well-nourished female in no acute distress. Patient has normal mood and affect. Patient is alert and oriented to person, place, and time.   HEENT: Atraumatic, normocephalic. Pupils equal and  reactive to light. Extraocular motion intact. Noninjected sclera.  Cardiovascular: Regular rate and rhythm, with no murmurs, rubs, or gallops. Distal pulses palpable.  Respiratory: Lungs clear to auscultation bilaterally.   Left Knee:  Soft tissue swelling: none Effusion: none Erythema: none Crepitance: none Tenderness: medial Alignment: normal Mediolateral laxity: stable Anterior drawer test:negative Lachman`s test: negative McMurray`s test: positive Atrophy: No significant atrophy.  Quadriceps tone was fair to good. Range of Motion: 0/0/125 degrees  Sensation intact over the saphenous, lateral sural cutaneous, superficial fibular, and deep fibular nerve distributions.  Tests Performed/Reviewed:  X-rays  MRI OF THE LEFT KNEE WITHOUT CONTRAST   TECHNIQUE:  Multiplanar, multisequence MR imaging of the knee was performed. No  intravenous contrast was administered.   COMPARISON: None.   FINDINGS:  MENISCI   Medial meniscus: Radial tear of the posterior horn-body junction of  medial meniscus. Oblique tear of the posterior horn of medial  meniscus extending to the superior articular surface. Degeneration  of the anterior horn of the medial meniscus.  Lateral meniscus: Intact.   LIGAMENTS   Cruciates: Intact ACL and PCL.   Collaterals: Medial collateral ligament is intact. Lateral  collateral ligament complex is intact.   CARTILAGE   Patellofemoral: No chondral defect.   Medial: Full-thickness cartilage loss of the medial femorotibial  compartment.   Lateral: No chondral defect.   Joint: Large joint effusion. Normal Hoffa's fat. No plical  thickening.   Popliteal Fossa: Small Baker's cyst. Intact popliteus tendon.   Extensor Mechanism: Intact quadriceps tendon. Intact patellar  tendon. Intact medial patellar retinaculum. Intact lateral patellar  retinaculum. Intact MPFL.   Bones: Subchondral linear low signal in the medial tibial plateau  with  severe surrounding marrow edema most concerning for a subtle  insufficiency fracture without depression or displacement.   Other: No fluid collection or hematoma. Muscle edema in  semimembranosus muscle concerning for a partial tear and muscle  strain.   IMPRESSION:  1. Radial tear of the posterior horn-body junction of medial  meniscus. Oblique tear of the posterior horn of medial meniscus  extending to the superior articular surface. Degeneration of the  anterior horn of the medial meniscus.  2. Full-thickness cartilage loss of the medial femorotibial  compartment.  3. Nondisplaced, nondepressed subtle subchondral insufficiency  fracture of the medial tibial plateau.  4. Small partial tear with muscle strain of the semimembranosus  muscle at the musculotendinous junction.   Electronically Signed  By: Kathreen Devoid  On: 03/26/2018 12:46  No new radiographs were obtained today. Previous radiographs were reviewed of the left knee and revealed mild to moderate loss of medial compartment joint space with mild osteophyte formation noted. Lateral compartment appears relatively preserved. Patellofemoral compartment appears relatively well-preserved. No fractures or dislocations.  Impression:   ICD-10-CM  1. Internal derangement of left knee M23.92  2. Primary osteoarthritis of left knee M17.12   Plan:   The patient has mild to moderate degenerative changes of the medial compartment of the left knee as well as meniscal pathology. Having failed conservative treatment, the patient has elected to proceed with a total joint arthroplasty. The patient will undergo a left knee arthroscopy with Dr. Marry Guan. The patient elects to proceed with surgery. The patient is instructed to stop all blood thinners prior to surgery. The patient is instructed to call the hospital the day before surgery to learn of the proper arrival time.   Contact our office with any questions or concerns. Follow up as  indicated, or sooner should any new problems arise, if conditions worsen, or if they are otherwise concerned.   Gwenlyn Fudge, PA-C [facility] [address] [place], [place] [address] Phone: [phone]  This note was generated in part with voice recognition software and I apologize for any typographical errors that were not detected and corrected.   Electronically signed by Gwenlyn Fudge, PA at 09/30/2019 10:17 AM EDT

## 2019-10-06 ENCOUNTER — Ambulatory Visit
Admission: RE | Admit: 2019-10-06 | Discharge: 2019-10-06 | Disposition: A | Discharge status: Home / Self Care | Payer: Medicare Other | Attending: Orthopedic Surgery | Admitting: Orthopedic Surgery

## 2019-10-06 ENCOUNTER — Ambulatory Visit: Payer: Medicare Other | Admitting: Urgent Care

## 2019-10-06 ENCOUNTER — Ambulatory Visit: Payer: Medicare Other | Admitting: Certified Registered"

## 2019-10-06 ENCOUNTER — Other Ambulatory Visit: Payer: Self-pay

## 2019-10-06 ENCOUNTER — Encounter
Admission: RE | Disposition: A | Discharge status: Home / Self Care | Payer: Self-pay | Source: Home / Self Care | Attending: Orthopedic Surgery

## 2019-10-06 ENCOUNTER — Encounter: Payer: Self-pay | Admitting: Orthopedic Surgery

## 2019-10-06 DIAGNOSIS — Z8551 Personal history of malignant neoplasm of bladder: Secondary | ICD-10-CM | POA: Insufficient documentation

## 2019-10-06 DIAGNOSIS — X58XXXA Exposure to other specified factors, initial encounter: Secondary | ICD-10-CM | POA: Diagnosis not present

## 2019-10-06 DIAGNOSIS — L409 Psoriasis, unspecified: Secondary | ICD-10-CM | POA: Insufficient documentation

## 2019-10-06 DIAGNOSIS — G4730 Sleep apnea, unspecified: Secondary | ICD-10-CM | POA: Insufficient documentation

## 2019-10-06 DIAGNOSIS — S83242A Other tear of medial meniscus, current injury, left knee, initial encounter: Secondary | ICD-10-CM | POA: Insufficient documentation

## 2019-10-06 DIAGNOSIS — M2392 Unspecified internal derangement of left knee: Secondary | ICD-10-CM | POA: Diagnosis not present

## 2019-10-06 DIAGNOSIS — Z87891 Personal history of nicotine dependence: Secondary | ICD-10-CM | POA: Insufficient documentation

## 2019-10-06 DIAGNOSIS — M1712 Unilateral primary osteoarthritis, left knee: Secondary | ICD-10-CM | POA: Diagnosis not present

## 2019-10-06 DIAGNOSIS — E785 Hyperlipidemia, unspecified: Secondary | ICD-10-CM | POA: Diagnosis not present

## 2019-10-06 DIAGNOSIS — I10 Essential (primary) hypertension: Secondary | ICD-10-CM | POA: Insufficient documentation

## 2019-10-06 DIAGNOSIS — Z6836 Body mass index (BMI) 36.0-36.9, adult: Secondary | ICD-10-CM | POA: Diagnosis not present

## 2019-10-06 DIAGNOSIS — E669 Obesity, unspecified: Secondary | ICD-10-CM | POA: Diagnosis not present

## 2019-10-06 DIAGNOSIS — M94262 Chondromalacia, left knee: Secondary | ICD-10-CM | POA: Diagnosis not present

## 2019-10-06 DIAGNOSIS — J449 Chronic obstructive pulmonary disease, unspecified: Secondary | ICD-10-CM | POA: Diagnosis not present

## 2019-10-06 DIAGNOSIS — Z79899 Other long term (current) drug therapy: Secondary | ICD-10-CM | POA: Diagnosis not present

## 2019-10-06 DIAGNOSIS — Z9889 Other specified postprocedural states: Secondary | ICD-10-CM

## 2019-10-06 HISTORY — PX: KNEE ARTHROSCOPY: SHX127

## 2019-10-06 SURGERY — ARTHROSCOPY, KNEE
Anesthesia: General | Site: Knee | Laterality: Left

## 2019-10-06 MED ORDER — MORPHINE SULFATE 4 MG/ML IJ SOLN
INTRAMUSCULAR | Status: DC | PRN
  Administered 2019-10-06: 4 mg via INTRAMUSCULAR

## 2019-10-06 MED ORDER — ONDANSETRON HCL 4 MG/2ML IJ SOLN: 4.0000 mg | Freq: Once | INTRAMUSCULAR | Status: DC | PRN

## 2019-10-06 MED ORDER — FENTANYL CITRATE (PF) 100 MCG/2ML IJ SOLN
INTRAMUSCULAR | Status: DC | PRN
  Administered 2019-10-06 (×3): 25 ug via INTRAVENOUS
  Administered 2019-10-06 (×2): 50 ug via INTRAVENOUS
  Administered 2019-10-06: 25 ug via INTRAVENOUS

## 2019-10-06 MED ORDER — CEFAZOLIN SODIUM-DEXTROSE 2-4 GM/100ML-% IV SOLN
INTRAVENOUS | Status: AC
  Filled 2019-10-06: qty 100

## 2019-10-06 MED ORDER — MIDAZOLAM HCL 2 MG/2ML IJ SOLN
INTRAMUSCULAR | Status: AC
  Filled 2019-10-06: qty 2

## 2019-10-06 MED ORDER — CEFAZOLIN SODIUM-DEXTROSE 2-4 GM/100ML-% IV SOLN
2.0000 g | INTRAVENOUS | Status: AC
  Administered 2019-10-06: 2 g via INTRAVENOUS

## 2019-10-06 MED ORDER — FENTANYL CITRATE (PF) 100 MCG/2ML IJ SOLN
INTRAMUSCULAR | Status: AC
  Filled 2019-10-06: qty 2

## 2019-10-06 MED ORDER — ONDANSETRON HCL 4 MG/2ML IJ SOLN
INTRAMUSCULAR | Status: AC
  Filled 2019-10-06: qty 2

## 2019-10-06 MED ORDER — EPHEDRINE SULFATE 50 MG/ML IJ SOLN
INTRAMUSCULAR | Status: DC | PRN
  Administered 2019-10-06 (×2): 10 mg via INTRAVENOUS

## 2019-10-06 MED ORDER — CHLORHEXIDINE GLUCONATE 0.12 % MT SOLN
OROMUCOSAL | Status: AC
  Administered 2019-10-06: 15 mL via OROMUCOSAL
  Filled 2019-10-06: qty 15

## 2019-10-06 MED ORDER — FENTANYL CITRATE (PF) 100 MCG/2ML IJ SOLN
INTRAMUSCULAR | Status: AC
  Administered 2019-10-06: 25 ug via INTRAVENOUS
  Filled 2019-10-06: qty 2

## 2019-10-06 MED ORDER — MIDAZOLAM HCL 2 MG/2ML IJ SOLN
INTRAMUSCULAR | Status: DC | PRN
  Administered 2019-10-06: 2 mg via INTRAVENOUS

## 2019-10-06 MED ORDER — EPHEDRINE 5 MG/ML INJ
INTRAVENOUS | Status: AC
  Filled 2019-10-06: qty 10

## 2019-10-06 MED ORDER — PROPOFOL 10 MG/ML IV BOLUS
INTRAVENOUS | Status: DC | PRN
  Administered 2019-10-06: 150 mg via INTRAVENOUS

## 2019-10-06 MED ORDER — PROPOFOL 10 MG/ML IV BOLUS
INTRAVENOUS | Status: AC
  Filled 2019-10-06: qty 20

## 2019-10-06 MED ORDER — LACTATED RINGERS IV SOLN: INTRAVENOUS | Status: DC

## 2019-10-06 MED ORDER — FAMOTIDINE 20 MG PO TABS
ORAL_TABLET | ORAL | Status: AC
  Administered 2019-10-06: 20 mg via ORAL
  Filled 2019-10-06: qty 1

## 2019-10-06 MED ORDER — BUPIVACAINE-EPINEPHRINE 0.25% -1:200000 IJ SOLN
INTRAMUSCULAR | Status: DC | PRN
  Administered 2019-10-06: 30 mL

## 2019-10-06 MED ORDER — GLYCOPYRROLATE 0.2 MG/ML IJ SOLN
INTRAMUSCULAR | Status: AC
  Filled 2019-10-06: qty 1

## 2019-10-06 MED ORDER — FAMOTIDINE 20 MG PO TABS: 20.0000 mg | ORAL_TABLET | Freq: Once | ORAL | Status: AC

## 2019-10-06 MED ORDER — LIDOCAINE HCL (PF) 2 % IJ SOLN
INTRAMUSCULAR | Status: AC
  Filled 2019-10-06: qty 5

## 2019-10-06 MED ORDER — PHENYLEPHRINE HCL (PRESSORS) 10 MG/ML IV SOLN
INTRAVENOUS | Status: DC | PRN
  Administered 2019-10-06: 100 ug via INTRAVENOUS
  Administered 2019-10-06: 50 ug via INTRAVENOUS

## 2019-10-06 MED ORDER — DEXAMETHASONE SODIUM PHOSPHATE 10 MG/ML IJ SOLN
INTRAMUSCULAR | Status: AC
  Filled 2019-10-06: qty 1

## 2019-10-06 MED ORDER — ORAL CARE MOUTH RINSE: 15.0000 mL | Freq: Once | OROMUCOSAL | Status: AC

## 2019-10-06 MED ORDER — FENTANYL CITRATE (PF) 100 MCG/2ML IJ SOLN
25.0000 ug | INTRAMUSCULAR | Status: AC | PRN
  Administered 2019-10-06 (×4): 25 ug via INTRAVENOUS

## 2019-10-06 MED ORDER — GLYCOPYRROLATE 0.2 MG/ML IJ SOLN
INTRAMUSCULAR | Status: DC | PRN
  Administered 2019-10-06: .2 mg via INTRAVENOUS

## 2019-10-06 MED ORDER — CHLORHEXIDINE GLUCONATE 0.12 % MT SOLN: 15.0000 mL | Freq: Once | OROMUCOSAL | Status: AC

## 2019-10-06 MED ORDER — LIDOCAINE HCL (CARDIAC) PF 100 MG/5ML IV SOSY
PREFILLED_SYRINGE | INTRAVENOUS | Status: DC | PRN
  Administered 2019-10-06: 60 mg via INTRAVENOUS

## 2019-10-06 MED ORDER — DEXAMETHASONE SODIUM PHOSPHATE 10 MG/ML IJ SOLN
INTRAMUSCULAR | Status: DC | PRN
  Administered 2019-10-06: 8 mg via INTRAVENOUS

## 2019-10-06 MED ORDER — ACETAMINOPHEN 10 MG/ML IV SOLN
INTRAVENOUS | Status: AC
  Filled 2019-10-06: qty 100

## 2019-10-06 MED ORDER — IBUPROFEN 800 MG PO TABS: 800.0000 mg | ORAL_TABLET | Freq: Three times a day (TID) | ORAL | 0 refills | Status: DC | PRN

## 2019-10-06 MED ORDER — ONDANSETRON HCL 4 MG/2ML IJ SOLN
INTRAMUSCULAR | Status: DC | PRN
  Administered 2019-10-06: 4 mg via INTRAVENOUS

## 2019-10-06 MED ORDER — ACETAMINOPHEN 10 MG/ML IV SOLN
INTRAVENOUS | Status: DC | PRN
  Administered 2019-10-06: 1000 mg via INTRAVENOUS

## 2019-10-06 SURGICAL SUPPLY — 28 items
ADAPTER IRRIG TUBE 2 SPIKE SOL (ADAPTER) ×6 IMPLANT
BLADE SHAVER 4.5 DBL SERAT CV (CUTTER) IMPLANT
COVER WAND RF STERILE (DRAPES) ×3 IMPLANT
CUFF TOURN SGL QUICK 24 (TOURNIQUET CUFF) ×2
CUFF TOURN SGL QUICK 30 (TOURNIQUET CUFF)
CUFF TRNQT CYL 24X4X16.5-23 (TOURNIQUET CUFF) IMPLANT
CUFF TRNQT CYL 30X4X21-28X (TOURNIQUET CUFF) IMPLANT
DRSG DERMACEA 8X12 NADH (GAUZE/BANDAGES/DRESSINGS) ×3 IMPLANT
DURAPREP 26ML APPLICATOR (WOUND CARE) ×6 IMPLANT
GAUZE SPONGE 4X4 12PLY STRL (GAUZE/BANDAGES/DRESSINGS) ×3 IMPLANT
GLOVE BIOGEL M STRL SZ7.5 (GLOVE) ×3 IMPLANT
GLOVE INDICATOR 8.0 STRL GRN (GLOVE) ×3 IMPLANT
GOWN STRL REUS W/ TWL LRG LVL3 (GOWN DISPOSABLE) ×2 IMPLANT
GOWN STRL REUS W/TWL LRG LVL3 (GOWN DISPOSABLE) ×4
IV LACTATED RINGER IRRG 3000ML (IV SOLUTION) ×12
IV LR IRRIG 3000ML ARTHROMATIC (IV SOLUTION) ×6 IMPLANT
KIT TURNOVER KIT A (KITS) ×3 IMPLANT
MANIFOLD NEPTUNE II (INSTRUMENTS) ×3 IMPLANT
PACK KNEE ARTHRO (MISCELLANEOUS) ×3 IMPLANT
SET TUBE SUCT SHAVER OUTFL 24K (TUBING) ×3 IMPLANT
SET TUBE TIP INTRA-ARTICULAR (MISCELLANEOUS) ×3 IMPLANT
SOL PREP PVP 2OZ (MISCELLANEOUS) ×3
SOLUTION PREP PVP 2OZ (MISCELLANEOUS) ×1 IMPLANT
SUT ETHILON 3-0 FS-10 30 BLK (SUTURE) ×3
SUTURE EHLN 3-0 FS-10 30 BLK (SUTURE) ×1 IMPLANT
TUBING ARTHRO INFLOW-ONLY STRL (TUBING) ×3 IMPLANT
WAND HAND CNTRL MULTIVAC 50 (MISCELLANEOUS) ×3 IMPLANT
WRAP KNEE W/COLD PACKS 25.5X14 (SOFTGOODS) ×3 IMPLANT

## 2019-10-06 NOTE — Anesthesia Preprocedure Evaluation (Signed)
Anesthesia Evaluation  Patient identified by MRN, date of birth, ID band Patient awake    Reviewed: Allergy & Precautions, H&P , NPO status , reviewed documented beta blocker date and time   Airway Mallampati: III  TM Distance: >3 FB     Dental  (+) Upper Dentures, Partial Lower, Dental Advidsory Given   Pulmonary neg shortness of breath, sleep apnea and Continuous Positive Airway Pressure Ventilation , neg COPD, neg recent URI, former smoker,    Pulmonary exam normal        Cardiovascular Exercise Tolerance: Good hypertension, (-) angina(-) Past MI and (-) Cardiac Stents Normal cardiovascular exam(-) dysrhythmias (-) Valvular Problems/Murmurs     Neuro/Psych PSYCHIATRIC DISORDERS Depression negative neurological ROS     GI/Hepatic Neg liver ROS, GERD  Controlled,  Endo/Other  diabetes (Pre-diabetic)  Renal/GU negative Renal ROS     Musculoskeletal   Abdominal   Peds  Hematology negative hematology ROS (+)   Anesthesia Other Findings Past Medical History: 2000s: Cancer ([place])     Comment:  bladder No date: COPD (chronic obstructive pulmonary disease) (HCC) No date: CPAP (continuous positive airway pressure) dependence No date: HLD (hyperlipidemia) No date: Hypertension No date: Knee pain No date: Pre-diabetes No date: Psoriasis of scalp No date: Sleep apnea   Reproductive/Obstetrics                             Anesthesia Physical  Anesthesia Plan  ASA: II  Anesthesia Plan: General LMA   Post-op Pain Management:    Induction: Intravenous  PONV Risk Score and Plan: 3 and 4 or greater and Ondansetron, Midazolam and Dexamethasone  Airway Management Planned: LMA  Additional Equipment:   Intra-op Plan:   Post-operative Plan: Extubation in OR  Informed Consent: I have reviewed the patients History and Physical, chart, labs and discussed the procedure including the risks,  benefits and alternatives for the proposed anesthesia with the patient or authorized representative who has indicated his/her understanding and acceptance.     Dental Advisory Given  Plan Discussed with: CRNA  Anesthesia Plan Comments:         Anesthesia Quick Evaluation

## 2019-10-06 NOTE — Transfer of Care (Signed)
Immediate Anesthesia Transfer of Care Note  Patient: Kelsey Mendoza  Procedure(s) Performed: ARTHROSCOPY KNEE,PARTIAL MEDIAL MENISECTOMY,PARTIAL CHONDROPLASTY (Left Knee)  Patient Location: PACU  Anesthesia Type:General  Level of Consciousness: drowsy  Airway & Oxygen Therapy: Patient Spontanous Breathing and Patient connected to nasal cannula oxygen  Post-op Assessment: Report given to RN  Post vital signs: stable  Last Vitals:  Vitals Value Taken Time  BP 121/73 10/06/19 1700  Temp    Pulse 100 10/06/19 1702  Resp 16 10/06/19 1702  SpO2 98 % 10/06/19 1702  Vitals shown include unvalidated device data.  Last Pain:  Vitals:   10/06/19 1435  TempSrc: Tympanic  PainSc: 0-No pain         Complications: No complications documented.

## 2019-10-06 NOTE — Anesthesia Procedure Notes (Signed)
Procedure Name: LMA Insertion Date/Time: 10/06/2019 1:25 PM Performed by: Lerry Liner, CRNA Pre-anesthesia Checklist: Patient identified, Emergency Drugs available, Suction available, Patient being monitored and Timeout performed Patient Re-evaluated:Patient Re-evaluated prior to induction Oxygen Delivery Method: Circle system utilized Preoxygenation: Pre-oxygenation with 100% oxygen Induction Type: IV induction Ventilation: Mask ventilation without difficulty LMA: LMA inserted LMA Size: 4.0 Number of attempts: 1 Tube secured with: Tape Dental Injury: Teeth and Oropharynx as per pre-operative assessment

## 2019-10-06 NOTE — Op Note (Signed)
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   ASSISTANT: none  ANESTHESIA: general  ESTIMATED BLOOD LOSS: Minimal  FLUIDS REPLACED: 800 mL of crystalloid  TOURNIQUET TIME: Not used  INDICATIONS FOR SURGERY: Kelsey Mendoza is a 69 y.o. year old female who has been seen for complaints of left knee pain. MRI demonstrated findings consistent with meniscal pathology. After discussion of the risks and benefits of surgical intervention, the patient expressed understanding of the risks benefits and agree with plans for left knee arthroscopy.   PROCEDURE IN DETAIL: The patient was brought into the operating room and, after adequate general anesthesia was achieved, a tourniquet was applied to the left thigh and the leg was placed in the leg holder. All bony prominences were well padded. The patient's left knee was cleaned and prepped with alcohol and Duraprep and draped in the usual sterile fashion. A "timeout" was performed as per usual protocol. The anticipated portal sites were injected with 0.25% Marcaine with epinephrine. An anterolateral incision was made and a cannula was inserted. A small effusion was evacuated and the knee was distended with fluid using the pump. The scope was advanced down the medial gutter into the medial compartment. Under visualization with the scope, an anteromedial portal was created and a hooked probe was inserted. The medial meniscus was visualized and probed.  There was a flap type lesion to the posterior horn of the medial meniscus that had flipped  onto itself.  The flap was reduced and excised using combination of meniscal punches and a 4.5 incisor shaver.  Degenerative changes to the remainder of the posterior horn were also debrided using meniscal punches and the incisor shaver.  Final contouring was performed using a 50 degree ArthroCare wand.  The remaining rim of meniscus was visualized and probed felt be stable.  The articular cartilage was visualized.  There were areas of grade 2-3 changes of chondromalacia involving both the medial femoral condyle and medial tibial plateau.  These areas were debrided and contoured using the ArthroCare wand.  The scope was then advanced into the intercondylar notch. The anterior cruciate ligament was visualized and probed and felt to be intact. The scope was removed from the lateral portal and reinserted via the anteromedial portal to better visualize the lateral compartment. The lateral meniscus was visualized and probed.  The meniscus was stable without evidence of tear. The articular cartilage of the lateral compartment was visualized and noted to be in good condition.  Finally, the scope was advanced so as to visualize the patellofemoral articulation. Good patellar tracking was appreciated.  Mild changes to the articular surface were noted.  The knee was irrigated with copius amounts of fluid and suctioned dry. The anterolateral portal was re-approximated with #3-0 nylon. A combination of 0.25% Marcaine with epinephrine and 4 mg of Morphine were injected via the scope. The scope was removed and the anteromedial portal was re-approximated with #3-0 nylon. A sterile dressing was applied followed by application of an ice wrap.  The patient tolerated the procedure well and was transported to the PACU in stable condition.  Kelsey Mendoza P. Holley Bouche., M.D.

## 2019-10-06 NOTE — H&P (Signed)
The patient has been re-examined, and the chart reviewed, and there have been no interval changes to the documented history and physical.    The risks, benefits, and alternatives have been discussed at length. The patient expressed understanding of the risks benefits and agreed with plans for surgical intervention.  Shamyia Grandpre P. Keziah Avis, Jr. M.D.    

## 2019-10-06 NOTE — Discharge Instructions (Signed)
AMBULATORY SURGERY  DISCHARGE INSTRUCTIONS   1) The drugs that you were given will stay in your system until tomorrow so for the next 24 hours you should not:  A) Drive an automobile B) Make any legal decisions C) Drink any alcoholic beverage   2) You may resume regular meals tomorrow.  Today it is better to start with liquids and gradually work up to solid foods.  You may eat anything you prefer, but it is better to start with liquids, then soup and crackers, and gradually work up to solid foods.   3) Please notify your doctor immediately if you have any unusual bleeding, trouble breathing, redness and pain at the surgery site, drainage, fever, or pain not relieved by medication.    4) Additional Instructions:        Please contact your physician with any problems or Same Day Surgery at [phone], Monday through Friday 6 am to 4 pm, or [facility] [address] [facility] number at [phone]. Instructions after Knee Arthroscopy    James P. Hooten, Jr., M.D.     Dept. of Orthopaedics & Sports Medicine  [facility]  [address]  [place], [place]  [address]   Phone: [phone]   Fax: [phone]   DIET: . Drink plenty of non-alcoholic fluids & begin a light diet. . Resume your normal diet the day after surgery.  ACTIVITY:  . You may use crutches or a walker with weight-bearing as tolerated, unless instructed otherwise. . You may wean yourself off of the walker or crutches as tolerated.  . Begin doing gentle exercises. Exercising will reduce the pain and swelling, increase motion, and prevent muscle weakness.   . Avoid strenuous activities or athletics for a minimum of 4-6 weeks after arthroscopic surgery. . Do not drive or operate any equipment until instructed.  WOUND CARE:  . Place one to two pillows under the knee the first day or two when sitting or lying.  . Continue to use the ice packs periodically to reduce pain and swelling. . The small incisions  in your knee are closed with nylon stitches. The stitches will be removed in the office. . The bulky dressing may be removed on the second day after surgery. DO NOT TOUCH THE STITCHES. Put a Band-Aid over each stitch. Do NOT use any ointments or creams on the incisions.  . You may bathe or shower after the stitches are removed at the first office visit following surgery.  MEDICATIONS: . You may resume your regular medications. . Please take the pain medication as prescribed. . Do not take pain medication on an empty stomach. . Do not drive or drink alcoholic beverages when taking pain medications.  CALL THE OFFICE FOR: . Temperature above 101 degrees . Excessive bleeding or drainage on the dressing. . Excessive swelling, coldness, or paleness of the toes. . Persistent nausea and vomiting.  FOLLOW-UP:  . You should have an appointment to return to the office in 7-10 days after surgery.      [facility] Department Directory         [email or web address]       [email or web address]          Cardiology  Appointments: [place] - [phone] [place] - [phone]  Endocrinology  Appointments: [place] - [phone] [place] - [phone]  Gastroenterology  Appointments: [place] - [phone] [place] - [phone]        General Surgery   Appointments: [place] - [phone]  Internal Medicine/Family Medicine  Appointments: [place] - [phone]   [place] - [phone] [place] - [phone]  Metabolic and Weigh Loss Surgery  Appointments: [place] - [phone]        Neurology  Appointments: [place] - [phone] [place] - [phone]  Neurosurgery  Appointments: [place] - [phone]  Obstetrics & Gynecology  Appointments: [place] - [phone] [place] - [phone]        Pediatrics  Appointments: [place] - [phone] [place] - [phone]  Physiatry  Appointments: [place]  -[phone]  Physical Therapy  Appointments: [place] - [phone] [place] - [phone]        Podiatry  Appointments: [place] - [phone] [place] - [phone]  Pulmonology  Appointments: [place] - [phone]  Rheumatology  Appointments: [place] - [phone]        [place] Location: [facility]  [address] [place], [place]  [address]  [place] Location: [facility] [address] [place], [place]  [address]  [place] Location: [facility] [address] [place], [place]  [address]               

## 2019-10-07 ENCOUNTER — Encounter: Payer: Self-pay | Admitting: Orthopedic Surgery

## 2019-10-07 NOTE — Anesthesia Postprocedure Evaluation (Signed)
Anesthesia Post Note  Patient: Kelsey Mendoza  Procedure(s) Performed: ARTHROSCOPY KNEE,PARTIAL MEDIAL MENISECTOMY,PARTIAL CHONDROPLASTY (Left Knee)  Patient location during evaluation: PACU Anesthesia Type: General Level of consciousness: awake and alert Pain management: pain level controlled Vital Signs Assessment: post-procedure vital signs reviewed and stable Respiratory status: spontaneous breathing, nonlabored ventilation and respiratory function stable Cardiovascular status: blood pressure returned to baseline and stable Postop Assessment: no apparent nausea or vomiting Anesthetic complications: no   No complications documented.   Last Vitals:  Vitals:   10/06/19 1800 10/06/19 1829  BP: 116/76 113/60  Pulse: 67 60  Resp: 14 16  Temp: (!) 36.4 C (!) 36.2 C  SpO2: 95% 96%    Last Pain:  Vitals:   10/06/19 1829  TempSrc:   PainSc: [name]

## 2019-11-10 ENCOUNTER — Ambulatory Visit (INDEPENDENT_AMBULATORY_CARE_PROVIDER_SITE_OTHER): Payer: Medicare Other | Admitting: Ophthalmology

## 2019-11-10 ENCOUNTER — Encounter (INDEPENDENT_AMBULATORY_CARE_PROVIDER_SITE_OTHER): Payer: Self-pay | Admitting: Ophthalmology

## 2019-11-10 ENCOUNTER — Other Ambulatory Visit: Payer: Self-pay

## 2019-11-10 DIAGNOSIS — E119 Type 2 diabetes mellitus without complications: Secondary | ICD-10-CM | POA: Diagnosis not present

## 2019-11-10 DIAGNOSIS — H40003 Preglaucoma, unspecified, bilateral: Secondary | ICD-10-CM

## 2019-11-10 DIAGNOSIS — H35352 Cystoid macular degeneration, left eye: Secondary | ICD-10-CM | POA: Insufficient documentation

## 2019-11-10 DIAGNOSIS — H35372 Puckering of macula, left eye: Secondary | ICD-10-CM | POA: Insufficient documentation

## 2019-11-10 DIAGNOSIS — H35371 Puckering of macula, right eye: Secondary | ICD-10-CM | POA: Diagnosis not present

## 2019-11-10 NOTE — Progress Notes (Signed)
11/10/2019     CHIEF COMPLAINT Patient presents for Retina Evaluation   HISTORY OF PRESENT ILLNESS: Kelsey Mendoza is a 69 y.o. female who presents to the clinic today for:   HPI    Retina Evaluation    In both eyes.  This started 2 years ago.  Duration of 2 years.  Context:  near vision, mid-range vision, distance vision and watching TV.  Treatments tried include no treatments.          Comments    ERM OU - Ref'd by Dr. Prudencio Burly  Pt c/o gradually decreasing VA OU x 2 years. Pt reports chronic floaters off and on OU. Pt reports brief flashes of light OU inferiorly x 2 months approx.        Last edited by [name], [place] on 11/10/2019  9:03 AM. (History)      Referring physician: Katy Apo, MD [place],  [place] [id number]  HISTORICAL INFORMATION:   Selected notes from the MEDICAL RECORD NUMBER       CURRENT MEDICATIONS: Current Outpatient Medications (Ophthalmic Drugs)  Medication Sig  . prednisoLONE acetate (PRED FORTE) 1 % ophthalmic suspension Place 1 drop into both eyes See admin instructions. Instill 1 drop into the right eye once daily for 4 days (should finish right eye on 6/8). Instill 1 drop into the left eye 3 times daily for 4 days, then twice daily for 7 days (starting on 6/9), then once daily for 7 days   No current facility-administered medications for this visit. (Ophthalmic Drugs)   Current Outpatient Medications (Other)  Medication Sig  . amLODipine (NORVASC) 5 MG tablet Take 1 tablet (5 mg total) by mouth daily.  Marland Kitchen amoxicillin (AMOXIL) 500 MG capsule Take 2,000 mg by mouth See admin instructions. Take 2000 mg 1 hour prior to dental work  . clobetasol (TEMOVATE) 0.05 % external solution Apply 1 application topically 2 (two) times daily as needed (psoriasis).   . diphenhydramine-acetaminophen (TYLENOL PM) 25-500 MG TABS tablet Take 0.5 tablets by mouth at bedtime as needed (sleep).  . furosemide (LASIX) 40 MG tablet Take 40 mg by  mouth daily as needed for fluid.   Marland Kitchen gabapentin (NEURONTIN) 100 MG capsule Take 100 mg by mouth at bedtime. (Patient not taking: Reported on 09/25/2019)  . ibuprofen (ADVIL) 800 MG tablet Take 1 tablet (800 mg total) by mouth every 8 (eight) hours as needed.  Marland Kitchen ketoconazole (NIZORAL) 2 % shampoo Apply 1 application topically daily as needed (psoriasis).   Marland Kitchen loratadine (CLARITIN) 10 MG tablet Take 10 mg by mouth daily as needed for allergies.  Marland Kitchen losartan (COZAAR) 100 MG tablet Take 100 mg by mouth every evening.   . rosuvastatin (CRESTOR) 5 MG tablet Take 5 mg by mouth at bedtime.   . triamterene-hydrochlorothiazide (MAXZIDE-25) 37.5-25 MG tablet Take 1 tablet by mouth daily.  . urea (CARMOL) 20 % cream Apply 1 application topically daily as needed (dry skin).    No current facility-administered medications for this visit. (Other)      REVIEW OF SYSTEMS:    ALLERGIES Allergies  Allergen Reactions  . Trazodone Other (See Comments)    Caused violent, disturbing nightmares     PAST MEDICAL HISTORY Past Medical History:  Diagnosis Date  . Cancer ([place]) 2000s   bladder  . COPD (chronic obstructive pulmonary disease) ([place])   . CPAP (continuous positive airway pressure) dependence   . HLD (hyperlipidemia)   . Hypertension   . Knee  pain   . Pre-diabetes   . Psoriasis of scalp   . Sleep apnea    Past Surgical History:  Procedure Laterality Date  . ABDOMINAL HYSTERECTOMY    . BACK SURGERY    . BLADDER SURGERY    . CARPAL TUNNEL RELEASE Right 06/06/2017   Procedure: CARPAL TUNNEL RELEASE;  Surgeon: [name], MD;  Location: [facility] ORS;  Service: Orthopedics;  Laterality: Right;  . I & D EXTREMITY Right 06/06/2017   Procedure: IRRIGATION AND DEBRIDEMENT EXTREMITY--RIGHT ARM;  Surgeon: [name], MD;  Location: [facility] ORS;  Service: Orthopedics;  Laterality: Right;  . KNEE ARTHROSCOPY Left 10/06/2019   Procedure: ARTHROSCOPY KNEE,PARTIAL MEDIAL MENISECTOMY,PARTIAL  CHONDROPLASTY;  Surgeon: [name], MD;  Location: [facility] ORS;  Service: Orthopedics;  Laterality: Left;  . KNEE SURGERY    . OPEN REDUCTION INTERNAL FIXATION (ORIF) DISTAL RADIAL FRACTURE N/A 06/06/2017   Procedure: OPEN REDUCTION INTERNAL FIXATION (ORIF) DISTAL RADIAL FRACTURE;  Surgeon: [name], MD;  Location: [facility] ORS;  Service: Orthopedics;  Laterality: N/A;    FAMILY HISTORY Family History  Problem Relation Age of Onset  . Hypertension Mother   . Hypertension Father   . CAD Father   . Breast cancer Neg Hx     SOCIAL HISTORY Social History   Tobacco Use  . Smoking status: Former Smoker    Quit date: 03/01/1983    Years since quitting: 36.7  . Smokeless tobacco: Never Used  Vaping Use  . Vaping Use: Never used  Substance Use Topics  . Alcohol use: No  . Drug use: No         OPHTHALMIC EXAM:  Base Eye Exam    Visual Acuity (ETDRS)      Right Left   Dist [place] 20/40 +2 20/70   Dist ph [place] NI NI       Tonometry (Tonopen, 9:07 AM)      Right Left   Pressure 16 14       Pupils      Pupils Dark Light Shape React APD   Right PERRL 4 3 Round Brisk None   Left PERRL 4 3 Round Brisk None       Visual Fields (Counting fingers)      Left Right    Full Full       Extraocular Movement      Right Left    Full Full       Neuro/Psych    Oriented x3: Yes   Mood/Affect: Normal       Dilation    Both eyes: 1.0% Mydriacyl, 2.5% Phenylephrine @ 9:08 AM        Slit Lamp and Fundus Exam    External Exam      Right Left   External Normal Normal       Slit Lamp Exam      Right Left   Lids/Lashes Normal Normal   Conjunctiva/Sclera White and quiet White and quiet   Cornea Clear Clear   Anterior Chamber Deep and quiet Deep and quiet   Iris Round and reactive Round and reactive   Anterior Vitreous Normal Normal       Fundus Exam      Right Left   Posterior Vitreous Posterior vitreous detachment    Disc Thin rim inferiorly    C/D Ratio 0.75  0.75   Macula Epiretinal membrane, moderate topo distortion  no exudates, no hemorrhage, Epiretinal membrane moderate topo distortions   Vessels Normal  Periphery Normal           IMAGING AND PROCEDURES  Imaging and Procedures for 11/10/19  OCT, Retina - OU - Both Eyes       Right Eye Quality was good. Scan locations included subfoveal. Central Foveal Thickness: 421. Progression has no prior data. Findings include epiretinal membrane.   Left Eye Quality was good. Scan locations included subfoveal. Central Foveal Thickness: 449. Progression has no prior data. Findings include epiretinal membrane.   Notes OS, with severe epiretinal membrane, macular thickening, intraretinal CME.  This while using a levo and topical Durezol.  Thus this is most likely CME secondary to the severe epiretinal membrane                ASSESSMENT/PLAN:  Diabetes mellitus without complication ([place]) The patient has diabetes without any evidence of retinopathy. The patient advised to maintain good blood glucose control, excellent blood pressure control, and favorable levels of cholesterol, low density lipoprotein, and high density lipoproteins. Follow up in 1 year was recommended. Explained that fluctuations in visual acuity , or "out of focus", may result from large variations of blood sugar control.  Left epiretinal membrane The nature of macular pucker (epiretinal membrane ERM) was discussed with the patient as well as threshold criteria for vitrectomy surgery. I explained that in rare cases another surgery is needed to actually remove a second wrinkle should it regrow.  Most often, the epiretinal membrane and underlying wrinkled internal limiting membrane are removed with the first surgery, to accomplish the goals.   If the operative eye is [place] (natural lens still present), cataract surgery is often recommended prior to Vitrectomy. This will enable the retina surgeon to have the best view during  surgery and the patient to obtain optimal results in the future. Treatment options were discussed.      ICD-10-CM   1. Left epiretinal membrane  H35.372 OCT, Retina - OU - Both Eyes  2. Right epiretinal membrane  H35.371 OCT, Retina - OU - Both Eyes  3. Cystoid macular edema of left eye  H35.352 OCT, Retina - OU - Both Eyes  4. Diabetes mellitus without complication ([place])  Z61.0   5. Glaucoma suspect, bilateral  H40.003     1.OS, with severe epiretinal membrane, macular thickening, intraretinal CME.  This while using a levo and topical Durezol.  Thus this is most likely CME secondary to the severe epiretinal membrane  2.  I discussed with the patient the need to undergo vitrectomy with membrane peel the left eye to afford the potential improvement by more normal restoration of foveal architecture, this would likely also prove the cystoid macular edema component of her poor vision.  3.  Risk and benefits reviewed.  Ophthalmic Meds Ordered this visit:  No orders of the defined types were placed in this encounter.      Return for Schedule vitrectomy, membrane peel, [place].  There are no Patient Instructions on file for this visit.   Explained the diagnoses, plan, and follow up with the patient and they expressed understanding.  Patient expressed understanding of the importance of proper follow up care.   Clent Demark Cole [place]ridge M.D. Diseases & Surgery of the Retina and Vitreous [facility]ina [facility] Diabetic [place] 11/10/19     Abbreviations: M myopia (nearsighted); A astigmatism; H hyperopia (farsighted); P presbyopia; Mrx spectacle prescription;  CTL contact lenses; OD right eye; OS left eye; OU both eyes  XT exotropia; ET esotropia; PEK punctate epithelial  keratitis; PEE punctate epithelial erosions; DES dry eye syndrome; MGD meibomian gland dysfunction; ATs artificial tears; PFAT's preservative free artificial tears; [place] nuclear sclerotic cataract; PSC posterior  subcapsular cataract; ERM epi-retinal membrane; PVD posterior vitreous detachment; RD retinal detachment; DM diabetes mellitus; DR diabetic retinopathy; NPDR non-proliferative diabetic retinopathy; PDR proliferative diabetic retinopathy; CSME clinically significant macular edema; DME diabetic macular edema; dbh dot blot hemorrhages; CWS cotton wool spot; POAG primary open angle glaucoma; C/D cup-to-disc ratio; HVF humphrey visual field; GVF goldmann visual field; OCT optical coherence tomography; IOP intraocular pressure; BRVO Branch retinal vein occlusion; CRVO central retinal vein occlusion; CRAO central retinal artery occlusion; BRAO branch retinal artery occlusion; RT retinal tear; SB scleral buckle; PPV pars plana vitrectomy; VH Vitreous hemorrhage; PRP panretinal laser photocoagulation; IVK intravitreal kenalog; VMT vitreomacular traction; MH Macular hole;  NVD neovascularization of the disc; NVE neovascularization elsewhere; AREDS age related eye disease study; ARMD age related macular degeneration; POAG primary open angle glaucoma; EBMD epithelial/anterior basement membrane dystrophy; ACIOL anterior chamber intraocular lens; IOL intraocular lens; PCIOL posterior chamber intraocular lens; Phaco/IOL phacoemulsification with intraocular lens placement; [place] photorefractive keratectomy; LASIK laser assisted in situ keratomileusis; HTN hypertension; DM diabetes mellitus; COPD chronic obstructive pulmonary disease

## 2019-11-10 NOTE — Assessment & Plan Note (Signed)

## 2019-11-10 NOTE — Assessment & Plan Note (Signed)

## 2019-11-19 ENCOUNTER — Ambulatory Visit (INDEPENDENT_AMBULATORY_CARE_PROVIDER_SITE_OTHER): Payer: Medicare Other

## 2019-11-19 ENCOUNTER — Other Ambulatory Visit: Payer: Self-pay

## 2019-11-19 ENCOUNTER — Encounter (INDEPENDENT_AMBULATORY_CARE_PROVIDER_SITE_OTHER): Payer: Self-pay

## 2019-11-19 DIAGNOSIS — H35372 Puckering of macula, left eye: Secondary | ICD-10-CM

## 2019-11-19 MED ORDER — PREDNISOLONE ACETATE 1 % OP SUSP
1.0000 [drp] | Freq: Four times a day (QID) | OPHTHALMIC | 0 refills | Status: DC
Start: 2019-11-19 — End: 2021-05-27

## 2019-11-19 MED ORDER — OFLOXACIN 0.3 % OP SOLN
1.0000 [drp] | Freq: Four times a day (QID) | OPHTHALMIC | 0 refills | Status: AC
Start: 2019-11-19 — End: 2019-12-10

## 2019-11-19 NOTE — Progress Notes (Signed)
11/19/2019     CHIEF COMPLAINT Patient presents for Pre-op Exam   HISTORY OF PRESENT ILLNESS: RHONDALYN CLINGAN is a 69 y.o. female who presents to the clinic today for:   HPI    Pre op OS for sx 11/26/19 PPV/MP OS   Last edited by [name], [place] on 11/19/2019 12:47 PM. (History)        HISTORICAL INFORMATION:   Selected notes from the MEDICAL RECORD NUMBER       CURRENT MEDICATIONS: Current Outpatient Medications (Ophthalmic Drugs)  Medication Sig  . prednisoLONE acetate (PRED FORTE) 1 % ophthalmic suspension Place 1 drop into both eyes See admin instructions. Instill 1 drop into the right eye once daily for 4 days (should finish right eye on 6/8). Instill 1 drop into the left eye 3 times daily for 4 days, then twice daily for 7 days (starting on 6/9), then once daily for 7 days   No current facility-administered medications for this visit. (Ophthalmic Drugs)   Current Outpatient Medications (Other)  Medication Sig  . amLODipine (NORVASC) 5 MG tablet Take 1 tablet (5 mg total) by mouth daily.  Marland Kitchen amoxicillin (AMOXIL) 500 MG capsule Take 2,000 mg by mouth See admin instructions. Take 2000 mg 1 hour prior to dental work  . clobetasol (TEMOVATE) 0.05 % external solution Apply 1 application topically 2 (two) times daily as needed (psoriasis).   . diphenhydramine-acetaminophen (TYLENOL PM) 25-500 MG TABS tablet Take 0.5 tablets by mouth at bedtime as needed (sleep).  . furosemide (LASIX) 40 MG tablet Take 40 mg by mouth daily as needed for fluid.   Marland Kitchen gabapentin (NEURONTIN) 100 MG capsule Take 100 mg by mouth at bedtime. (Patient not taking: Reported on 09/25/2019)  . ibuprofen (ADVIL) 800 MG tablet Take 1 tablet (800 mg total) by mouth every 8 (eight) hours as needed.  Marland Kitchen ketoconazole (NIZORAL) 2 % shampoo Apply 1 application topically daily as needed (psoriasis).   Marland Kitchen loratadine (CLARITIN) 10 MG tablet Take 10 mg by mouth daily as needed for allergies.  Marland Kitchen losartan (COZAAR)  100 MG tablet Take 100 mg by mouth every evening.   . rosuvastatin (CRESTOR) 5 MG tablet Take 5 mg by mouth at bedtime.   . triamterene-hydrochlorothiazide (MAXZIDE-25) 37.5-25 MG tablet Take 1 tablet by mouth daily.  . urea (CARMOL) 20 % cream Apply 1 application topically daily as needed (dry skin).    No current facility-administered medications for this visit. (Other)     ALLERGIES Allergies  Allergen Reactions  . Trazodone Other (See Comments)    Caused violent, disturbing nightmares     PAST MEDICAL HISTORY Past Medical History:  Diagnosis Date  . 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 COPD (chronic obstructive pulmonary disease) ([place])   . CPAP (continuous positive airway pressure) dependence   . HLD (hyperlipidemia)   . Hypertension   . Knee pain   . Pre-diabetes   . Psoriasis of scalp   . Sleep apnea    Past Surgical History:  Procedure Laterality Date  . ABDOMINAL HYSTERECTOMY    . BACK SURGERY    . BLADDER SURGERY    . CARPAL TUNNEL RELEASE Right 06/06/2017   Procedure: CARPAL TUNNEL RELEASE;  Surgeon: [name], MD;  Location: [facility] ORS;  Service: Orthopedics;  Laterality: Right;  . I & D EXTREMITY Right 06/06/2017   Procedure: IRRIGATION AND DEBRIDEMENT EXTREMITY--RIGHT ARM;  Surgeon: [name], MD;  Location: [facility] OR[facility];  Service: Orthopedics;  Laterality: Right;  .  KNEE ARTHROSCOPY Left 10/06/2019   Procedure: ARTHROSCOPY KNEE,PARTIAL MEDIAL MENISECTOMY,PARTIAL CHONDROPLASTY;  Surgeon: [name], MD;  Location: [facility] ORS;  Service: Orthopedics;  Laterality: Left;  . KNEE SURGERY    . OPEN REDUCTION INTERNAL FIXATION (ORIF) DISTAL RADIAL FRACTURE N/A 06/06/2017   Procedure: OPEN REDUCTION INTERNAL FIXATION (ORIF) DISTAL RADIAL FRACTURE;  Surgeon: [name], MD;  Location: [facility] ORS;  Service: Orthopedics;  Laterality: N/A;    FAMILY HISTORY Family History  Problem Relation Age of Onset  . Hypertension Mother   . Hypertension Father   . CAD  Father   . Breast cancer Neg Hx     SOCIAL HISTORY Social History   Tobacco Use  . Smoking status: Former Smoker    Quit date: 03/01/1983    Years since quitting: 36.7  . Smokeless tobacco: Never Used  Vaping Use  . Vaping Use: Never used  Substance Use Topics  . Alcohol use: No  . Drug use: No         OPHTHALMIC EXAM: Base Eye Exam    Visual Acuity    ETDRS          IMAGING AND PROCEDURES  Imaging and Procedures for @TODAY @           ASSESSMENT/PLAN:  No diagnosis found.  Ophthalmic Meds Ordered this visit:  No orders of the defined types were placed in this encounter.       Pre-op completed. Operative consent obtained with pre-op eye drops reviewed with Renaldo Fiddler and sent via [facility] as needed. Post op instructions reviewed with patient and per patient all questions answered.  [name], COA

## 2019-11-26 ENCOUNTER — Encounter (INDEPENDENT_AMBULATORY_CARE_PROVIDER_SITE_OTHER): Payer: Self-pay | Admitting: Ophthalmology

## 2019-11-26 ENCOUNTER — Encounter (AMBULATORY_SURGERY_CENTER): Payer: Medicare Other | Admitting: Ophthalmology

## 2019-11-26 DIAGNOSIS — H35372 Puckering of macula, left eye: Secondary | ICD-10-CM

## 2019-11-26 HISTORY — PX: EYE SURGERY: SHX253

## 2019-11-27 ENCOUNTER — Encounter (INDEPENDENT_AMBULATORY_CARE_PROVIDER_SITE_OTHER): Payer: Self-pay | Admitting: Ophthalmology

## 2019-11-27 ENCOUNTER — Ambulatory Visit (INDEPENDENT_AMBULATORY_CARE_PROVIDER_SITE_OTHER): Payer: Medicare Other | Admitting: Ophthalmology

## 2019-11-27 ENCOUNTER — Other Ambulatory Visit: Payer: Self-pay

## 2019-11-27 DIAGNOSIS — H35372 Puckering of macula, left eye: Secondary | ICD-10-CM

## 2019-11-27 NOTE — Assessment & Plan Note (Signed)
Postop day #1 status post vitrectomy for epiretinal membrane.  This left eye had a cortical vitreous strand and internal limiting membrane striae and each layer was peeled uneventfully uncomplicated fashion  Has sleep apnea and encouraged her to maintain CPAP use.  She reports compliance with this effort

## 2019-11-27 NOTE — Progress Notes (Signed)
11/27/2019     CHIEF COMPLAINT Patient presents for Post-op Follow-up   HISTORY OF PRESENT ILLNESS: Kelsey Mendoza is a 69 y.o. female who presents to the clinic today for:   HPI    Post-op Follow-up    In left eye.  Discomfort includes pain and foreign body sensation.  Vision is stable.  I, the attending physician,  performed the HPI with the patient and updated documentation appropriately.          Comments    1 Day s\p Vitrectomy and Membrane Peel OS for macular pucker  Pt had some discomfort last night. Pt c/o OS feeling gritty. Pt states OS was achy this morning and she took tylenol.       Last edited by Hurman Horn, MD on 11/27/2019 10:01 AM. (History)      Referring physician: [name], MD [place],  [place] [address]  HISTORICAL INFORMATION:   Selected notes from the MEDICAL RECORD NUMBER       CURRENT MEDICATIONS: Current Outpatient Medications (Ophthalmic Drugs)  Medication Sig  . ofloxacin (OCUFLOX) 0.3 % ophthalmic solution Place 1 drop into the left eye 4 (four) times daily for 21 days.  . prednisoLONE acetate (PRED FORTE) 1 % ophthalmic suspension Place 1 drop into both eyes See admin instructions. Instill 1 drop into the right eye once daily for 4 days (should finish right eye on 6/8). Instill 1 drop into the left eye 3 times daily for 4 days, then twice daily for 7 days (starting on 6/9), then once daily for 7 days  . prednisoLONE acetate (PRED FORTE) 1 % ophthalmic suspension Place 1 drop into the left eye 4 (four) times daily.   No current facility-administered medications for this visit. (Ophthalmic Drugs)   Current Outpatient Medications (Other)  Medication Sig  . amLODipine (NORVASC) 5 MG tablet Take 1 tablet (5 mg total) by mouth daily.  Marland Kitchen amoxicillin (AMOXIL) 500 MG capsule Take 2,000 mg by mouth See admin instructions. Take 2000 mg 1 hour prior to dental work  . clobetasol (TEMOVATE) 0.05 % external solution Apply 1  application topically 2 (two) times daily as needed (psoriasis).   . diphenhydramine-acetaminophen (TYLENOL PM) 25-500 MG TABS tablet Take 0.5 tablets by mouth at bedtime as needed (sleep).  . furosemide (LASIX) 40 MG tablet Take 40 mg by mouth daily as needed for fluid.   Marland Kitchen gabapentin (NEURONTIN) 100 MG capsule Take 100 mg by mouth at bedtime. (Patient not taking: Reported on 09/25/2019)  . ibuprofen (ADVIL) 800 MG tablet Take 1 tablet (800 mg total) by mouth every 8 (eight) hours as needed.  Marland Kitchen ketoconazole (NIZORAL) 2 % shampoo Apply 1 application topically daily as needed (psoriasis).   Marland Kitchen loratadine (CLARITIN) 10 MG tablet Take 10 mg by mouth daily as needed for allergies.  Marland Kitchen losartan (COZAAR) 100 MG tablet Take 100 mg by mouth every evening.   . rosuvastatin (CRESTOR) 5 MG tablet Take 5 mg by mouth at bedtime.   . triamterene-hydrochlorothiazide (MAXZIDE-25) 37.5-25 MG tablet Take 1 tablet by mouth daily.  . urea (CARMOL) 20 % cream Apply 1 application topically daily as needed (dry skin).    No current facility-administered medications for this visit. (Other)      REVIEW OF SYSTEMS:    ALLERGIES Allergies  Allergen Reactions  . Trazodone Other (See Comments)    Caused violent, disturbing nightmares     PAST MEDICAL HISTORY Past Medical History:  Diagnosis  Date  . Cancer ([place]) 2000s   bladder  . COPD (chronic obstructive pulmonary disease) ([place])   . CPAP (continuous positive airway pressure) dependence   . HLD (hyperlipidemia)   . Hypertension   . Knee pain   . Pre-diabetes   . Psoriasis of scalp   . Sleep apnea    Past Surgical History:  Procedure Laterality Date  . ABDOMINAL HYSTERECTOMY    . BACK SURGERY    . BLADDER SURGERY    . CARPAL TUNNEL RELEASE Right 06/06/2017   Procedure: CARPAL TUNNEL RELEASE;  Surgeon: [name], MD;  Location: [facility] ORS;  Service: Orthopedics;  Laterality: Right;  . EYE SURGERY Left 11/26/2019   Dr. [name], Vit, Mem Peel  . I  & D EXTREMITY Right 06/06/2017   Procedure: IRRIGATION AND DEBRIDEMENT EXTREMITY--RIGHT ARM;  Surgeon: [name], MD;  Location: [facility] ORS;  Service: Orthopedics;  Laterality: Right;  . KNEE ARTHROSCOPY Left 10/06/2019   Procedure: ARTHROSCOPY KNEE,PARTIAL MEDIAL MENISECTOMY,PARTIAL CHONDROPLASTY;  Surgeon: [name], MD;  Location: [facility] ORS;  Service: Orthopedics;  Laterality: Left;  . KNEE SURGERY    . OPEN REDUCTION INTERNAL FIXATION (ORIF) DISTAL RADIAL FRACTURE N/A 06/06/2017   Procedure: OPEN REDUCTION INTERNAL FIXATION (ORIF) DISTAL RADIAL FRACTURE;  Surgeon: [name], MD;  Location: [facility] OR[facility];  Service: Orthopedics;  Laterality: N/A;    FAMILY HISTORY Family History  Problem Relation Age of Onset  . Hypertension Mother   . Hypertension Father   . CAD Father   . Breast cancer Neg Hx     SOCIAL HISTORY Social History   Tobacco Use  . Smoking status: Former Smoker    Quit date: 03/01/1983    Years since quitting: 36.7  . Smokeless tobacco: Never Used  Vaping Use  . Vaping Use: Never used  Substance Use Topics  . Alcohol use: No  . Drug use: No         OPHTHALMIC EXAM:  Base Eye Exam    Visual Acuity (Snellen - Linear)      Right Left   Dist [place] 20/40 -1 20/200   Dist ph [place]  20/60       Tonometry (Tonopen, 9:03 AM)      Right Left   Pressure 14 13       Pupils      Dark Light Shape React   Right       Left 7 7 Round Dilated       Neuro/Psych    Oriented x3: Yes   Mood/Affect: Normal        Slit Lamp and Fundus Exam    External Exam      Right Left   External Normal Normal       Slit Lamp Exam      Right Left   Lids/Lashes Normal Normal   Conjunctiva/Sclera White and quiet White and quiet   Cornea Clear Clear   Anterior Chamber Deep and quiet Deep and quiet   Iris Round and reactive Round and reactive   Lens  Centered posterior chamber intraocular lens   Anterior Vitreous Normal Normal       Fundus Exam      Right Left     Posterior Vitreous  Clear, vitrectomized   Disc  Normal   C/D Ratio  0.75   Macula   no exudates, no hemorrhage, Epiretinal membrane moderate topo distortions   Vessels  Normal   Periphery  Normal  IMAGING AND PROCEDURES  Imaging and Procedures for 11/27/19           ASSESSMENT/PLAN:  Left epiretinal membrane Postop day #1 status post vitrectomy for epiretinal membrane.  This left eye had a cortical vitreous strand and internal limiting membrane striae and each layer was peeled uneventfully uncomplicated fashion  Has sleep apnea and encouraged her to maintain CPAP use.  She reports compliance with this effort      ICD-10-CM   1. Left epiretinal membrane  H35.372     1.  Patient is to restart topical medications, Ocuflox 1 drop OS 4 times daily as well as prednisolone acetate 1 drop left eye 4 times daily 2.  3.  Ophthalmic Meds Ordered this visit:  No orders of the defined types were placed in this encounter.      Return for dilate, OS, OCT.  There are no Patient Instructions on file for this visit.   Explained the diagnoses, plan, and follow up with the patient and they expressed understanding.  Patient expressed understanding of the importance of proper follow up care.   Clent Demark Othel Dicostanzo M.D. Diseases & Surgery of the Retina and Vitreous Retina & Diabetic [place] 11/27/19     Abbreviations: M myopia (nearsighted); A astigmatism; H hyperopia (farsighted); P presbyopia; Mrx spectacle prescription;  CTL contact lenses; OD right eye; OS left eye; OU both eyes  XT exotropia; ET esotropia; PEK punctate epithelial keratitis; PEE punctate epithelial erosions; DES dry eye syndrome; MGD meibomian gland dysfunction; ATs artificial tears; PFAT's preservative free artificial tears; [place] nuclear sclerotic cataract; PSC posterior subcapsular cataract; ERM epi-retinal membrane; PVD posterior vitreous detachment; RD retinal detachment; DM diabetes mellitus; DR  diabetic retinopathy; NPDR non-proliferative diabetic retinopathy; PDR proliferative diabetic retinopathy; CSME clinically significant macular edema; DME diabetic macular edema; dbh dot blot hemorrhages; CWS cotton wool spot; POAG primary open angle glaucoma; C/D cup-to-disc ratio; HVF humphrey visual field; GVF goldmann visual field; OCT optical coherence tomography; IOP intraocular pressure; BRVO Branch retinal vein occlusion; CRVO central retinal vein occlusion; CRAO central retinal artery occlusion; BRAO branch retinal artery occlusion; RT retinal tear; SB scleral buckle; PPV pars plana vitrectomy; VH Vitreous hemorrhage; PRP panretinal laser photocoagulation; IVK intravitreal kenalog; VMT vitreomacular traction; MH Macular hole;  NVD neovascularization of the disc; NVE neovascularization elsewhere; AREDS age related eye disease study; ARMD age related macular degeneration; POAG primary open angle glaucoma; EBMD epithelial/anterior basement membrane dystrophy; ACIOL anterior chamber intraocular lens; IOL intraocular lens; PCIOL posterior chamber intraocular lens; Phaco/IOL phacoemulsification with intraocular lens placement; [place] photorefractive keratectomy; LASIK laser assisted in situ keratomileusis; HTN hypertension; DM diabetes mellitus; COPD chronic obstructive pulmonary disease

## 2019-12-04 ENCOUNTER — Encounter (INDEPENDENT_AMBULATORY_CARE_PROVIDER_SITE_OTHER): Payer: Self-pay | Admitting: Ophthalmology

## 2019-12-04 ENCOUNTER — Ambulatory Visit (INDEPENDENT_AMBULATORY_CARE_PROVIDER_SITE_OTHER): Payer: Medicare Other | Admitting: Ophthalmology

## 2019-12-04 ENCOUNTER — Other Ambulatory Visit: Payer: Self-pay

## 2019-12-04 DIAGNOSIS — H35371 Puckering of macula, right eye: Secondary | ICD-10-CM

## 2019-12-04 DIAGNOSIS — H35372 Puckering of macula, left eye: Secondary | ICD-10-CM | POA: Diagnosis not present

## 2019-12-04 NOTE — Progress Notes (Signed)
12/04/2019     CHIEF COMPLAINT Patient presents for Post-op Follow-up   HISTORY OF PRESENT ILLNESS: Kelsey Mendoza is a 69 y.o. female who presents to the clinic today for:   HPI    Post-op Follow-up    In left eye.  Vision is stable.          Comments    1 Week POV OS  Pt reports "marginal" improvement OS, but still blurry. Pt denies any other symptoms OS. Pt denies changes OD.       Last edited by Kelsey Mendoza, [place] on 12/04/2019  8:30 AM. (History)      Referring physician: [name], MD [place],   [id number]  HISTORICAL INFORMATION:   Selected notes from the MEDICAL RECORD NUMBER       CURRENT MEDICATIONS: Current Outpatient Medications (Ophthalmic Drugs)  Medication Sig  . ofloxacin (OCUFLOX) 0.3 % ophthalmic solution Place 1 drop into the left eye 4 (four) times daily for 21 days.  . prednisoLONE acetate (PRED FORTE) 1 % ophthalmic suspension Place 1 drop into both eyes See admin instructions. Instill 1 drop into the right eye once daily for 4 days (should finish right eye on 6/8). Instill 1 drop into the left eye 3 times daily for 4 days, then twice daily for 7 days (starting on 6/9), then once daily for 7 days  . prednisoLONE acetate (PRED FORTE) 1 % ophthalmic suspension Place 1 drop into the left eye 4 (four) times daily.   No current facility-administered medications for this visit. (Ophthalmic Drugs)   Current Outpatient Medications (Other)  Medication Sig  . amLODipine (NORVASC) 5 MG tablet Take 1 tablet (5 mg total) by mouth daily.  Marland Kitchen amoxicillin (AMOXIL) 500 MG capsule Take 2,000 mg by mouth See admin instructions. Take 2000 mg 1 hour prior to dental work  . clobetasol (TEMOVATE) 0.05 % external solution Apply 1 application topically 2 (two) times daily as needed (psoriasis).   . diphenhydramine-acetaminophen (TYLENOL PM) 25-500 MG TABS tablet Take 0.5 tablets by mouth at bedtime as needed (sleep).  . furosemide (LASIX)  40 MG tablet Take 40 mg by mouth daily as needed for fluid.   Marland Kitchen gabapentin (NEURONTIN) 100 MG capsule Take 100 mg by mouth at bedtime. (Patient not taking: Reported on 09/25/2019)  . ibuprofen (ADVIL) 800 MG tablet Take 1 tablet (800 mg total) by mouth every 8 (eight) hours as needed.  Marland Kitchen ketoconazole (NIZORAL) 2 % shampoo Apply 1 application topically daily as needed (psoriasis).   Marland Kitchen loratadine (CLARITIN) 10 MG tablet Take 10 mg by mouth daily as needed for allergies.  Marland Kitchen losartan (COZAAR) 100 MG tablet Take 100 mg by mouth every evening.   . rosuvastatin (CRESTOR) 5 MG tablet Take 5 mg by mouth at bedtime.   . triamterene-hydrochlorothiazide (MAXZIDE-25) 37.5-25 MG tablet Take 1 tablet by mouth daily.  . urea (CARMOL) 20 % cream Apply 1 application topically daily as needed (dry skin).    No current facility-administered medications for this visit. (Other)      REVIEW OF SYSTEMS:    ALLERGIES Allergies  Allergen Reactions  . Trazodone Other (See Comments)    Caused violent, disturbing nightmares     PAST MEDICAL HISTORY Past Medical History:  Diagnosis Date  . 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 COPD (chronic obstructive pulmonary disease) ([place])   . CPAP (continuous positive airway pressure) dependence   . HLD (hyperlipidemia)   . Hypertension   .  Knee pain   . Pre-diabetes   . Psoriasis of scalp   . Sleep apnea    Past Surgical History:  Procedure Laterality Date  . ABDOMINAL HYSTERECTOMY    . BACK SURGERY    . BLADDER SURGERY    . CARPAL TUNNEL RELEASE Right 06/06/2017   Procedure: CARPAL TUNNEL RELEASE;  Surgeon: Kelsey Park, MD;  Location: [facility];  Service: Orthopedics;  Laterality: Right;  . EYE SURGERY Left 11/26/2019   Dr. Zadie Mendoza, Vit, Mem Peel  . I & D EXTREMITY Right 06/06/2017   Procedure: IRRIGATION AND DEBRIDEMENT EXTREMITY--RIGHT ARM;  Surgeon: Kelsey Park, MD;  Location: [facility];  Service: Orthopedics;  Laterality: Right;  . KNEE ARTHROSCOPY  Left 10/06/2019   Procedure: ARTHROSCOPY KNEE,PARTIAL MEDIAL MENISECTOMY,PARTIAL CHONDROPLASTY;  Surgeon: Kelsey Leep, MD;  Location: [facility];  Service: Orthopedics;  Laterality: Left;  . KNEE SURGERY    . OPEN REDUCTION INTERNAL FIXATION (ORIF) DISTAL RADIAL FRACTURE N/A 06/06/2017   Procedure: OPEN REDUCTION INTERNAL FIXATION (ORIF) DISTAL RADIAL FRACTURE;  Surgeon: Kelsey Park, MD;  Location: [facility];  Service: Orthopedics;  Laterality: N/A;    FAMILY HISTORY Family History  Problem Relation Age of Onset  . Hypertension Mother   . Hypertension Father   . CAD Father   . Breast cancer Neg Hx     SOCIAL HISTORY Social History   Tobacco Use  . Smoking status: Former Smoker    Quit date: 03/01/1983    Years since quitting: 36.7  . Smokeless tobacco: Never Used  Vaping Use  . Vaping Use: Never used  Substance Use Topics  . Alcohol use: No  . Drug use: No         OPHTHALMIC EXAM: Base Eye Exam    Visual Acuity (ETDRS)      Right Left   Dist [place] 20/40 -2 20/70 -2   Dist ph [place] 20/60 -2       Tonometry (Tonopen, 8:31 AM)      Right Left   Pressure 15 10       Pupils      Pupils Dark Light Shape React APD   Right PERRL 4 3 Round Brisk None   Left PERRL 4 3 Round Brisk None       Visual Fields (Counting fingers)      Left Right    Full Full       Extraocular Movement      Right Left    Full Full       Neuro/Psych    Oriented x3: Yes   Mood/Affect: Normal       Dilation    Left eye: 1.0% Mydriacyl, 2.5% Phenylephrine @ 8:33 AM        Slit Lamp and Fundus Exam    External Exam      Right Left   External Normal Normal       Slit Lamp Exam      Right Left   Lids/Lashes Normal Normal   Conjunctiva/Sclera White and quiet White and quiet   Cornea Clear Clear   Anterior Chamber Deep and quiet Deep and quiet   Iris Round and reactive Round and reactive   Lens  Centered posterior chamber intraocular lens   Anterior Vitreous Normal Normal         Fundus Exam      Right Left   Posterior Vitreous  Clear, vitrectomized   Disc  Normal   C/D Ratio  0.75   Macula  no exudates, no hemorrhage, no topographic distortions   Vessels  Normal   Periphery  Normal          IMAGING AND PROCEDURES  Imaging and Procedures for 12/04/19  OCT, Retina - OU - Both Eyes       Right Eye Quality was good. Scan locations included subfoveal. Central Foveal Thickness: 415. Progression has been stable. Findings include abnormal foveal contour, epiretinal membrane.   Left Eye Central Foveal Thickness: 430. Progression has improved. Findings include abnormal foveal contour.   Notes 1 week status post vitrectomy membrane peel left eye, with much less retinal thickening                ASSESSMENT/PLAN:  No problem-specific Assessment & Plan notes found for this encounter.      ICD-10-CM   1. Left epiretinal membrane  H35.372 OCT, Retina - OU - Both Eyes    1.  Patient will complete her topical medications, she is also using her ongoing CPAP for known obstructive sleep apnea, combined mechanism central sleep apnea  2.  Visual acuity left eye has already improved compared to baseline 3.  Ophthalmic Meds Ordered this visit:  No orders of the defined types were placed in this encounter.      Return in about 10 weeks (around 02/12/2020) for DILATE OU, OCT.  Patient Instructions  No lifting and bending for 1 week. No water in the eye for 10 days. Do not rub the eye. Wear shield at night for 1-3 days.  Wear your CPAP as normal, if instructed by your doctor.  Continue your topical medications for a total of 3 weeks.  Do not refill your postoperative medications unless instructed.    Explained the diagnoses, plan, and follow up with the patient and they expressed understanding.  Patient expressed understanding of the importance of proper follow up care.   Clent Demark Tiera Mensinger M.D. Diseases & Surgery of the Retina and  Vitreous Retina & Diabetic [place] 12/04/19     Abbreviations: M myopia (nearsighted); A astigmatism; H hyperopia (farsighted); P presbyopia; Mrx spectacle prescription;  CTL contact lenses; OD right eye; OS left eye; OU both eyes  XT exotropia; ET esotropia; PEK punctate epithelial keratitis; PEE punctate epithelial erosions; DES dry eye syndrome; MGD meibomian gland dysfunction; ATs artificial tears; PFAT's preservative free artificial tears; [place] nuclear sclerotic cataract; PSC posterior subcapsular cataract; ERM epi-retinal membrane; PVD posterior vitreous detachment; RD retinal detachment; DM diabetes mellitus; DR diabetic retinopathy; NPDR non-proliferative diabetic retinopathy; PDR proliferative diabetic retinopathy; CSME clinically significant macular edema; DME diabetic macular edema; dbh dot blot hemorrhages; CWS cotton wool spot; POAG primary open angle glaucoma; C/D cup-to-disc ratio; HVF humphrey visual field; GVF goldmann visual field; OCT optical coherence tomography; IOP intraocular pressure; BRVO Branch retinal vein occlusion; CRVO central retinal vein occlusion; CRAO central retinal artery occlusion; BRAO branch retinal artery occlusion; RT retinal tear; SB scleral buckle; PPV pars plana vitrectomy; VH Vitreous hemorrhage; PRP panretinal laser photocoagulation; IVK intravitreal kenalog; VMT vitreomacular traction; MH Macular hole;  NVD neovascularization of the disc; NVE neovascularization elsewhere; AREDS age related eye disease study; ARMD age related macular degeneration; POAG primary open angle glaucoma; EBMD epithelial/anterior basement membrane dystrophy; ACIOL anterior chamber intraocular lens; IOL intraocular lens; PCIOL posterior chamber intraocular lens; Phaco/IOL phacoemulsification with intraocular lens placement; [place] photorefractive keratectomy; LASIK laser assisted in situ keratomileusis; HTN hypertension; DM diabetes mellitus; COPD chronic obstructive pulmonary disease

## 2019-12-04 NOTE — Assessment & Plan Note (Signed)
We will continue to monitor the right eye and likely need be undertaken a vitrectomy membrane peel once the acuity of the left eye improves further

## 2019-12-04 NOTE — Patient Instructions (Addendum)
No lifting and bending for 1 week. No water in the eye for 10 days. Do not rub the eye. Wear shield at night for 1-3 days.  Wear your CPAP as normal, if instructed by your doctor.  Continue your topical medications for a total of 3 weeks.  Do not refill your postoperative medications unless instructed. Patient to complete her topical medications left eye over the next 2 weeks or sooner if the medications are completed refill of medications

## 2020-02-12 ENCOUNTER — Ambulatory Visit (INDEPENDENT_AMBULATORY_CARE_PROVIDER_SITE_OTHER): Payer: Medicare Other | Admitting: Ophthalmology

## 2020-02-12 ENCOUNTER — Other Ambulatory Visit: Payer: Self-pay

## 2020-02-12 ENCOUNTER — Encounter (INDEPENDENT_AMBULATORY_CARE_PROVIDER_SITE_OTHER): Payer: Self-pay | Admitting: Ophthalmology

## 2020-02-12 DIAGNOSIS — H35352 Cystoid macular degeneration, left eye: Secondary | ICD-10-CM

## 2020-02-12 DIAGNOSIS — H35371 Puckering of macula, right eye: Secondary | ICD-10-CM

## 2020-02-12 DIAGNOSIS — H26492 Other secondary cataract, left eye: Secondary | ICD-10-CM | POA: Insufficient documentation

## 2020-02-12 DIAGNOSIS — H35372 Puckering of macula, left eye: Secondary | ICD-10-CM

## 2020-02-12 NOTE — Addendum Note (Signed)
Addended by: Deloria Lair A on: 02/12/2020 09:47 AM   Modules accepted: Level of Service

## 2020-02-12 NOTE — Progress Notes (Addendum)
02/12/2020     CHIEF COMPLAINT Patient presents for Retina Follow Up   HISTORY OF PRESENT ILLNESS: Kelsey Mendoza is a 69 y.o. female who presents to the clinic today for:   HPI    Retina Follow Up    Patient presents with  Other.  In both eyes.  This started 10 weeks ago.  Severity is mild.  Duration of 10 weeks.  Since onset it is stable.          Comments    10 Week F/U OU  Status post vitrectomy membrane peel left eye 11-26-19, with continued acuity improvement per patient  Pt denies noticeable changes to [place] since last visit. Pt denies ocular pain, flashes of light, or floaters OU.         Last edited by Hurman Horn, MD on 02/12/2020  9:35 AM. (History)      Referring physician: [name], MD [address] [place],  [place] [id number]  HISTORICAL INFORMATION:   Selected notes from the MEDICAL RECORD NUMBER       CURRENT MEDICATIONS: Current Outpatient Medications (Ophthalmic Drugs)  Medication Sig  . prednisoLONE acetate (PRED FORTE) 1 % ophthalmic suspension Place 1 drop into both eyes See admin instructions. Instill 1 drop into the right eye once daily for 4 days (should finish right eye on 6/8). Instill 1 drop into the left eye 3 times daily for 4 days, then twice daily for 7 days (starting on 6/9), then once daily for 7 days (Patient not taking: Reported on 02/12/2020)  . prednisoLONE acetate (PRED FORTE) 1 % ophthalmic suspension Place 1 drop into the left eye 4 (four) times daily. (Patient not taking: Reported on 02/12/2020)   No current facility-administered medications for this visit. (Ophthalmic Drugs)   Current Outpatient Medications (Other)  Medication Sig  . amLODipine (NORVASC) 5 MG tablet Take 1 tablet (5 mg total) by mouth daily.  Marland Kitchen amoxicillin (AMOXIL) 500 MG capsule Take 2,000 mg by mouth See admin instructions. Take 2000 mg 1 hour prior to dental work  . clobetasol (TEMOVATE) 0.05 % external solution Apply 1 application topically 2  (two) times daily as needed (psoriasis).   . diphenhydramine-acetaminophen (TYLENOL PM) 25-500 MG TABS tablet Take 0.5 tablets by mouth at bedtime as needed (sleep).  . furosemide (LASIX) 40 MG tablet Take 40 mg by mouth daily as needed for fluid.   Marland Kitchen gabapentin (NEURONTIN) 100 MG capsule Take 100 mg by mouth at bedtime. (Patient not taking: Reported on 09/25/2019)  . ibuprofen (ADVIL) 800 MG tablet Take 1 tablet (800 mg total) by mouth every 8 (eight) hours as needed.  Marland Kitchen ketoconazole (NIZORAL) 2 % shampoo Apply 1 application topically daily as needed (psoriasis).   Marland Kitchen loratadine (CLARITIN) 10 MG tablet Take 10 mg by mouth daily as needed for allergies.  Marland Kitchen losartan (COZAAR) 100 MG tablet Take 100 mg by mouth every evening.   . rosuvastatin (CRESTOR) 5 MG tablet Take 5 mg by mouth at bedtime.   . triamterene-hydrochlorothiazide (MAXZIDE-25) 37.5-25 MG tablet Take 1 tablet by mouth daily.  . urea (CARMOL) 20 % cream Apply 1 application topically daily as needed (dry skin).    No current facility-administered medications for this visit. (Other)      REVIEW OF SYSTEMS:    ALLERGIES Allergies  Allergen Reactions  . Trazodone Other (See Comments)    Caused violent, disturbing nightmares     PAST MEDICAL HISTORY Past Medical History:  Diagnosis  Date  . Cancer ([place]) 2000s   bladder  . COPD (chronic obstructive pulmonary disease) ([place])   . CPAP (continuous positive airway pressure) dependence   . HLD (hyperlipidemia)   . Hypertension   . Knee pain   . Pre-diabetes   . Psoriasis of scalp   . Sleep apnea    Past Surgical History:  Procedure Laterality Date  . ABDOMINAL HYSTERECTOMY    . BACK SURGERY    . BLADDER SURGERY    . CARPAL TUNNEL RELEASE Right 06/06/2017   Procedure: CARPAL TUNNEL RELEASE;  Surgeon: [name], MD;  Location: [facility] ORS;  Service: Orthopedics;  Laterality: Right;  . EYE SURGERY Left 11/26/2019   Dr. [name], Vit, Mem Peel  . I & D EXTREMITY Right  06/06/2017   Procedure: IRRIGATION AND DEBRIDEMENT EXTREMITY--RIGHT ARM;  Surgeon: [name], MD;  Location: [facility] ORS;  Service: Orthopedics;  Laterality: Right;  . KNEE ARTHROSCOPY Left 10/06/2019   Procedure: ARTHROSCOPY KNEE,PARTIAL MEDIAL MENISECTOMY,PARTIAL CHONDROPLASTY;  Surgeon: [name], MD;  Location: [facility] ORS;  Service: Orthopedics;  Laterality: Left;  . KNEE SURGERY    . OPEN REDUCTION INTERNAL FIXATION (ORIF) DISTAL RADIAL FRACTURE N/A 06/06/2017   Procedure: OPEN REDUCTION INTERNAL FIXATION (ORIF) DISTAL RADIAL FRACTURE;  Surgeon: [name], MD;  Location: [facility] ORS;  Service: Orthopedics;  Laterality: N/A;    FAMILY HISTORY Family History  Problem Relation Age of Onset  . Hypertension Mother   . Hypertension Father   . CAD Father   . Breast cancer Neg Hx     SOCIAL HISTORY Social History   Tobacco Use  . Smoking status: Former Smoker    Quit date: 03/01/1983    Years since quitting: 36.9  . Smokeless tobacco: Never Used  Vaping Use  . Vaping Use: Never used  Substance Use Topics  . Alcohol use: No  . Drug use: No         OPHTHALMIC EXAM:  Base Eye Exam    Visual Acuity (ETDRS)      Right Left   Dist [place] 20/50 20/100 -3   Dist ph [place] 20/30 20/50 -1   Correction: Glasses       Tonometry (Tonopen, 8:39 AM)      Right Left   Pressure 18 19       Pupils      Pupils Dark Light Shape React APD   Right PERRL 4 3 Round Brisk None   Left PERRL 4 3 Round Brisk None       Visual Fields (Counting fingers)      Left Right    Full Full       Extraocular Movement      Right Left    Full Full       Neuro/Psych    Oriented x3: Yes   Mood/Affect: Normal       Dilation    Both eyes: 1.0% Mydriacyl, 2.5% Phenylephrine @ 8:42 AM        Slit Lamp and Fundus Exam    External Exam      Right Left   External Normal Normal       Slit Lamp Exam      Right Left   Lids/Lashes Normal Normal   Conjunctiva/Sclera White and quiet White  and quiet   Cornea Clear Clear   Anterior Chamber Deep and quiet Deep and quiet   Iris Round and reactive Round and reactive   Lens  Centered posterior chamber intraocular lens, 2+  Posterior capsular opacification centrally   Anterior Vitreous Normal Normal       Fundus Exam      Right Left   Posterior Vitreous Normal Clear, vitrectomized   Disc Normal Normal   C/D Ratio 0.65 0.75   Macula Epiretinal membrane, moderate topographic distortion  no exudates, no hemorrhage, no topographic distortions   Vessels Normal Normal   Periphery Normal Normal          IMAGING AND PROCEDURES  Imaging and Procedures for 02/12/20  OCT, Retina - OU - Both Eyes       Right Eye Quality was good. Scan locations included subfoveal. Central Foveal Thickness: 421. Progression has worsened. Findings include epiretinal membrane, vitreous traction.   Left Eye Quality was good. Scan locations included subfoveal. Central Foveal Thickness: 409. Progression has improved. Findings include abnormal foveal contour.   Notes OD with severe epiretinal membrane, severe topographic distortion and elevation of the foveal region with vision loss  OS, nearly 3 months status post vitrectomy membrane peel with improved contour with minor inner foveal perifoveal CME, this could be residual of MAC-TEL, as the patient has diagnosed sleep apnea and it but is using compliantly her CPAP mask                ASSESSMENT/PLAN:  Cystoid macular edema of left eye Perifoveal, minor, stable on CPAP which control this condition because of her diagnosed and continued compliance with treatment of sleep apnea  Left epiretinal membrane Vastly improved OS status post vitrectomy membrane peel  Right epiretinal membrane Visual acuity impact OD secondary to epiretinal membrane.  Patient wished to proceed with vitrectomy and membrane peel right eye  Left posterior capsular opacification Center involved posterior capsule  opacity, patient should be prepared to see Dr. Prudencio Burly in the near future for consideration of YAG capsulotomy to enhance and maximize visual acuity      ICD-10-CM   1. Right epiretinal membrane  H35.371 OCT, Retina - OU - Both Eyes  2. Cystoid macular edema of left eye  H35.352 OCT, Retina - OU - Both Eyes  3. Left posterior capsular opacification  H26.492   4. Left epiretinal membrane  H35.372 OCT, Retina - OU - Both Eyes    1.  OS looks great with improved visual acuity now nearly 3 months post vitrectomy membrane peel.  2.  Visual acuity decline right eye, patient requires and wishes to proceed with vitrectomy membrane peel right eye soon  3.  Patient will check her schedule and call to contact the office to schedule preoperative visit in a separate day for surgical intervention right eye  Ophthalmic Meds Ordered this visit:  No orders of the defined types were placed in this encounter.      Return , [facility], for We will schedule preop, OD, vitrectomy membrane peel, LMAC.  Patient Instructions  The patient understands the risks of anesthesia including but not limited to the rare occurrence of death. The patient also understands risks and benefits of the planned surgical procedure include but are not limited to hemorrhage, infection, scarring, loss of vision, progression of disease despite intervention, and need for another procedure.  1.  GIVE PREOPERATIVE EYE MEDICATION TO PATIENT TO TAKE HOME, AND DO NOT USE UNTIL POSTOPERATIVE VISIT THE NEXT DAY IN [facility] OFFICE UNLESS INSTRUCTED BY PHYSICIAN TO START THE NEXT DAY.   POSITIONS:  (if gas bubble placed into the eye)  A.  Do not sleep or rest on back  B.  Do not travel by airplane, to the mountains, or elevation above 2000 feet  C.       Explained the diagnoses, plan, and follow up with the patient and they expressed understanding.  Patient expressed understanding of the importance of proper follow up care.    Clent Demark Wilber Fini M.D. Diseases & Surgery of the Retina and Vitreous [facility]ina [facility] Di[facility] [place] 02/12/20     Abbreviations: M myopia (nearsighted); A astigmatism; H hyperopia (farsighted); P presbyopia; Mrx spectacle prescription;  CTL contact lenses; OD right eye; OS left eye; OU both eyes  XT exotropia; ET esotropia; PEK punctate epithelial keratitis; PEE punctate epithelial erosions; DES dry eye syndrome; MGD meibomian gland dysfunction; ATs artificial tears; PFAT's preservative free artificial tears; [place] nuclear sclerotic cataract; PSC posterior subcapsular cataract; ERM epi-retinal membrane; PVD posterior vitreous detachment; RD retinal detachment; DM diabetes mellitus; DR diabetic retinopathy; NPDR non-proliferative diabetic retinopathy; PDR proliferative diabetic retinopathy; CSME clinically significant macular edema; DME diabetic macular edema; dbh dot blot hemorrhages; CWS cotton wool spot; POAG primary open angle glaucoma; C/D cup-to-disc ratio; HVF humphrey visual field; GVF goldmann visual field; OCT optical coherence tomography; IOP intraocular pressure; BRVO Branch retinal vein occlusion; CRVO central retinal vein occlusion; CRAO central retinal artery occlusion; BRAO branch retinal artery occlusion; RT retinal tear; SB scleral buckle; PPV pars plana vitrectomy; VH Vitreous hemorrhage; PRP panretinal laser photocoagulation; IVK intravitreal kenalog; VMT vitreomacular traction; MH Macular hole;  NVD neovascularization of the disc; NVE neovascularization elsewhere; AREDS age related eye disease study; ARMD age related macular degeneration; POAG primary open angle glaucoma; EBMD epithelial/anterior basement membrane dystrophy; ACIOL anterior chamber intraocular lens; IOL intraocular lens; PCIOL posterior chamber intraocular lens; Phaco/IOL phacoemulsification with intraocular lens placement; [place] photorefractive keratectomy; LASIK laser assisted in situ keratomileusis; HTN hypertension; DM  diabetes mellitus; COPD chronic obstructive pulmonary disease

## 2020-02-12 NOTE — Assessment & Plan Note (Signed)
Perifoveal, minor, stable on CPAP which control this condition because of her diagnosed and continued compliance with treatment of sleep apnea

## 2020-02-12 NOTE — Assessment & Plan Note (Signed)
Center involved posterior capsule opacity, patient should be prepared to see Dr. Prudencio Burly in the near future for consideration of YAG capsulotomy to enhance and maximize visual acuity

## 2020-02-12 NOTE — Assessment & Plan Note (Signed)
Vastly improved OS status post vitrectomy membrane peel

## 2020-02-12 NOTE — Assessment & Plan Note (Signed)
Visual acuity impact OD secondary to epiretinal membrane.  Patient wished to proceed with vitrectomy and membrane peel right eye

## 2020-02-12 NOTE — Patient Instructions (Signed)
The patient understands the risks of anesthesia including but not limited to the rare occurrence of death. The patient also understands risks and benefits of the planned surgical procedure include but are not limited to hemorrhage, infection, scarring, loss of vision, progression of disease despite intervention, and need for another procedure.  1.  GIVE PREOPERATIVE EYE MEDICATION TO PATIENT TO TAKE HOME, AND DO NOT USE UNTIL POSTOPERATIVE VISIT THE NEXT DAY IN DR [facility] OFFICE UNLESS INSTRUCTED BY PHYSICIAN TO START THE NEXT DAY.   POSITIONS:  (if gas bubble placed into the eye)  A.  Do not sleep or rest on back  B.  Do not travel by airplane, to the mountains, or elevation above 2000 feet  C.

## 2020-03-10 ENCOUNTER — Ambulatory Visit (INDEPENDENT_AMBULATORY_CARE_PROVIDER_SITE_OTHER): Payer: Medicare Other

## 2020-03-10 ENCOUNTER — Other Ambulatory Visit: Payer: Self-pay

## 2020-03-10 DIAGNOSIS — H35371 Puckering of macula, right eye: Secondary | ICD-10-CM

## 2020-03-10 MED ORDER — OFLOXACIN 0.3 % OP SOLN
1.0000 [drp] | Freq: Four times a day (QID) | OPHTHALMIC | 0 refills | Status: AC
Start: 2020-03-15 — End: 2020-04-05

## 2020-03-10 MED ORDER — PREDNISOLONE ACETATE 1 % OP SUSP
1.0000 [drp] | Freq: Four times a day (QID) | OPHTHALMIC | 0 refills | Status: AC
Start: 2020-03-15 — End: 2020-04-05

## 2020-03-10 NOTE — Progress Notes (Signed)
03/10/2020     CHIEF COMPLAINT Patient presents for No chief complaint on file.   HISTORY OF PRESENT ILLNESS: Kelsey Mendoza is a 69 y.o. female who presents to the clinic today for:   HPI    Pre op OD  Vitrectomy and Membrane Peel   Last edited by Tilda Franco on 03/10/2020  1:20 PM. (History)        HISTORICAL INFORMATION:   Selected notes from the MEDICAL RECORD NUMBER       CURRENT MEDICATIONS: Current Outpatient Medications (Ophthalmic Drugs)  Medication Sig  . [START ON 03/15/2020] ofloxacin (OCUFLOX) 0.3 % ophthalmic solution Place 1 drop into the right eye 4 (four) times daily for 21 days.  . prednisoLONE acetate (PRED FORTE) 1 % ophthalmic suspension Place 1 drop into both eyes See admin instructions. Instill 1 drop into the right eye once daily for 4 days (should finish right eye on 6/8). Instill 1 drop into the left eye 3 times daily for 4 days, then twice daily for 7 days (starting on 6/9), then once daily for 7 days (Patient not taking: Reported on 02/12/2020)  . prednisoLONE acetate (PRED FORTE) 1 % ophthalmic suspension Place 1 drop into the left eye 4 (four) times daily. (Patient not taking: Reported on 02/12/2020)  . [START ON 03/15/2020] prednisoLONE acetate (PRED FORTE) 1 % ophthalmic suspension Place 1 drop into the right eye 4 (four) times daily for 21 days.   No current facility-administered medications for this visit. (Ophthalmic Drugs)   Current Outpatient Medications (Other)  Medication Sig  . amLODipine (NORVASC) 5 MG tablet Take 1 tablet (5 mg total) by mouth daily.  Marland Kitchen amoxicillin (AMOXIL) 500 MG capsule Take 2,000 mg by mouth See admin instructions. Take 2000 mg 1 hour prior to dental work  . clobetasol (TEMOVATE) 0.05 % external solution Apply 1 application topically 2 (two) times daily as needed (psoriasis).   . diphenhydramine-acetaminophen (TYLENOL PM) 25-500 MG TABS tablet Take 0.5 tablets by mouth at bedtime as needed (sleep).  .  furosemide (LASIX) 40 MG tablet Take 40 mg by mouth daily as needed for fluid.   Marland Kitchen gabapentin (NEURONTIN) 100 MG capsule Take 100 mg by mouth at bedtime. (Patient not taking: Reported on 09/25/2019)  . ibuprofen (ADVIL) 800 MG tablet Take 1 tablet (800 mg total) by mouth every 8 (eight) hours as needed.  Marland Kitchen ketoconazole (NIZORAL) 2 % shampoo Apply 1 application topically daily as needed (psoriasis).   Marland Kitchen loratadine (CLARITIN) 10 MG tablet Take 10 mg by mouth daily as needed for allergies.  Marland Kitchen losartan (COZAAR) 100 MG tablet Take 100 mg by mouth every evening.   . rosuvastatin (CRESTOR) 5 MG tablet Take 5 mg by mouth at bedtime.   . triamterene-hydrochlorothiazide (MAXZIDE-25) 37.5-25 MG tablet Take 1 tablet by mouth daily.  . urea (CARMOL) 20 % cream Apply 1 application topically daily as needed (dry skin).    No current facility-administered medications for this visit. (Other)     ALLERGIES Allergies  Allergen Reactions  . Trazodone Other (See Comments)    Caused violent, disturbing nightmares     PAST MEDICAL HISTORY Past Medical History:  Diagnosis Date  . 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 COPD (chronic obstructive pulmonary disease) ([place])   . CPAP (continuous positive airway pressure) dependence   . HLD (hyperlipidemia)   . Hypertension   . Knee pain   . Pre-diabetes   . Psoriasis of scalp   . Sleep  apnea    Past Surgical History:  Procedure Laterality Date  . ABDOMINAL HYSTERECTOMY    . BACK SURGERY    . BLADDER SURGERY    . CARPAL TUNNEL RELEASE Right 06/06/2017   Procedure: CARPAL TUNNEL RELEASE;  Surgeon: [name], MD;  Location: [facility] ORS;  Service: Orthopedics;  Laterality: Right;  . EYE SURGERY Left 11/26/2019   Dr. [name], Vit, Mem Peel  . I & D EXTREMITY Right 06/06/2017   Procedure: IRRIGATION AND DEBRIDEMENT EXTREMITY--RIGHT ARM;  Surgeon: [name], MD;  Location: [facility] ORS;  Service: Orthopedics;  Laterality: Right;  . KNEE ARTHROSCOPY Left  10/06/2019   Procedure: ARTHROSCOPY KNEE,PARTIAL MEDIAL MENISECTOMY,PARTIAL CHONDROPLASTY;  Surgeon: [name], MD;  Location: [facility] ORS;  Service: Orthopedics;  Laterality: Left;  . KNEE SURGERY    . OPEN REDUCTION INTERNAL FIXATION (ORIF) DISTAL RADIAL FRACTURE N/A 06/06/2017   Procedure: OPEN REDUCTION INTERNAL FIXATION (ORIF) DISTAL RADIAL FRACTURE;  Surgeon: [name], MD;  Location: [facility] ORS;  Service: Orthopedics;  Laterality: N/A;    FAMILY HISTORY Family History  Problem Relation Age of Onset  . Hypertension Mother   . Hypertension Father   . CAD Father   . Breast cancer Neg Hx     SOCIAL HISTORY Social History   Tobacco Use  . Smoking status: Former Smoker    Quit date: 03/01/1983    Years since quitting: 37.0  . Smokeless tobacco: Never Used  Vaping Use  . Vaping Use: Never used  Substance Use Topics  . Alcohol use: No  . Drug use: No         OPHTHALMIC EXAM: Base Eye Exam    Visual Acuity (Snellen - Linear)      Right Left   Dist [place] 20/50 -1 20/100 -1   Dist ph [place] NI 20/40 -2       Tonometry (Tonopen, 1:29 PM)      Right Left   Pressure 17 16       Neuro/Psych    Oriented x3: Yes   Mood/Affect: Normal          IMAGING AND PROCEDURES  Imaging and Procedures for @TODAY @           ASSESSMENT/PLAN:  No diagnosis found.  Ophthalmic Meds Ordered this visit:  Meds ordered this encounter  Medications  . prednisoLONE acetate (PRED FORTE) 1 % ophthalmic suspension    Sig: Place 1 drop into the right eye 4 (four) times daily for 21 days.    Dispense:  10 mL    Refill:  0  . ofloxacin (OCUFLOX) 0.3 % ophthalmic solution    Sig: Place 1 drop into the right eye 4 (four) times daily for 21 days.    Dispense:  5 mL    Refill:  0        Pre-op completed. Operative consent obtained with pre-op eye drops reviewed with Renaldo Fiddler and sent via [facility] as needed. Post op instructions reviewed with patient and per patient all  questions answered.  Tilda Franco

## 2020-03-11 ENCOUNTER — Ambulatory Visit (INDEPENDENT_AMBULATORY_CARE_PROVIDER_SITE_OTHER): Payer: Medicare Other

## 2020-03-17 ENCOUNTER — Encounter (INDEPENDENT_AMBULATORY_CARE_PROVIDER_SITE_OTHER): Payer: Medicare Other | Admitting: Ophthalmology

## 2020-03-17 DIAGNOSIS — H35371 Puckering of macula, right eye: Secondary | ICD-10-CM

## 2020-03-18 ENCOUNTER — Other Ambulatory Visit: Payer: Self-pay

## 2020-03-18 ENCOUNTER — Ambulatory Visit (INDEPENDENT_AMBULATORY_CARE_PROVIDER_SITE_OTHER): Payer: Medicare Other | Admitting: Ophthalmology

## 2020-03-18 ENCOUNTER — Encounter (INDEPENDENT_AMBULATORY_CARE_PROVIDER_SITE_OTHER): Payer: Self-pay | Admitting: Ophthalmology

## 2020-03-18 DIAGNOSIS — H35371 Puckering of macula, right eye: Secondary | ICD-10-CM

## 2020-03-18 DIAGNOSIS — Z09 Encounter for follow-up examination after completed treatment for conditions other than malignant neoplasm: Secondary | ICD-10-CM | POA: Insufficient documentation

## 2020-03-18 NOTE — Progress Notes (Signed)
03/18/2020     CHIEF COMPLAINT Patient presents for Post-op Follow-up   HISTORY OF PRESENT ILLNESS: Kelsey Mendoza is a 69 y.o. female who presents to the clinic today for:   HPI    Post-op Follow-up    In right eye.  Discomfort includes none.  Vision is stable.  I, the attending physician,  performed the HPI with the patient and updated documentation appropriately.          Comments    1 Day s\p Vitrectomy and Membrane Peel OD  Pt denies pain. Pt had a hard night due to anesthesia        Last edited by Tilda Franco on 03/18/2020  9:52 AM. (History)      Referring physician: [name], MD [address] [place],  [place] [address]  HISTORICAL INFORMATION:   Selected notes from the MEDICAL RECORD NUMBER       CURRENT MEDICATIONS: Current Outpatient Medications (Ophthalmic Drugs)  Medication Sig  . ofloxacin (OCUFLOX) 0.3 % ophthalmic solution Place 1 drop into the right eye 4 (four) times daily for 21 days.  . prednisoLONE acetate (PRED FORTE) 1 % ophthalmic suspension Place 1 drop into both eyes See admin instructions. Instill 1 drop into the right eye once daily for 4 days (should finish right eye on 6/8). Instill 1 drop into the left eye 3 times daily for 4 days, then twice daily for 7 days (starting on 6/9), then once daily for 7 days (Patient not taking: Reported on 02/12/2020)  . prednisoLONE acetate (PRED FORTE) 1 % ophthalmic suspension Place 1 drop into the left eye 4 (four) times daily. (Patient not taking: Reported on 02/12/2020)  . prednisoLONE acetate (PRED FORTE) 1 % ophthalmic suspension Place 1 drop into the right eye 4 (four) times daily for 21 days.   No current facility-administered medications for this visit. (Ophthalmic Drugs)   Current Outpatient Medications (Other)  Medication Sig  . amLODipine (NORVASC) 5 MG tablet Take 1 tablet (5 mg total) by mouth daily.  Marland Kitchen amoxicillin (AMOXIL) 500 MG capsule Take 2,000 mg by mouth See admin  instructions. Take 2000 mg 1 hour prior to dental work  . clobetasol (TEMOVATE) 0.05 % external solution Apply 1 application topically 2 (two) times daily as needed (psoriasis).   . diphenhydramine-acetaminophen (TYLENOL PM) 25-500 MG TABS tablet Take 0.5 tablets by mouth at bedtime as needed (sleep).  . furosemide (LASIX) 40 MG tablet Take 40 mg by mouth daily as needed for fluid.   Marland Kitchen gabapentin (NEURONTIN) 100 MG capsule Take 100 mg by mouth at bedtime. (Patient not taking: Reported on 09/25/2019)  . ibuprofen (ADVIL) 800 MG tablet Take 1 tablet (800 mg total) by mouth every 8 (eight) hours as needed.  Marland Kitchen ketoconazole (NIZORAL) 2 % shampoo Apply 1 application topically daily as needed (psoriasis).   Marland Kitchen loratadine (CLARITIN) 10 MG tablet Take 10 mg by mouth daily as needed for allergies.  Marland Kitchen losartan (COZAAR) 100 MG tablet Take 100 mg by mouth every evening.   . rosuvastatin (CRESTOR) 5 MG tablet Take 5 mg by mouth at bedtime.   . triamterene-hydrochlorothiazide (MAXZIDE-25) 37.5-25 MG tablet Take 1 tablet by mouth daily.  . urea (CARMOL) 20 % cream Apply 1 application topically daily as needed (dry skin).    No current facility-administered medications for this visit. (Other)      REVIEW OF SYSTEMS:    ALLERGIES Allergies  Allergen Reactions  . Trazodone Other (See Comments)  Caused violent, disturbing nightmares     PAST MEDICAL HISTORY Past Medical History:  Diagnosis Date  . Cancer ([place]) 2000s   bladder  . COPD (chronic obstructive pulmonary disease) ([place])   . CPAP (continuous positive airway pressure) dependence   . HLD (hyperlipidemia)   . Hypertension   . Knee pain   . Pre-diabetes   . Psoriasis of scalp   . Sleep apnea    Past Surgical History:  Procedure Laterality Date  . ABDOMINAL HYSTERECTOMY    . BACK SURGERY    . BLADDER SURGERY    . CARPAL TUNNEL RELEASE Right 06/06/2017   Procedure: CARPAL TUNNEL RELEASE;  Surgeon: [name], MD;  Location: [facility]  ORS;  Service: Orthopedics;  Laterality: Right;  . EYE SURGERY Left 11/26/2019   Dr. [name], Vit, Mem Peel  . I & D EXTREMITY Right 06/06/2017   Procedure: IRRIGATION AND DEBRIDEMENT EXTREMITY--RIGHT ARM;  Surgeon: [name], MD;  Location: [facility] ORS;  Service: Orthopedics;  Laterality: Right;  . KNEE ARTHROSCOPY Left 10/06/2019   Procedure: ARTHROSCOPY KNEE,PARTIAL MEDIAL MENISECTOMY,PARTIAL CHONDROPLASTY;  Surgeon: [name], MD;  Location: [facility] ORS;  Service: Orthopedics;  Laterality: Left;  . KNEE SURGERY    . OPEN REDUCTION INTERNAL FIXATION (ORIF) DISTAL RADIAL FRACTURE N/A 06/06/2017   Procedure: OPEN REDUCTION INTERNAL FIXATION (ORIF) DISTAL RADIAL FRACTURE;  Surgeon: [name], MD;  Location: [facility] ORS;  Service: Orthopedics;  Laterality: N/A;    FAMILY HISTORY Family History  Problem Relation Age of Onset  . Hypertension Mother   . Hypertension Father   . CAD Father   . Breast cancer Neg Hx     SOCIAL HISTORY Social History   Tobacco Use  . Smoking status: Former Smoker    Quit date: 03/01/1983    Years since quitting: 37.0  . Smokeless tobacco: Never Used  Vaping Use  . Vaping Use: Never used  Substance Use Topics  . Alcohol use: No  . Drug use: No         OPHTHALMIC EXAM: Base Eye Exam    Visual Acuity (Snellen - Linear)      Right Left   Dist [place] 20/200 20/80 -2   Dist ph  NI 20/50 +       Tonometry (Tonopen, 9:59 AM)      Right Left   Pressure 17 17       Pupils      Dark Light Shape React   Right 8 8 Round Dilated   Left           Neuro/Psych    Oriented x3: Yes   Mood/Affect: Normal        Slit Lamp and Fundus Exam    External Exam      Right Left   External Normal Normal       Slit Lamp Exam      Right Left   Lids/Lashes Normal Normal   Conjunctiva/Sclera 2+ Subconjunctival hemorrhage White and quiet   Cornea Clear Clear   Anterior Chamber Deep and quiet Deep and quiet   Iris Round and reactive Round and  reactive   Lens  Centered posterior chamber intraocular lens, 2+ Posterior capsular opacification centrally   Anterior Vitreous Normal Normal       Fundus Exam      Right Left   Posterior Vitreous Clear, vitrectomized    Disc Normal    C/D Ratio 0.65    Macula No topographic distortion    Vessels Normal  Periphery Normal           IMAGING AND PROCEDURES  Imaging and Procedures for 03/18/20           ASSESSMENT/PLAN:  Postoperative follow-up No lifting and bending for 1 week. No water in the eye for 10 days. Do not rub the eye. Wear shield at night for 1-3 days.  Wear your CPAP as normal, if instructed by your doctor.  Continue your topical medications for a total of 3 weeks.  Do not refill your postoperative medications unless instructed.      ICD-10-CM   1. Right epiretinal membrane  H35.371   2. Postoperative follow-up  Z09     1.  Patient asked to commence with topical drop therapy.  Patient to use these medications topically to the right eye for the next 4 weeks.  2.  Prednisolone acetate 1 drop right eye 4 times daily  Ofloxacin 1 drop right eye 4 times daily  3.  Ophthalmic Meds Ordered this visit:  No orders of the defined types were placed in this encounter.      Return in about 1 week (around 03/25/2020) for dilate, OD, OCT.  There are no Patient Instructions on file for this visit.   Explained the diagnoses, plan, and follow up with the patient and they expressed understanding.  Patient expressed understanding of the importance of proper follow up care.   Clent Demark Kelty Szafran M.D. Diseases & Surgery of the Retina and Vitreous Retina & Diabetic [place] 03/18/20     Abbreviations: M myopia (nearsighted); A astigmatism; H hyperopia (farsighted); P presbyopia; Mrx spectacle prescription;  CTL contact lenses; OD right eye; OS left eye; OU both eyes  XT exotropia; ET esotropia; PEK punctate epithelial keratitis; PEE punctate epithelial erosions;  DES dry eye syndrome; MGD meibomian gland dysfunction; ATs artificial tears; PFAT's preservative free artificial tears; [place] nuclear sclerotic cataract; PSC posterior subcapsular cataract; ERM epi-retinal membrane; PVD posterior vitreous detachment; RD retinal detachment; DM diabetes mellitus; DR diabetic retinopathy; NPDR non-proliferative diabetic retinopathy; PDR proliferative diabetic retinopathy; CSME clinically significant macular edema; DME diabetic macular edema; dbh dot blot hemorrhages; CWS cotton wool spot; POAG primary open angle glaucoma; C/D cup-to-disc ratio; HVF humphrey visual field; GVF goldmann visual field; OCT optical coherence tomography; IOP intraocular pressure; BRVO Branch retinal vein occlusion; CRVO central retinal vein occlusion; CRAO central retinal artery occlusion; BRAO branch retinal artery occlusion; RT retinal tear; SB scleral buckle; PPV pars plana vitrectomy; VH Vitreous hemorrhage; PRP panretinal laser photocoagulation; IVK intravitreal kenalog; VMT vitreomacular traction; MH Macular hole;  NVD neovascularization of the disc; NVE neovascularization elsewhere; AREDS age related eye disease study; ARMD age related macular degeneration; POAG primary open angle glaucoma; EBMD epithelial/anterior basement membrane dystrophy; ACIOL anterior chamber intraocular lens; IOL intraocular lens; PCIOL posterior chamber intraocular lens; Phaco/IOL phacoemulsification with intraocular lens placement; [place] photorefractive keratectomy; LASIK laser assisted in situ keratomileusis; HTN hypertension; DM diabetes mellitus; COPD chronic obstructive pulmonary disease

## 2020-03-18 NOTE — Assessment & Plan Note (Signed)
No lifting and bending for 1 week. No water in the eye for 10 days. Do not rub the eye. Wear shield at night for 1-3 days.  Wear your CPAP as normal, if instructed by your doctor.  Continue your topical medications for a total of 3 weeks.  Do not refill your postoperative medications unless instructed.   

## 2020-03-25 ENCOUNTER — Other Ambulatory Visit: Payer: Self-pay

## 2020-03-25 ENCOUNTER — Ambulatory Visit (INDEPENDENT_AMBULATORY_CARE_PROVIDER_SITE_OTHER): Payer: Medicare Other | Admitting: Ophthalmology

## 2020-03-25 ENCOUNTER — Encounter (INDEPENDENT_AMBULATORY_CARE_PROVIDER_SITE_OTHER): Payer: Self-pay | Admitting: Ophthalmology

## 2020-03-25 DIAGNOSIS — H26491 Other secondary cataract, right eye: Secondary | ICD-10-CM | POA: Diagnosis not present

## 2020-03-25 DIAGNOSIS — H35371 Puckering of macula, right eye: Secondary | ICD-10-CM | POA: Diagnosis not present

## 2020-03-25 NOTE — Patient Instructions (Signed)
Patient instructed to complete the topical medications to the right eye   ofloxacin 1 drop right eye 4 times daily  Prednisolone acetate 1 drop right eye 4 times daily  Patient instructed to discard any medications remaining at the end of 2 more weeks.  Patient instructed not to refill the medication

## 2020-03-25 NOTE — Assessment & Plan Note (Signed)
Macular findings the right eye continue to improve.  Central foveal thickness is largely the same post vitrectomy membrane peel, 1 week OD.  Likely to improve.  Patient does continue on CPAP usage With excellent compliance

## 2020-03-25 NOTE — Progress Notes (Signed)
03/25/2020     CHIEF COMPLAINT Patient presents for Post-op Follow-up (1 WK PO OD, SX 03/17/20////Pt reports blurry vision OD, "the worst floater she's ever had" OD, and aching OD. )   HISTORY OF PRESENT ILLNESS: Kelsey Mendoza is a 69 y.o. female who presents to the clinic today for:   HPI    Post-op Follow-up    In right eye.  Discomfort includes pain and floaters.  Vision is blurred at distance and is blurred at near. Additional comments: 1 WK PO OD, SX 03/17/20    Pt reports blurry vision OD, "the worst floater she's ever had" OD, and aching OD.        Last edited by Nichola Sizer D on 03/25/2020  8:36 AM. (History)      Referring physician: [name], MD [address] [place],  [place] [address]  HISTORICAL INFORMATION:   Selected notes from the MEDICAL RECORD NUMBER       CURRENT MEDICATIONS: Current Outpatient Medications (Ophthalmic Drugs)  Medication Sig  . ofloxacin (OCUFLOX) 0.3 % ophthalmic solution Place 1 drop into the right eye 4 (four) times daily for 21 days.  . prednisoLONE acetate (PRED FORTE) 1 % ophthalmic suspension Place 1 drop into both eyes See admin instructions. Instill 1 drop into the right eye once daily for 4 days (should finish right eye on 6/8). Instill 1 drop into the left eye 3 times daily for 4 days, then twice daily for 7 days (starting on 6/9), then once daily for 7 days (Patient not taking: Reported on 02/12/2020)  . prednisoLONE acetate (PRED FORTE) 1 % ophthalmic suspension Place 1 drop into the left eye 4 (four) times daily. (Patient not taking: Reported on 02/12/2020)  . prednisoLONE acetate (PRED FORTE) 1 % ophthalmic suspension Place 1 drop into the right eye 4 (four) times daily for 21 days.   No current facility-administered medications for this visit. (Ophthalmic Drugs)   Current Outpatient Medications (Other)  Medication Sig  . amLODipine (NORVASC) 5 MG tablet Take 1 tablet (5 mg total) by mouth daily.  Marland Kitchen  amoxicillin (AMOXIL) 500 MG capsule Take 2,000 mg by mouth See admin instructions. Take 2000 mg 1 hour prior to dental work  . clobetasol (TEMOVATE) 0.05 % external solution Apply 1 application topically 2 (two) times daily as needed (psoriasis).   . diphenhydramine-acetaminophen (TYLENOL PM) 25-500 MG TABS tablet Take 0.5 tablets by mouth at bedtime as needed (sleep).  . furosemide (LASIX) 40 MG tablet Take 40 mg by mouth daily as needed for fluid.   Marland Kitchen gabapentin (NEURONTIN) 100 MG capsule Take 100 mg by mouth at bedtime. (Patient not taking: Reported on 09/25/2019)  . ibuprofen (ADVIL) 800 MG tablet Take 1 tablet (800 mg total) by mouth every 8 (eight) hours as needed.  Marland Kitchen ketoconazole (NIZORAL) 2 % shampoo Apply 1 application topically daily as needed (psoriasis).   Marland Kitchen loratadine (CLARITIN) 10 MG tablet Take 10 mg by mouth daily as needed for allergies.  Marland Kitchen losartan (COZAAR) 100 MG tablet Take 100 mg by mouth every evening.   . rosuvastatin (CRESTOR) 5 MG tablet Take 5 mg by mouth at bedtime.   . triamterene-hydrochlorothiazide (MAXZIDE-25) 37.5-25 MG tablet Take 1 tablet by mouth daily.  . urea (CARMOL) 20 % cream Apply 1 application topically daily as needed (dry skin).    No current facility-administered medications for this visit. (Other)      REVIEW OF SYSTEMS:    ALLERGIES Allergies  Allergen  Reactions  . Trazodone Other (See Comments)    Caused violent, disturbing nightmares     PAST MEDICAL HISTORY Past Medical History:  Diagnosis Date  . 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 COPD (chronic obstructive pulmonary disease) ([place])   . CPAP (continuous positive airway pressure) dependence   . HLD (hyperlipidemia)   . Hypertension   . Knee pain   . Pre-diabetes   . Psoriasis of scalp   . Sleep apnea    Past Surgical History:  Procedure Laterality Date  . ABDOMINAL HYSTERECTOMY    . BACK SURGERY    . BLADDER SURGERY    . CARPAL TUNNEL RELEASE Right 06/06/2017   Procedure:  CARPAL TUNNEL RELEASE;  Surgeon: [name], MD;  Location: [facility] ORS;  Service: Orthopedics;  Laterality: Right;  . EYE SURGERY Left 11/26/2019   Dr. [name], Vit, Mem Peel  . I & D EXTREMITY Right 06/06/2017   Procedure: IRRIGATION AND DEBRIDEMENT EXTREMITY--RIGHT ARM;  Surgeon: [name], MD;  Location: [facility] ORS;  Service: Orthopedics;  Laterality: Right;  . KNEE ARTHROSCOPY Left 10/06/2019   Procedure: ARTHROSCOPY KNEE,PARTIAL MEDIAL MENISECTOMY,PARTIAL CHONDROPLASTY;  Surgeon: [name], MD;  Location: [facility] ORS;  Service: Orthopedics;  Laterality: Left;  . KNEE SURGERY    . OPEN REDUCTION INTERNAL FIXATION (ORIF) DISTAL RADIAL FRACTURE N/A 06/06/2017   Procedure: OPEN REDUCTION INTERNAL FIXATION (ORIF) DISTAL RADIAL FRACTURE;  Surgeon: [name], MD;  Location: [facility] ORS;  Service: Orthopedics;  Laterality: N/A;    FAMILY HISTORY Family History  Problem Relation Age of Onset  . Hypertension Mother   . Hypertension Father   . CAD Father   . Breast cancer Neg Hx     SOCIAL HISTORY Social History   Tobacco Use  . Smoking status: Former Smoker    Quit date: 03/01/1983    Years since quitting: 37.0  . Smokeless tobacco: Never Used  Vaping Use  . Vaping Use: Never used  Substance Use Topics  . Alcohol use: No  . Drug use: No         OPHTHALMIC EXAM: Base Eye Exam    Visual Acuity (ETDRS)      Right Left   Dist [place] 20/60 -2 20/80 +1   Dist ph [place] NI 20/50       Tonometry (Tonopen, 8:44 AM)      Right Left   Pressure 16 15       Pupils      Pupils Dark Light Shape React APD   Right PERRL 4 3 Round Brisk None   Left PERRL 4 3 Round Brisk None       Visual Fields (Counting fingers)      Left Right    Full Full       Extraocular Movement      Right Left    Full Full       Neuro/Psych    Oriented x3: Yes   Mood/Affect: Normal       Dilation    Right eye: 1.0% Mydriacyl, 2.5% Phenylephrine @ 8:44 AM        Slit Lamp and Fundus  Exam    External Exam      Right Left   External Normal Normal       Slit Lamp Exam      Right Left   Lids/Lashes Normal Normal   Conjunctiva/Sclera 2+ Subconjunctival hemorrhage White and quiet   Cornea Clear Clear   Anterior Chamber Deep and quiet Deep  and quiet   Iris Round and reactive Round and reactive   Lens Centered posterior chamber intraocular lens, 2+ Posterior capsular opacification Centered posterior chamber intraocular lens, 2+ Posterior capsular opacification centrally   Anterior Vitreous Normal Normal       Fundus Exam      Right Left   Posterior Vitreous Clear, vitrectomized    Disc Normal    C/D Ratio 0.5    Macula No topographic distortion    Vessels Normal    Periphery Normal           IMAGING AND PROCEDURES  Imaging and Procedures for 03/25/20  OCT, Retina - OU - Both Eyes       Right Eye Quality was good. Scan locations included subfoveal. Central Foveal Thickness: 454. Findings include abnormal foveal contour.   Left Eye Quality was good. Scan locations included subfoveal. Central Foveal Thickness: 400. Progression has been stable. Findings include abnormal foveal contour.   Notes Central foveal thickness is largely the same post vitrectomy membrane peel, 1 week OD.  Likely to improve.  Patient does continue on CPAP usage With excellent compliance                ASSESSMENT/PLAN:  Posterior capsular opacification, right PC opacification is present and accounts for visual symptoms. A YAG PC is recommended.  This procedure will maximize visual potential and allow enhanced medical monitoring of retinal conditions.  The risk and benefits were explained to the patient, including the risk of an increase in intraocular pressure temporarily after the procedure.   I will recommend patient follow-up with Dr. Katy Apo for consideration of YAG capsulotomy in the right eye to maximize visual potential  Right epiretinal membrane Macular  findings the right eye continue to improve.  Central foveal thickness is largely the same post vitrectomy membrane peel, 1 week OD.  Likely to improve.  Patient does continue on CPAP usage With excellent compliance      ICD-10-CM   1. Right epiretinal membrane  H35.371 OCT, Retina - OU - Both Eyes  2. Posterior capsular opacification, right  H26.491     1.  OD looks great post vitrectomy membrane peel, form the patient that the healing process begins now.  2.  Patient importantly does continue on CPAP as this improves his oxygenation of the macula through the nighttime and prevents nighttime hypoxia from sleep apnea  3.  Ophthalmic Meds Ordered this visit:  No orders of the defined types were placed in this encounter.      Return in about 10 weeks (around 06/03/2020) for DILATE OU, OCT.  Patient Instructions  Patient instructed to complete the topical medications to the right eye   ofloxacin 1 drop right eye 4 times daily  Prednisolone acetate 1 drop right eye 4 times daily  Patient instructed to discard any medications remaining at the end of 2 more weeks.  Patient instructed not to refill the medication     Explained the diagnoses, plan, and follow up with the patient and they expressed understanding.  Patient expressed understanding of the importance of proper follow up care.   Clent Demark Khyran Riera M.D. Diseases & Surgery of the Retina and Vitreous Retina & Diabetic [place] 03/25/20     Abbreviations: M myopia (nearsighted); A astigmatism; H hyperopia (farsighted); P presbyopia; Mrx spectacle prescription;  CTL contact lenses; OD right eye; OS left eye; OU both eyes  XT exotropia; ET esotropia; PEK punctate epithelial keratitis; PEE punctate epithelial erosions; DES  dry eye syndrome; MGD meibomian gland dysfunction; ATs artificial tears; PFAT's preservative free artificial tears; [place] nuclear sclerotic cataract; PSC posterior subcapsular cataract; ERM epi-retinal  membrane; PVD posterior vitreous detachment; RD retinal detachment; DM diabetes mellitus; DR diabetic retinopathy; NPDR non-proliferative diabetic retinopathy; PDR proliferative diabetic retinopathy; CSME clinically significant macular edema; DME diabetic macular edema; dbh dot blot hemorrhages; CWS cotton wool spot; POAG primary open angle glaucoma; C/D cup-to-disc ratio; HVF humphrey visual field; GVF goldmann visual field; OCT optical coherence tomography; IOP intraocular pressure; BRVO Branch retinal vein occlusion; CRVO central retinal vein occlusion; CRAO central retinal artery occlusion; BRAO branch retinal artery occlusion; RT retinal tear; SB scleral buckle; PPV pars plana vitrectomy; VH Vitreous hemorrhage; PRP panretinal laser photocoagulation; IVK intravitreal kenalog; VMT vitreomacular traction; MH Macular hole;  NVD neovascularization of the disc; NVE neovascularization elsewhere; AREDS age related eye disease study; ARMD age related macular degeneration; POAG primary open angle glaucoma; EBMD epithelial/anterior basement membrane dystrophy; ACIOL anterior chamber intraocular lens; IOL intraocular lens; PCIOL posterior chamber intraocular lens; Phaco/IOL phacoemulsification with intraocular lens placement; [place] photorefractive keratectomy; LASIK laser assisted in situ keratomileusis; HTN hypertension; DM diabetes mellitus; COPD chronic obstructive pulmonary disease

## 2020-03-25 NOTE — Assessment & Plan Note (Signed)
PC opacification is present and accounts for visual symptoms. A YAG PC is recommended.  This procedure will maximize visual potential and allow enhanced medical monitoring of retinal conditions.  The risk and benefits were explained to the patient, including the risk of an increase in intraocular pressure temporarily after the procedure.   I will recommend patient follow-up with Dr. Katy Apo for consideration of YAG capsulotomy in the right eye to maximize visual potential

## 2020-06-03 ENCOUNTER — Other Ambulatory Visit: Payer: Self-pay

## 2020-06-03 ENCOUNTER — Ambulatory Visit (INDEPENDENT_AMBULATORY_CARE_PROVIDER_SITE_OTHER): Payer: Medicare Other | Admitting: Ophthalmology

## 2020-06-03 ENCOUNTER — Encounter (INDEPENDENT_AMBULATORY_CARE_PROVIDER_SITE_OTHER): Payer: Self-pay | Admitting: Ophthalmology

## 2020-06-03 DIAGNOSIS — H26491 Other secondary cataract, right eye: Secondary | ICD-10-CM | POA: Diagnosis not present

## 2020-06-03 DIAGNOSIS — H35371 Puckering of macula, right eye: Secondary | ICD-10-CM | POA: Diagnosis not present

## 2020-06-03 NOTE — Assessment & Plan Note (Signed)
OD, some component of cloudy vision  Follow-up with Dr. Katy Apo for consideration of YAG capsulotomy right eye

## 2020-06-03 NOTE — Progress Notes (Signed)
06/03/2020     CHIEF COMPLAINT Patient presents for Retina Follow Up (10 Week f\u OU. OCT/Pt states she still sees a large floater in OD vision. Pt states vision is still blurry. )   HISTORY OF PRESENT ILLNESS: Kelsey Mendoza is a 70 y.o. female who presents to the clinic today for:   HPI    Retina Follow Up    Diagnosis: ERM.  In both eyes.  Severity is moderate.  Duration of 10 weeks.  Since onset it is stable.  I, the attending physician,  performed the HPI with the patient and updated documentation appropriately. Additional comments: 10 Week f\u OU. OCT Pt states she still sees a large floater in OD vision. Pt states vision is still blurry.        Last edited by Tilda Franco on 06/03/2020  8:40 AM. (History)      Referring physician: [name], MD [address],  [place] [id number]  HISTORICAL INFORMATION:   Selected notes from the MEDICAL RECORD NUMBER       CURRENT MEDICATIONS: Current Outpatient Medications (Ophthalmic Drugs)  Medication Sig  . prednisoLONE acetate (PRED FORTE) 1 % ophthalmic suspension Place 1 drop into both eyes See admin instructions. Instill 1 drop into the right eye once daily for 4 days (should finish right eye on 6/8). Instill 1 drop into the left eye 3 times daily for 4 days, then twice daily for 7 days (starting on 6/9), then once daily for 7 days (Patient not taking: Reported on 02/12/2020)  . prednisoLONE acetate (PRED FORTE) 1 % ophthalmic suspension Place 1 drop into the left eye 4 (four) times daily. (Patient not taking: Reported on 02/12/2020)   No current facility-administered medications for this visit. (Ophthalmic Drugs)   Current Outpatient Medications (Other)  Medication Sig  . amLODipine (NORVASC) 5 MG tablet Take 1 tablet (5 mg total) by mouth daily.  Marland Kitchen amoxicillin (AMOXIL) 500 MG capsule Take 2,000 mg by mouth See admin instructions. Take 2000 mg 1 hour prior to dental work  . clobetasol (TEMOVATE) 0.05 %  external solution Apply 1 application topically 2 (two) times daily as needed (psoriasis).   . diphenhydramine-acetaminophen (TYLENOL PM) 25-500 MG TABS tablet Take 0.5 tablets by mouth at bedtime as needed (sleep).  . furosemide (LASIX) 40 MG tablet Take 40 mg by mouth daily as needed for fluid.   Marland Kitchen gabapentin (NEURONTIN) 100 MG capsule Take 100 mg by mouth at bedtime. (Patient not taking: Reported on 09/25/2019)  . ibuprofen (ADVIL) 800 MG tablet Take 1 tablet (800 mg total) by mouth every 8 (eight) hours as needed.  Marland Kitchen ketoconazole (NIZORAL) 2 % shampoo Apply 1 application topically daily as needed (psoriasis).   Marland Kitchen loratadine (CLARITIN) 10 MG tablet Take 10 mg by mouth daily as needed for allergies.  Marland Kitchen losartan (COZAAR) 100 MG tablet Take 100 mg by mouth every evening.   . rosuvastatin (CRESTOR) 5 MG tablet Take 5 mg by mouth at bedtime.   . triamterene-hydrochlorothiazide (MAXZIDE-25) 37.5-25 MG tablet Take 1 tablet by mouth daily.  . urea (CARMOL) 20 % cream Apply 1 application topically daily as needed (dry skin).    No current facility-administered medications for this visit. (Other)      REVIEW OF SYSTEMS:    ALLERGIES Allergies  Allergen Reactions  . Trazodone Other (See Comments)    Caused violent, disturbing nightmares     PAST MEDICAL HISTORY Past Medical History:  Diagnosis Date  .  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 COPD (chronic obstructive pulmonary disease) ([employer])   . CPAP (continuous positive airway pressure) dependence   . HLD (hyperlipidemia)   . Hypertension   . Knee pain   . Pre-diabetes   . Psoriasis of scalp   . Sleep apnea    Past Surgical History:  Procedure Laterality Date  . ABDOMINAL HYSTERECTOMY    . BACK SURGERY    . BLADDER SURGERY    . CARPAL TUNNEL RELEASE Right 06/06/2017   Procedure: CARPAL TUNNEL RELEASE;  Surgeon: [name], MD;  Location: [facility];  Service: Orthopedics;  Laterality: Right;  . EYE SURGERY Left 11/26/2019   Dr.  [name], Vit, Mem Peel  . I & D EXTREMITY Right 06/06/2017   Procedure: IRRIGATION AND DEBRIDEMENT EXTREMITY--RIGHT ARM;  Surgeon: [name], MD;  Location: [facility];  Service: Orthopedics;  Laterality: Right;  . KNEE ARTHROSCOPY Left 10/06/2019   Procedure: ARTHROSCOPY KNEE,PARTIAL MEDIAL MENISECTOMY,PARTIAL CHONDROPLASTY;  Surgeon: [name], MD;  Location: [facility];  Service: Orthopedics;  Laterality: Left;  . KNEE SURGERY    . OPEN REDUCTION INTERNAL FIXATION (ORIF) DISTAL RADIAL FRACTURE N/A 06/06/2017   Procedure: OPEN REDUCTION INTERNAL FIXATION (ORIF) DISTAL RADIAL FRACTURE;  Surgeon: [name], MD;  Location: [facility];  Service: Orthopedics;  Laterality: N/A;    FAMILY HISTORY Family History  Problem Relation Age of Onset  . Hypertension Mother   . Hypertension Father   . CAD Father   . Breast cancer Neg Hx     SOCIAL HISTORY Social History   Tobacco Use  . Smoking status: Former Smoker    Quit date: 03/01/1983    Years since quitting: 37.2  . Smokeless tobacco: Never Used  Vaping Use  . Vaping Use: Never used  Substance Use Topics  . Alcohol use: No  . Drug use: No         OPHTHALMIC EXAM:  Base Eye Exam    Visual Acuity (Snellen - Linear)      Right Left   Dist [place] 20/30 -1 20/100 -1   Dist ph [place]  20/30 -2       Tonometry (Tonopen, 8:45 AM)      Right Left   Pressure 17 17       Pupils      Pupils Dark Light Shape React APD   Right PERRL 4 3 Round Slow None   Left PERRL 4 3 Round Slow None       Visual Fields (Counting fingers)      Left Right    Full Full       Neuro/Psych    Oriented x3: Yes   Mood/Affect: Normal       Dilation    Both eyes: 1.0% Mydriacyl, 2.5% Phenylephrine @ 8:45 AM        Slit Lamp and Fundus Exam    External Exam      Right Left   External Normal Normal       Slit Lamp Exam      Right Left   Lids/Lashes Normal Normal   Conjunctiva/Sclera 2+ Subconjunctival hemorrhage White and quiet    Cornea Clear Clear   Anterior Chamber Deep and quiet Deep and quiet   Iris Round and reactive Round and reactive   Lens Centered posterior chamber intraocular lens, 2+ Posterior capsular opacification Centered posterior chamber intraocular lens, 2+ Posterior capsular opacification centrally   Anterior Vitreous Normal Normal  Fundus Exam      Right Left   Posterior Vitreous Clear, vitrectomized Clear, vitrectomized   Disc Normal Normal   C/D Ratio 0.7 0.75   Macula less  topographic distortion  no exudates, no hemorrhage, no topographic distortions   Vessels Normal Normal   Periphery Normal Normal          IMAGING AND PROCEDURES  Imaging and Procedures for 06/03/20  OCT, Retina - OU - Both Eyes       Right Eye Quality was good. Scan locations included subfoveal. Central Foveal Thickness: 371. Findings include abnormal foveal contour.   Left Eye Quality was good. Scan locations included subfoveal. Central Foveal Thickness: 388. Progression has been stable. Findings include abnormal foveal contour.   Notes Central foveal thickness is largely the same post vitrectomy membrane peel, 10 week OD.   Macular findings of the right eye vastly improved as compared to prior to surgical intervention.  Preoperative 421 m thickness is now improved in the right eye to 371 m today  Patient does continue on CPAP usage With excellent compliance  OS, preoperative central Thickness was 449 m and is now improved to 388 m postoperatively                ASSESSMENT/PLAN:  Right epiretinal membrane Postoperatively, macular findings in the right I have continued to improve dramatically as compared to preop from 10 weeks ago.  Visual acuity is improved and 20/50 in the right eye to now 20/30    see notification regarding posterior capsule opacification  Posterior capsular opacification, right OD, some component of cloudy vision  Follow-up with Dr. Katy Apo for  consideration of YAG capsulotomy right eye      ICD-10-CM   1. Right epiretinal membrane  H35.371 OCT, Retina - OU - Both Eyes  2. Posterior capsular opacification, right  H26.491     1.  Patient has completed topical medications to the right eye.  Visual acuity is improved as well as macular topography.  Now 10 weeks post vitrectomy membrane peel.  2.  Still has complaints of some fogger haziness to the right eye.  I explained that her acuity is improving and level of the retina yet moderate posterior capsule opacification exist and she is deserves to be seen again in follow-up with Dr. Katy Apo and consideration of YAG capsulotomy in the right eye to maximize visual potential  3.  Ophthalmic Meds Ordered this visit:  No orders of the defined types were placed in this encounter.      Return in about 1 year (around 06/03/2021) for COLOR FP, DILATE OU, OCT.  There are no Patient Instructions on file for this visit.   Explained the diagnoses, plan, and follow up with the patient and they expressed understanding.  Patient expressed understanding of the importance of proper follow up care.   Clent Demark Yahir Tavano M.D. Diseases & Surgery of the Retina and Vitreous Retina & Diabetic [place] 06/03/20     Abbreviations: M myopia (nearsighted); A astigmatism; H hyperopia (farsighted); P presbyopia; Mrx spectacle prescription;  CTL contact lenses; OD right eye; OS left eye; OU both eyes  XT exotropia; ET esotropia; PEK punctate epithelial keratitis; PEE punctate epithelial erosions; DES dry eye syndrome; MGD meibomian gland dysfunction; ATs artificial tears; PFAT's preservative free artificial tears; [place] nuclear sclerotic cataract; PSC posterior subcapsular cataract; ERM epi-retinal membrane; PVD posterior vitreous detachment; RD retinal detachment; DM diabetes mellitus; DR diabetic retinopathy; NPDR non-proliferative diabetic retinopathy; PDR proliferative  diabetic retinopathy; CSME  clinically significant macular edema; DME diabetic macular edema; dbh dot blot hemorrhages; CWS cotton wool spot; POAG primary open angle glaucoma; C/D cup-to-disc ratio; HVF humphrey visual field; GVF goldmann visual field; OCT optical coherence tomography; IOP intraocular pressure; BRVO Branch retinal vein occlusion; CRVO central retinal vein occlusion; CRAO central retinal artery occlusion; BRAO branch retinal artery occlusion; RT retinal tear; SB scleral buckle; PPV pars plana vitrectomy; VH Vitreous hemorrhage; PRP panretinal laser photocoagulation; IVK intravitreal kenalog; VMT vitreomacular traction; MH Macular hole;  NVD neovascularization of the disc; NVE neovascularization elsewhere; AREDS age related eye disease study; ARMD age related macular degeneration; POAG primary open angle glaucoma; EBMD epithelial/anterior basement membrane dystrophy; ACIOL anterior chamber intraocular lens; IOL intraocular lens; PCIOL posterior chamber intraocular lens; Phaco/IOL phacoemulsification with intraocular lens placement; [place] photorefractive keratectomy; LASIK laser assisted in situ keratomileusis; HTN hypertension; DM diabetes mellitus; COPD chronic obstructive pulmonary disease

## 2020-06-03 NOTE — Assessment & Plan Note (Signed)
Postoperatively, macular findings in the right I have continued to improve dramatically as compared to preop from 10 weeks ago.  Visual acuity is improved and 20/50 in the right eye to now 20/30    see notification regarding posterior capsule opacification

## 2020-08-29 ENCOUNTER — Ambulatory Visit
Admission: EM | Admit: 2020-08-29 | Discharge: 2020-08-29 | Disposition: A | Discharge status: Home / Self Care | Payer: Medicare Other | Attending: Family Medicine | Admitting: Family Medicine

## 2020-08-29 ENCOUNTER — Other Ambulatory Visit: Payer: Self-pay

## 2020-08-29 ENCOUNTER — Encounter: Payer: Self-pay | Admitting: Emergency Medicine

## 2020-08-29 DIAGNOSIS — L03119 Cellulitis of unspecified part of limb: Secondary | ICD-10-CM | POA: Diagnosis not present

## 2020-08-29 DIAGNOSIS — W540XXA Bitten by dog, initial encounter: Secondary | ICD-10-CM | POA: Diagnosis not present

## 2020-08-29 MED ORDER — MUPIROCIN 2 % EX OINT: 1.0000 "application " | TOPICAL_OINTMENT | Freq: Three times a day (TID) | CUTANEOUS | 0 refills | Status: AC

## 2020-08-29 MED ORDER — AMOXICILLIN 500 MG PO CAPS: 2000.0000 mg | ORAL_CAPSULE | ORAL | 1 refills | Status: DC

## 2020-08-29 MED ORDER — AMOXICILLIN-POT CLAVULANATE 875-125 MG PO TABS
1.0000 | ORAL_TABLET | Freq: Two times a day (BID) | ORAL | 0 refills | Status: DC
Start: 2020-08-29 — End: 2021-03-20

## 2020-08-29 NOTE — ED Triage Notes (Signed)
Patient states that her dog bit her left hand on Friday.  Patient states that her dog is UTD on it's rabies vaccination.  Patient c/o redness, swelling, and pain at the site.  Patient denies fevers.

## 2020-08-29 NOTE — ED Notes (Signed)
Patient brought in paper from the vet showing that her dog is UTD on rabies vaccine that was received in Feb 2022 and would not be due for it's next rabies vaccine until 05/29/2023.

## 2020-08-29 NOTE — ED Notes (Signed)
Dog Bite reported to [facility] [employer] at [phone]

## 2020-08-29 NOTE — ED Provider Notes (Signed)
[facility]    CSN: [id number] Arrival date & time: 08/29/20  2355  History   Chief Complaint Chief Complaint  Patient presents with  . Animal Bite    Left hand   HPI  70 year old female presents with the above complaint.  Patient was bitten by her dog on the left hand on Friday.  Dog is up-to-date on his vaccinations.  She has developed redness and swelling and pain at the site.  Pain is mild, 2/10 in severity.  Location of the puncture wound is at the MCP joint of the index finger.  She has taken 1 dose of amoxicillin without resolution.  No other medications or interventions tried.  Tetanus is up-to-date.  No other complaints.  Past Medical History:  Diagnosis Date  . 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 COPD (chronic obstructive pulmonary disease) ([place])   . CPAP (continuous positive airway pressure) dependence   . HLD (hyperlipidemia)   . Hypertension   . Knee pain   . Pre-diabetes   . Psoriasis of scalp   . Sleep apnea     Patient Active Problem List   Diagnosis Date Noted  . Posterior capsular opacification, right 03/25/2020  . Postoperative follow-up 03/18/2020  . Left posterior capsular opacification 02/12/2020  . Left epiretinal membrane 11/10/2019  . Right epiretinal membrane 11/10/2019  . Cystoid macular edema of left eye 11/10/2019  . Glaucoma suspect, bilateral 11/10/2019  . Cancer ([place]) 10/04/2019  . Closed Colles' fracture 07/03/2017  . Distal radius fracture, right 06/05/2017  . Contusion of left knee 11/27/2016  . Contusion of right knee 11/27/2016  . Depression 03/26/2014  . Depressed affect 09/25/2013  . Difficulty sleeping 09/25/2013  . Diabetes mellitus without complication ([place]) 73/22/0254  . 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 Diverticulosis of colon 07/17/2012  . Acute blood loss anemia 07/16/2012  . Arthritis 07/14/2012  . Bladder cancer ([place]) 07/14/2012    Past Surgical History:  Procedure Laterality Date  . ABDOMINAL  HYSTERECTOMY    . BACK SURGERY    . BLADDER SURGERY    . CARPAL TUNNEL RELEASE Right 06/06/2017   Procedure: CARPAL TUNNEL RELEASE;  Surgeon: [name], MD;  Location: [facility];  Service: Orthopedics;  Laterality: Right;  . EYE SURGERY Left 11/26/2019   Dr. [name], Vit, Mem Peel  . I & D EXTREMITY Right 06/06/2017   Procedure: IRRIGATION AND DEBRIDEMENT EXTREMITY--RIGHT ARM;  Surgeon: [name], MD;  Location: [facility];  Service: Orthopedics;  Laterality: Right;  . KNEE ARTHROSCOPY Left 10/06/2019   Procedure: ARTHROSCOPY KNEE,PARTIAL MEDIAL MENISECTOMY,PARTIAL CHONDROPLASTY;  Surgeon: [name], MD;  Location: [facility];  Service: Orthopedics;  Laterality: Left;  . KNEE SURGERY    . OPEN REDUCTION INTERNAL FIXATION (ORIF) DISTAL RADIAL FRACTURE N/A 06/06/2017   Procedure: OPEN REDUCTION INTERNAL FIXATION (ORIF) DISTAL RADIAL FRACTURE;  Surgeon: [name], MD;  Location: [facility];  Service: Orthopedics;  Laterality: N/A;    OB History   No obstetric history on file.      Home Medications    Prior to Admission medications   Medication Sig Start Date End Date Taking? Authorizing Provider  amLODipine (NORVASC) 5 MG tablet Take 1 tablet (5 mg total) by mouth daily. 08/16/16 09/25/19 Yes Merlyn Lot, MD  amoxicillin-clavulanate (AUGMENTIN) 875-125 MG tablet Take 1 tablet by mouth 2 (two) times daily. 08/29/20  Yes Demarrius Guerrero G, DO  furosemide (LASIX) 40 MG tablet Take 40 mg by mouth daily as needed for fluid.  Yes [provider]  gabapentin (NEURONTIN) 100 MG capsule Take 100 mg by mouth at bedtime.   Yes [provider]  loratadine (CLARITIN) 10 MG tablet Take 10 mg by mouth daily as needed for allergies.   Yes [provider]  losartan (COZAAR) 100 MG tablet Take 100 mg by mouth every evening.    Yes [provider]  rosuvastatin (CRESTOR) 5 MG tablet Take 5 mg by mouth at bedtime.    Yes [provider]   triamterene-hydrochlorothiazide (MAXZIDE-25) 37.5-25 MG tablet Take 1 tablet by mouth daily.   Yes [provider]  amoxicillin (AMOXIL) 500 MG capsule Take 4 capsules (2,000 mg total) by mouth See admin instructions. Take 2000 mg 1 hour prior to dental work 08/29/20   Coral Spikes, DO  clobetasol (TEMOVATE) 0.05 % external solution Apply 1 application topically 2 (two) times daily as needed (psoriasis).  05/14/19   [provider]  diphenhydramine-acetaminophen (TYLENOL PM) 25-500 MG TABS tablet Take 0.5 tablets by mouth at bedtime as needed (sleep).    [provider]  ibuprofen (ADVIL) 800 MG tablet Take 1 tablet (800 mg total) by mouth every 8 (eight) hours as needed. 10/06/19   Hooten, Laurice Record, MD  ketoconazole (NIZORAL) 2 % shampoo Apply 1 application topically daily as needed (psoriasis).     [provider]  prednisoLONE acetate (PRED FORTE) 1 % ophthalmic suspension Place 1 drop into both eyes See admin instructions. Instill 1 drop into the right eye once daily for 4 days (should finish right eye on 6/8). Instill 1 drop into the left eye 3 times daily for 4 days, then twice daily for 7 days (starting on 6/9), then once daily for 7 days Patient not taking: Reported on 02/12/2020    [provider]  prednisoLONE acetate (PRED FORTE) 1 % ophthalmic suspension Place 1 drop into the left eye 4 (four) times daily. Patient not taking: Reported on 02/12/2020 11/19/19   Rankin, Clent Demark, MD  urea (CARMOL) 20 % cream Apply 1 application topically daily as needed (dry skin).     [provider]    Family History Family History  Problem Relation Age of Onset  . Hypertension Mother   . Hypertension Father   . CAD Father   . Breast cancer Neg Hx     Social History Social History   Tobacco Use  . Smoking status: Former Smoker    Quit date: 03/01/1983    Years since quitting: 37.5  . Smokeless tobacco: Never Used  Vaping Use  . Vaping Use: Never  used  Substance Use Topics  . Alcohol use: No  . Drug use: No     Allergies   Trazodone   Review of Systems Review of Systems  Constitutional: Negative.   Skin: Positive for wound.   Physical Exam Triage Vital Signs ED Triage Vitals  Enc Vitals Group     BP 08/29/20 0839 (!) 142/79     Pulse Rate 08/29/20 0839 62     Resp 08/29/20 0839 14     Temp 08/29/20 0839 98.2 F (36.8 C)     Temp Source 08/29/20 0839 Oral     SpO2 08/29/20 0839 99 %     Weight 08/29/20 0836 205 lb (93 kg)     Height 08/29/20 0836 5\' 5"  (1.651 m)     Head Circumference --      Peak Flow --      Pain Score 08/29/20 0836  2     Pain Loc --      Pain Edu? --      Excl. in [place]? --    Updated Vital Signs BP (!) 142/79 (BP Location: Right Arm)   Pulse 62   Temp 98.2 F (36.8 C) (Oral)   Resp 14   Ht 5\' 5"  (1.651 m)   Wt 93 kg   SpO2 99%   BMI 34.11 kg/m   Visual Acuity Right Eye Distance:   Left Eye Distance:   Bilateral Distance:    Right Eye Near:   Left Eye Near:    Bilateral Near:     Physical Exam Vitals and nursing note reviewed.  Constitutional:      General: She is not in acute distress.    Appearance: Normal appearance. She is not ill-appearing.  HENT:     Head: Normocephalic and atraumatic.  Eyes:     General:        Right eye: No discharge.        Left eye: No discharge.     Conjunctiva/sclera: Conjunctivae normal.  Pulmonary:     Effort: Pulmonary effort is normal. No respiratory distress.  Skin:    Comments: Left hand - small puncture wound (at the dorsum, MCP joint of the index finger) with surrounding erythema and edema.   Neurological:     Mental Status: She is alert.  Psychiatric:        Mood and Affect: Mood normal.        Behavior: Behavior normal.    UC Treatments / Results  Labs (all labs ordered are listed, but only abnormal results are displayed) Labs Reviewed - No data to display  EKG   Radiology No results found.  Procedures Procedures  (including critical care time)  Medications Ordered in UC Medications - No data to display  Initial Impression / Assessment and Plan / UC Course  I have reviewed the triage vital signs and the nursing notes.  Pertinent labs & imaging results that were available during my care of the patient were reviewed by me and considered in my medical decision making (see chart for details).    70 year old female presents with an infected dog bite. Augmentin as prescribed. Supportive care.   Final Clinical Impressions(s) / UC Diagnoses   Final diagnoses:  Cellulitis of hand  Dog bite, initial encounter   Discharge Instructions   None    ED Prescriptions    Medication Sig Dispense Auth. Provider   amoxicillin (AMOXIL) 500 MG capsule Take 4 capsules (2,000 mg total) by mouth See admin instructions. Take 2000 mg 1 hour prior to dental work 4 capsule [place], [place] G, DO   amoxicillin-clavulanate (AUGMENTIN) 875-125 MG tablet Take 1 tablet by mouth 2 (two) times daily. 20 tablet Coral Spikes, DO     PDMP not reviewed this encounter.   Thersa Salt [place], [place] 08/29/20 [phone]

## 2020-11-08 ENCOUNTER — Other Ambulatory Visit: Payer: Self-pay | Admitting: Family Medicine

## 2020-11-08 DIAGNOSIS — Z1231 Encounter for screening mammogram for malignant neoplasm of breast: Secondary | ICD-10-CM

## 2020-11-25 ENCOUNTER — Ambulatory Visit
Admission: RE | Admit: 2020-11-25 | Discharge: 2020-11-25 | Disposition: A | Discharge status: Home / Self Care | Payer: Medicare Other | Source: Ambulatory Visit | Attending: Family Medicine | Admitting: Family Medicine

## 2020-11-25 ENCOUNTER — Other Ambulatory Visit: Payer: Self-pay

## 2020-11-25 DIAGNOSIS — Z1231 Encounter for screening mammogram for malignant neoplasm of breast: Secondary | ICD-10-CM | POA: Insufficient documentation

## 2021-03-20 ENCOUNTER — Other Ambulatory Visit: Payer: Self-pay

## 2021-03-20 ENCOUNTER — Ambulatory Visit
Admission: EM | Admit: 2021-03-20 | Discharge: 2021-03-20 | Disposition: A | Discharge status: Home / Self Care | Payer: Medicare Other | Attending: Physician Assistant | Admitting: Physician Assistant

## 2021-03-20 DIAGNOSIS — R051 Acute cough: Secondary | ICD-10-CM | POA: Insufficient documentation

## 2021-03-20 DIAGNOSIS — R509 Fever, unspecified: Secondary | ICD-10-CM | POA: Insufficient documentation

## 2021-03-20 DIAGNOSIS — U071 COVID-19: Secondary | ICD-10-CM | POA: Insufficient documentation

## 2021-03-20 LAB — RESP PANEL BY RT-PCR (FLU A&B, COVID) ARPGX2
Influenza A by PCR: NEGATIVE
Influenza B by PCR: NEGATIVE
SARS Coronavirus 2 by RT PCR: POSITIVE — AB

## 2021-03-20 MED ORDER — BENZONATATE 200 MG PO CAPS: 200.0000 mg | ORAL_CAPSULE | Freq: Three times a day (TID) | ORAL | 0 refills | Status: DC | PRN

## 2021-03-20 MED ORDER — PAXLOVID (150/100) 10 X 150 MG & 10 X 100MG PO TBPK
2.0000 | ORAL_TABLET | Freq: Two times a day (BID) | ORAL | 0 refills | Status: AC
Start: 2021-03-20 — End: 2021-03-25

## 2021-03-20 NOTE — ED Provider Notes (Signed)
[facility]    CSN: [id number] Arrival date & time: 03/20/21  7322      History   Chief Complaint Chief Complaint  Patient presents with   Cough   Nasal Congestion    HPI Kelsey Mendoza is a 70 y.o. female presenting for 2 to 3-day history of fever up to 101 degrees, fatigue, headaches, cough and congestion.  Denies chest pain or breathing difficulty.  No vomiting or diarrhea.  Patient denies any sick contacts.  States she lives alone.  Has taken over-the-counter antipyretics but no cough medicine.  Patient reports history of high blood pressure and does not like liquid cough medicine so she has not taken any recently.  Other than hypertension, she has a history of hyperlipidemia, and diabetes.  Not vaccinated for COVID.  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/10/2019   Glaucoma suspect, bilateral 11/10/2019   Cancer ([place]) 10/04/2019   Closed Colles' fracture 07/03/2017   Distal radius fracture, right 06/05/2017   Contusion of left knee 11/27/2016   Contusion of right knee 11/27/2016   Depression 03/26/2014   Depressed affect 09/25/2013   Difficulty sleeping 09/25/2013   Diabetes mellitus without complication ([place]) 02/54/2706   Diverticular disease 07/17/2012   Diverticulosis of colon 07/17/2012   Acute blood loss anemia 07/16/2012   Arthritis 07/14/2012   Bladder cancer ([place]) 07/14/2012    Past Surgical History:  Procedure Laterality Date    ABDOMINAL HYSTERECTOMY     BACK SURGERY     BLADDER SURGERY     CARPAL TUNNEL RELEASE Right 06/06/2017   Procedure: CARPAL TUNNEL RELEASE;  Surgeon: [name], MD;  Location: [facility] OR[facility];  Service: Orthopedics;  Laterality: Right;   EYE SURGERY Left 11/26/2019   Dr. [name], Vit, Mem Rito Ehrlich   I & D EXTREMITY Right 06/06/2017   Procedure: IRRIGATION AND DEBRIDEMENT EXTREMITY--RIGHT ARM;  Surgeon: [name], MD;  Location: [facility] OR[facility];  Service: Orthopedics;  Laterality: Right;   KNEE ARTHROSCOPY Left 10/06/2019   Procedure: ARTHROSCOPY KNEE,PARTIAL MEDIAL MENISECTOMY,PARTIAL CHONDROPLASTY;  Surgeon: [name], MD;  Location: [facility] OR[facility];  Service: Orthopedics;  Laterality: Left;   KNEE SURGERY     OPEN REDUCTION INTERNAL FIXATION (ORIF) DISTAL RADIAL FRACTURE N/A 06/06/2017   Procedure: OPEN REDUCTION INTERNAL FIXATION (ORIF) DISTAL RADIAL FRACTURE;  Surgeon: [name], MD;  Location: [facility];  Service: Orthopedics;  Laterality: N/A;    OB History   No obstetric history on file.      Home Medications    Prior to Admission medications   Medication Sig Start Date End Date Taking? Authorizing Provider  amLODipine (NORVASC) 5 MG tablet Take 1 tablet (5 mg total) by mouth daily. 08/16/16 03/20/21 Yes Merlyn Lot, MD  benzonatate (TESSALON) 200 MG capsule Take 1 capsule (200 mg total) by mouth 3 (three) times daily as needed for cough. 03/20/21  Yes Laurene Footman B, PA-C  furosemide (LASIX) 40 MG  tablet Take 40 mg by mouth daily as needed for fluid.    Yes [provider]  gabapentin (NEURONTIN) 100 MG capsule Take 100 mg by mouth at bedtime.   Yes [provider]  losartan (COZAAR) 100 MG tablet Take 100 mg by mouth every evening.    Yes [provider]  nirmatrelvir & ritonavir (PAXLOVID, 150/100,) 10 x 150 MG & 10 x 100MG  TBPK Take 2 tablets by mouth 2 (two) times daily for 5 days. 03/20/21 03/25/21 Yes Laurene Footman B, PA-C  rosuvastatin  (CRESTOR) 5 MG tablet Take 5 mg by mouth at bedtime.    Yes [provider]  triamterene-hydrochlorothiazide (MAXZIDE-25) 37.5-25 MG tablet Take 1 tablet by mouth daily.   Yes [provider]  loratadine (CLARITIN) 10 MG tablet Take 10 mg by mouth daily as needed for allergies.    [provider]  prednisoLONE acetate (PRED FORTE) 1 % ophthalmic suspension Place 1 drop into both eyes See admin instructions. Instill 1 drop into the right eye once daily for 4 days (should finish right eye on 6/8). Instill 1 drop into the left eye 3 times daily for 4 days, then twice daily for 7 days (starting on 6/9), then once daily for 7 days Patient not taking: Reported on 02/12/2020    [provider]  prednisoLONE acetate (PRED FORTE) 1 % ophthalmic suspension Place 1 drop into the left eye 4 (four) times daily. Patient not taking: Reported on 02/12/2020 11/19/19   Rankin, Clent Demark, MD  urea (CARMOL) 20 % cream Apply 1 application topically daily as needed (dry skin).     [provider]    Family History Family History  Problem Relation Age of Onset   Hypertension Mother    Hypertension Father    CAD Father    Breast cancer Neg Hx     Social History Social History   Tobacco Use   Smoking status: Former    Types: Cigarettes    Quit date: 03/01/1983    Years since quitting: 38.0   Smokeless tobacco: Never  Vaping Use   Vaping Use: Never used  Substance Use Topics   Alcohol use: No   Drug use: No     Allergies   Trazodone   Review of Systems Review of Systems  Constitutional:  Positive for fatigue and fever. Negative for chills and diaphoresis.  HENT:  Positive for congestion and rhinorrhea. Negative for ear pain, sinus pressure, sinus pain and sore throat.   Respiratory:  Positive for cough. Negative for shortness of breath.   Cardiovascular:  Negative for chest pain.  Gastrointestinal:  Negative for abdominal pain, nausea and vomiting.   Musculoskeletal:  Negative for arthralgias and myalgias.  Skin:  Negative for rash.  Neurological:  Positive for headaches. Negative for weakness.  Hematological:  Negative for adenopathy.    Physical Exam Triage Vital Signs ED Triage Vitals  Enc Vitals Group     BP 03/20/21 0834 (!) 151/85     Pulse Rate 03/20/21 0834 (!) 58     Resp 03/20/21 0834 18     Temp 03/20/21 0834 99.6 F (37.6 C)     Temp Source 03/20/21 0834 Oral     SpO2 03/20/21 0834 96 %     Weight 03/20/21 0835 213 lb (96.6 kg)     Height 03/20/21 0835 5\' 5"  (1.651 m)     Head Circumference --      Peak Flow --  Pain Score 03/20/21 0835 7     Pain Loc --      Pain Edu? --      Excl. in [place]? --    No data found.  Updated Vital Signs BP (!) 151/85 (BP Location: Left Arm)   Pulse (!) 58   Temp 99.6 F (37.6 C) (Oral)   Resp 18   Ht 5\' 5"  (1.651 m)   Wt 213 lb (96.6 kg)   SpO2 96%   BMI 35.45 kg/m      Physical Exam Vitals and nursing note reviewed.  Constitutional:      General: She is not in acute distress.    Appearance: Normal appearance. She is ill-appearing. She is not toxic-appearing.  HENT:     Head: Normocephalic and atraumatic.     Right Ear: Tympanic membrane, ear canal and external ear normal.     Left Ear: Tympanic membrane, ear canal and external ear normal.     Nose: Congestion present.     Mouth/Throat:     Mouth: Mucous membranes are moist.     Pharynx: Oropharynx is clear.  Eyes:     General: No scleral icterus.       Right eye: No discharge.        Left eye: No discharge.     Conjunctiva/sclera: Conjunctivae normal.  Cardiovascular:     Rate and Rhythm: Normal rate and regular rhythm.     Heart sounds: Normal heart sounds.  Pulmonary:     Effort: Pulmonary effort is normal. No respiratory distress.     Breath sounds: Normal breath sounds.  Musculoskeletal:     Cervical back: Neck supple.  Skin:    General: Skin is dry.  Neurological:     General: No focal  deficit present.     Mental Status: She is alert. Mental status is at baseline.     Motor: No weakness.     Gait: Gait normal.  Psychiatric:        Mood and Affect: Mood normal.        Behavior: Behavior normal.        Thought Content: Thought content normal.     UC Treatments / Results  Labs (all labs ordered are listed, but only abnormal results are displayed) Labs Reviewed  RESP PANEL BY RT-PCR (FLU A&B, COVID) ARPGX2 - Abnormal; Notable for the following components:      Result Value   SARS Coronavirus 2 by RT PCR POSITIVE (*)    All other components within normal limits    EKG   Radiology No results found.  Procedures Procedures (including critical care time)  Medications Ordered in UC Medications - No data to display  Initial Impression / Assessment and Plan / UC Course  I have reviewed the triage vital signs and the nursing notes.  Pertinent labs & imaging results that were available during my care of the patient were reviewed by me and considered in my medical decision making (see chart for details).  70 year old female presenting for 2-day history of fever, fatigue, cough, congestion and headaches.  BP is elevated 151/85.  She is afebrile but has taken antipyretics.  Patient is mildly ill-appearing but nontoxic.  Has nasal congestion on exam.  Chest is clear to auscultation heart regular rhythm.  No respiratory distress.   Respiratory panel obtained. +COVID 19.  Discussed results with patient.  Reviewed current CDC guidelines, isolation protocol and ED precautions.  Patient is candidate for antiviral medicine so I  sent Paxlovid to pharmacy as well as benzonatate.  Supportive care with increasing rest and fluids.  Over-the-counter Tylenol as needed for fever.  Also can take Coricidin HBP over-the-counter for cough.  Reviewed return and ER precautions.  Final Clinical Impressions(s) / UC Diagnoses   Final diagnoses:  COVID-19  Acute cough  Fever,  unspecified     Discharge Instructions      -You have COVID 19 -I have sent antiviral meds and cough meds -Isolate until Thurs. Then wear a mask x 5 days -Hold your statin for 1 week from now.  If symptomatic, go home and rest. Push fluids. Take Tylenol as needed for discomfort. Gargle warm salt water. Throat lozenges. Take Coricidin HBP for cough. Humidifier in bedroom to ease coughing. Warm showers. Also review the COVID handout for more information.  COVID-19 INFECTION: The incubation period of COVID-19 is approximately 14 days after exposure, with most symptoms developing in roughly 4-5 days. Symptoms may range in severity from mild to critically severe. Roughly 80% of those infected will have mild symptoms. People of any age may become infected with COVID-19 and have the ability to transmit the virus. The most common symptoms include: fever, fatigue, cough, body aches, headaches, sore throat, nasal congestion, shortness of breath, nausea, vomiting, diarrhea, changes in smell and/or taste.    COURSE OF ILLNESS Some patients may begin with mild disease which can progress quickly into critical symptoms. If your symptoms are worsening please call ahead to the Emergency Department and proceed there for further treatment. Recovery time appears to be roughly 1-2 weeks for mild symptoms and 3-6 weeks for severe disease.   GO IMMEDIATELY TO ER FOR FEVER YOU ARE UNABLE TO GET DOWN WITH TYLENOL, BREATHING PROBLEMS, CHEST PAIN, FATIGUE, LETHARGY, INABILITY TO EAT OR DRINK, ETC  QUARANTINE AND ISOLATION: To help decrease the spread of COVID-19 please remain isolated if you have COVID infection or are highly suspected to have COVID infection. This means -stay home and isolate to one room in the home if you live with others. Do not share a bed or bathroom with others while ill, sanitize and wipe down all countertops and keep common areas clean and disinfected. Stay home for 5 days. If you have no symptoms  or your symptoms are resolving after 5 days, you can leave your house. Continue to wear a mask around others for 5 additional days. If you have been in close contact (within 6 feet) of someone diagnosed with COVID 19, you are advised to quarantine in your home for 14 days as symptoms can develop anywhere from 2-14 days after exposure to the virus. If you develop symptoms, you  must isolate.  Most current guidelines for COVID after exposure -unvaccinated: isolate 5 days and strict mask use x 5 days. Test on day 5 is possible -vaccinated: wear mask x 10 days if symptoms do not develop -You do not necessarily need to be tested for COVID if you have + exposure and  develop symptoms. Just isolate at home x10 days from symptom onset During this global pandemic, CDC advises to practice social distancing, try to stay at least 33ft away from others at all times. Wear a face covering. Wash and sanitize your hands regularly and avoid going anywhere that is not necessary.  KEEP IN MIND THAT THE COVID TEST IS NOT 100% ACCURATE AND YOU SHOULD STILL DO EVERYTHING TO PREVENT POTENTIAL SPREAD OF VIRUS TO OTHERS (WEAR MASK, WEAR GLOVES, [place] HANDS AND SANITIZE REGULARLY). IF INITIAL  TEST IS NEGATIVE, THIS MAY NOT MEAN YOU ARE DEFINITELY NEGATIVE. MOST ACCURATE TESTING IS DONE 5-7 DAYS AFTER EXPOSURE.   It is not advised by CDC to get re-tested after receiving a positive COVID test since you can still test positive for weeks to months after you have already cleared the virus.   *If you have not been vaccinated for COVID, I strongly suggest you consider getting vaccinated as long as there are no contraindications.       ED Prescriptions     Medication Sig Dispense Auth. Provider   benzonatate (TESSALON) 200 MG capsule Take 1 capsule (200 mg total) by mouth 3 (three) times daily as needed for cough. 30 capsule Laurene Footman B, PA-C   nirmatrelvir & ritonavir (PAXLOVID, 150/100,) 10 x 150 MG & 10 x 100MG  TBPK Take 2  tablets by mouth 2 (two) times daily for 5 days. 20 tablet Gretta Cool      PDMP not reviewed this encounter.   Danton Clap, PA-C 03/20/21 [phone]

## 2021-03-20 NOTE — ED Triage Notes (Signed)
Pt c/o congestion, headache, cough x 2-3days. Pt states that she felt better yesterday but congestion worsened over the night. Pt has not taken any tylenol today. Pt asks to be tested for covid and flu.

## 2021-03-20 NOTE — Discharge Instructions (Addendum)
-You have COVID 19 -I have sent antiviral meds and cough meds -Isolate until Thurs. Then wear a mask x 5 days -Hold your statin for 1 week from now.  If symptomatic, go home and rest. Push fluids. Take Tylenol as needed for discomfort. Gargle warm salt water. Throat lozenges. Take Coricidin HBP for cough. Humidifier in bedroom to ease coughing. Warm showers. Also review the COVID handout for more information.  COVID-19 INFECTION: The incubation period of COVID-19 is approximately 14 days after exposure, with most symptoms developing in roughly 4-5 days. Symptoms may range in severity from mild to critically severe. Roughly 80% of those infected will have mild symptoms. People of any age may become infected with COVID-19 and have the ability to transmit the virus. The most common symptoms include: fever, fatigue, cough, body aches, headaches, sore throat, nasal congestion, shortness of breath, nausea, vomiting, diarrhea, changes in smell and/or taste.    COURSE OF ILLNESS Some patients may begin with mild disease which can progress quickly into critical symptoms. If your symptoms are worsening please call ahead to the Emergency Department and proceed there for further treatment. Recovery time appears to be roughly 1-2 weeks for mild symptoms and 3-6 weeks for severe disease.   GO IMMEDIATELY TO ER FOR FEVER YOU ARE UNABLE TO GET DOWN WITH TYLENOL, BREATHING PROBLEMS, CHEST PAIN, FATIGUE, LETHARGY, INABILITY TO EAT OR DRINK, ETC  QUARANTINE AND ISOLATION: To help decrease the spread of COVID-19 please remain isolated if you have COVID infection or are highly suspected to have COVID infection. This means -stay home and isolate to one room in the home if you live with others. Do not share a bed or bathroom with others while ill, sanitize and wipe down all countertops and keep common areas clean and disinfected. Stay home for 5 days. If you have no symptoms or your symptoms are resolving after 5 days, you  can leave your house. Continue to wear a mask around others for 5 additional days. If you have been in close contact (within 6 feet) of someone diagnosed with COVID 19, you are advised to quarantine in your home for 14 days as symptoms can develop anywhere from 2-14 days after exposure to the virus. If you develop symptoms, you  must isolate.  Most current guidelines for COVID after exposure -unvaccinated: isolate 5 days and strict mask use x 5 days. Test on day 5 is possible -vaccinated: wear mask x 10 days if symptoms do not develop -You do not necessarily need to be tested for COVID if you have + exposure and  develop symptoms. Just isolate at home x10 days from symptom onset During this global pandemic, CDC advises to practice social distancing, try to stay at least 47ft away from others at all times. Wear a face covering. Wash and sanitize your hands regularly and avoid going anywhere that is not necessary.  KEEP IN MIND THAT THE COVID TEST IS NOT 100% ACCURATE AND YOU SHOULD STILL DO EVERYTHING TO PREVENT POTENTIAL SPREAD OF VIRUS TO OTHERS (WEAR MASK, WEAR GLOVES, [place] HANDS AND SANITIZE REGULARLY). IF INITIAL TEST IS NEGATIVE, THIS MAY NOT MEAN YOU ARE DEFINITELY NEGATIVE. MOST ACCURATE TESTING IS DONE 5-7 DAYS AFTER EXPOSURE.   It is not advised by CDC to get re-tested after receiving a positive COVID test since you can still test positive for weeks to months after you have already cleared the virus.   *If you have not been vaccinated for COVID, I strongly suggest you consider  getting vaccinated as long as there are no contraindications.

## 2021-05-19 ENCOUNTER — Encounter: Payer: Self-pay | Admitting: Ophthalmology

## 2021-05-19 ENCOUNTER — Other Ambulatory Visit: Payer: Self-pay

## 2021-05-25 NOTE — Discharge Instructions (Signed)

## 2021-05-27 ENCOUNTER — Ambulatory Visit
Admission: RE | Admit: 2021-05-27 | Discharge: 2021-05-27 | Disposition: A | Discharge status: Home / Self Care | Payer: Medicare Other | Attending: Ophthalmology | Admitting: Ophthalmology

## 2021-05-27 ENCOUNTER — Encounter
Admission: RE | Disposition: A | Discharge status: Home / Self Care | Payer: Self-pay | Source: Home / Self Care | Attending: Ophthalmology

## 2021-05-27 ENCOUNTER — Ambulatory Visit: Payer: Medicare Other | Admitting: Anesthesiology

## 2021-05-27 ENCOUNTER — Other Ambulatory Visit: Payer: Self-pay

## 2021-05-27 ENCOUNTER — Encounter: Payer: Self-pay | Admitting: Ophthalmology

## 2021-05-27 DIAGNOSIS — H02834 Dermatochalasis of left upper eyelid: Secondary | ICD-10-CM | POA: Insufficient documentation

## 2021-05-27 DIAGNOSIS — I10 Essential (primary) hypertension: Secondary | ICD-10-CM | POA: Insufficient documentation

## 2021-05-27 DIAGNOSIS — E785 Hyperlipidemia, unspecified: Secondary | ICD-10-CM | POA: Insufficient documentation

## 2021-05-27 DIAGNOSIS — H57813 Brow ptosis, bilateral: Secondary | ICD-10-CM | POA: Insufficient documentation

## 2021-05-27 DIAGNOSIS — Z87891 Personal history of nicotine dependence: Secondary | ICD-10-CM | POA: Insufficient documentation

## 2021-05-27 DIAGNOSIS — H02831 Dermatochalasis of right upper eyelid: Secondary | ICD-10-CM | POA: Diagnosis not present

## 2021-05-27 DIAGNOSIS — G4730 Sleep apnea, unspecified: Secondary | ICD-10-CM | POA: Insufficient documentation

## 2021-05-27 DIAGNOSIS — Z79899 Other long term (current) drug therapy: Secondary | ICD-10-CM | POA: Insufficient documentation

## 2021-05-27 HISTORY — PX: BROW LIFT: SHX178

## 2021-05-27 SURGERY — BLEPHAROPLASTY
Anesthesia: General | Laterality: Bilateral

## 2021-05-27 MED ORDER — ERYTHROMYCIN 5 MG/GM OP OINT
TOPICAL_OINTMENT | OPHTHALMIC | Status: DC | PRN
  Administered 2021-05-27: 1 via OPHTHALMIC

## 2021-05-27 MED ORDER — OXYCODONE HCL 5 MG/5ML PO SOLN: 5.0000 mg | Freq: Once | ORAL | Status: DC | PRN

## 2021-05-27 MED ORDER — TETRACAINE HCL 0.5 % OP SOLN
OPHTHALMIC | Status: DC | PRN
  Administered 2021-05-27: 1 [drp] via OPHTHALMIC

## 2021-05-27 MED ORDER — TRAMADOL HCL 50 MG PO TABS: ORAL_TABLET | ORAL | 0 refills | Status: DC

## 2021-05-27 MED ORDER — DIPHENHYDRAMINE HCL 50 MG/ML IJ SOLN
INTRAMUSCULAR | Status: DC | PRN
  Administered 2021-05-27: 6.25 mg via INTRAVENOUS

## 2021-05-27 MED ORDER — ACETAMINOPHEN 325 MG PO TABS
650.0000 mg | ORAL_TABLET | Freq: Four times a day (QID) | ORAL | Status: DC | PRN
  Administered 2021-05-27: 650 mg via ORAL

## 2021-05-27 MED ORDER — LACTATED RINGERS IV SOLN: INTRAVENOUS | Status: DC

## 2021-05-27 MED ORDER — ERYTHROMYCIN 5 MG/GM OP OINT: TOPICAL_OINTMENT | OPHTHALMIC | 2 refills | Status: DC

## 2021-05-27 MED ORDER — OXYCODONE HCL 5 MG PO TABS: 5.0000 mg | ORAL_TABLET | Freq: Once | ORAL | Status: DC | PRN

## 2021-05-27 MED ORDER — PROMETHAZINE HCL 25 MG/ML IJ SOLN: 6.2500 mg | INTRAMUSCULAR | Status: DC | PRN

## 2021-05-27 MED ORDER — HYDROMORPHONE HCL 1 MG/ML IJ SOLN: 0.2500 mg | INTRAMUSCULAR | Status: DC | PRN

## 2021-05-27 MED ORDER — PROPOFOL 500 MG/50ML IV EMUL
INTRAVENOUS | Status: DC | PRN
  Administered 2021-05-27: 25 ug/kg/min via INTRAVENOUS

## 2021-05-27 MED ORDER — LIDOCAINE-EPINEPHRINE 2 %-1:100000 IJ SOLN
INTRAMUSCULAR | Status: DC | PRN
  Administered 2021-05-27: 7 mL via OPHTHALMIC
  Administered 2021-05-27: 2 mL via OPHTHALMIC

## 2021-05-27 MED ORDER — ALFENTANIL 500 MCG/ML IJ INJ
INJECTION | INTRAVENOUS | Status: DC | PRN
  Administered 2021-05-27: 300 ug via INTRAVENOUS
  Administered 2021-05-27: 400 ug via INTRAVENOUS
  Administered 2021-05-27: 300 ug via INTRAVENOUS

## 2021-05-27 MED ORDER — BSS IO SOLN
INTRAOCULAR | Status: DC | PRN
  Administered 2021-05-27: 15 mL

## 2021-05-27 MED ORDER — MIDAZOLAM HCL 2 MG/2ML IJ SOLN
INTRAMUSCULAR | Status: DC | PRN
Start: 2021-05-27 — End: 2021-05-27
  Administered 2021-05-27 (×2): 1 mg via INTRAVENOUS

## 2021-05-27 SURGICAL SUPPLY — 36 items
APPLICATOR COTTON TIP WD 3 STR (MISCELLANEOUS) ×2 IMPLANT
BLADE SURG 15 STRL LF DISP TIS (BLADE) ×1 IMPLANT
BLADE SURG 15 STRL SS (BLADE) ×2
CORD BIP STRL DISP 12FT (MISCELLANEOUS) ×2 IMPLANT
GAUZE SPONGE 4X4 12PLY STRL (GAUZE/BANDAGES/DRESSINGS) ×2 IMPLANT
GLOVE SURG UNDER POLY LF SZ7 (GLOVE) ×4 IMPLANT
GOWN STRL REUS W/ TWL LRG LVL3 (GOWN DISPOSABLE) ×1 IMPLANT
GOWN STRL REUS W/TWL LRG LVL3 (GOWN DISPOSABLE) ×2
MARKER SKIN XFINE TIP W/RULER (MISCELLANEOUS) ×2 IMPLANT
NDL FILTER BLUNT 18X1 1/2 (NEEDLE) ×1 IMPLANT
NDL HYPO 30X.5 LL (NEEDLE) ×2 IMPLANT
NEEDLE FILTER BLUNT 18X 1/2SAF (NEEDLE) ×1
NEEDLE FILTER BLUNT 18X1 1/2 (NEEDLE) ×1 IMPLANT
NEEDLE HYPO 30X.5 LL (NEEDLE) ×4 IMPLANT
PACK ENT CUSTOM (PACKS) ×2 IMPLANT
SOL PREP PVP 2OZ (MISCELLANEOUS) ×2
SOLUTION PREP PVP 2OZ (MISCELLANEOUS) ×1 IMPLANT
SPONGE GAUZE 2X2 8PLY STRL LF (GAUZE/BANDAGES/DRESSINGS) ×20 IMPLANT
SUT CHROMIC 4-0 (SUTURE)
SUT CHROMIC 4-0 M2 12X2 ARM (SUTURE)
SUT CHROMIC 5 0 P 3 (SUTURE) ×2 IMPLANT
SUT ETHILON 4 0 CL P 3 (SUTURE) IMPLANT
SUT GUT PLAIN 6-0 1X18 ABS (SUTURE) ×2 IMPLANT
SUT MERSILENE 4-0 S-2 (SUTURE) IMPLANT
SUT PROLENE 5 0 P 3 (SUTURE) ×2 IMPLANT
SUT PROLENE 6 0 P 1 18 (SUTURE) IMPLANT
SUT SILK 4 0 G 3 (SUTURE) IMPLANT
SUT VIC AB 5-0 P-3 18X BRD (SUTURE) IMPLANT
SUT VIC AB 5-0 P3 18 (SUTURE)
SUT VICRYL 6-0  S14 CTD (SUTURE)
SUT VICRYL 6-0 S14 CTD (SUTURE) IMPLANT
SUT VICRYL 7 0 TG140 8 (SUTURE) IMPLANT
SUTURE CHRMC 4-0 M2 12X2 ARM (SUTURE) IMPLANT
SYR 10ML LL (SYRINGE) ×2 IMPLANT
SYR 3ML LL SCALE MARK (SYRINGE) ×2 IMPLANT
WATER STERILE IRR 250ML POUR (IV SOLUTION) ×2 IMPLANT

## 2021-05-27 NOTE — Interval H&P Note (Signed)
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  The various methods of treatment have been discussed with the patient and family. After consideration of risks, benefits and other options for treatment, the patient has consented to  Procedure(s): BLEPHAROPLASTY UPPER EYELID; W/EXCESS SKIN AND BROW PTOSIS REPAIR BILATERAL (Bilateral) as a surgical intervention.  The patient's history has been reviewed, patient examined, no change in status, stable for surgery.  I have reviewed the patient's chart and labs.  Questions were answered to the patient's satisfaction.     Vickki Muff, Kelsey Mendoza

## 2021-05-27 NOTE — Op Note (Signed)
Preoperative Diagnosis:   1.  Visually significant bilateral brow ptosis.  2.  Visually significant dermatochalasis bilateral Upper Eyelid(s)  Postoperative Diagnosis: Same.   Procedure(s) Performed:   1. bilateral  Direct brow lift to improve vision.  2.  Upper eyelid blepharoplasty with excess skin excision bilateral  Upper Eyelid(s)  Surgeon: Philis Pique. Vickki Muff, M.D.   Assistants: None   Anesthesia: MAC  Specimens: None.  Estimated Blood Loss: Minimal.  Complications: None.  Operative Findings: None Dictated  PROCEDURE:   Allergies were reviewed and the patient is allergic to trazodone..   After the risks, benefits, complications and alternatives were discussed with the patient, appropriate informed consent was obtained. While seated in an upright position and looking in primary gaze, the amount of supra-brow skin to be removed was measured and marked in an elliptical pattern. The patient was then brought to the operating suite and reclined supine.  Timeout was conducted and the patient was sedated. Local anesthetic consisting of a 50-50 mixture of 2% lidocaine with epinephrine and 0.75% bupivacaine with added Hylenex was injected subcutaneously to the bilateral  brow region(s) and down to the periosteum and  subcutaneously to both  upper eyelid(s). After adequate local was instilled, the patient was prepped and draped in the usual sterile fashion for eyelid surgery.   Attention was turned to the right brow region. A #15 blade was used to create a bevelled incision along the premarked incision line. A skin and subcutaneous tissue flap was then excised and hemostasis was obtained with bipolar cautery. The deep tissues were reapproximated with interrupted vertical 5-0 chromic sutures. The skin margin was reapproximated with a running locking 5-0 Prolene suture. Attention was then turned to the opposite brow region where the same procedure was performed in the same manner.    Attention  was then turned to the upper eyelids. A 74m upper eyelid crease incision line was marked with calipers on both  upper eyelid(s).  A pinch test was used to estimate the amount of excess skin to remove and this was marked in standard blepharoplasty style fashion. Attention was turned to the right  upper eyelid. A #15 blade was used to open the premarked incision line. A Skin and muscle flap was excised and hemostasis was obtained with bipolar cautery. A buttonhole was created medially in orbicularis and orbital septum to reveal the medial fat pocket. This was dissected free from fascial attachments, cauterized towards the pedicle base and excised to produce a nice flattening of the medial corner of the upper eyelid.  A strip of ROOF fat was excised to help further debulk the lateral upper eyelid.  Attention was then turned to the opposite eyelid where the same procedure was performed in the same manner.   The skin incisions were closed with a combination of interrupted and  running 6-0 fast absorbing plain gut suture.   The patient tolerated the procedure well. Erythromycin ophthalmic ointment was applied to the incision site(s) followed by ice packs.The patient was taken to the recovery area where she recovered without difficulty.  Post-Op Plan/Instructions:  The patient was instructed to use ice packs frequently for the next 48 hours.  she was instructed to use Erythromycin ophthalmic ophthalmic ointment on the eyelid incisions and over-the-counter antibiotic ointment on the brow sutures 4 times a day for the next 12 to 14 days. she was given a prescription for Tramadol (or similar) for pain control should Tylenol not be effective. she was asked to to follow up in  10-12 days time for suture removal or sooner as needed for problems.   Kelsey Mendoza M. Vickki Muff, M.D. Ophthalmology

## 2021-05-27 NOTE — Anesthesia Preprocedure Evaluation (Addendum)
Anesthesia Evaluation  Patient identified by MRN, date of birth, ID band Patient awake    Reviewed: Allergy & Precautions, H&P , NPO status , Patient's Chart, lab work & pertinent test results, reviewed documented beta blocker date and time   Airway Mallampati: II  TM Distance: >3 FB Neck ROM: full   Comment: Dentures Dental no notable dental hx.    Pulmonary neg pulmonary ROS, sleep apnea and Continuous Positive Airway Pressure Ventilation , former smoker,    Pulmonary exam normal breath sounds clear to auscultation       Cardiovascular Exercise Tolerance: Good hypertension, negative cardio ROS   Rhythm:regular Rate:Normal  HLD   Neuro/Psych negative neurological ROS  negative psych ROS   GI/Hepatic negative GI ROS, Neg liver ROS,   Endo/Other  negative endocrine ROSdiabetes (pre-DM)  Renal/GU negative Renal ROS  negative genitourinary   Musculoskeletal  (+) Arthritis ,   Abdominal   Peds  Hematology negative hematology ROS (+) Blood dyscrasia, anemia ,   Anesthesia Other Findings   Reproductive/Obstetrics negative OB ROS                            Anesthesia Physical Anesthesia Plan  ASA: 2  Anesthesia Plan: General   Post-op Pain Management:    Induction:   PONV Risk Score and Plan: 2 and Propofol infusion, TIVA and Treatment may vary due to age or medical condition  Airway Management Planned:   Additional Equipment:   Intra-op Plan:   Post-operative Plan:   Informed Consent: I have reviewed the patients History and Physical, chart, labs and discussed the procedure including the risks, benefits and alternatives for the proposed anesthesia with the patient or authorized representative who has indicated his/her understanding and acceptance.     Dental Advisory Given  Plan Discussed with: CRNA  Anesthesia Plan Comments:         Anesthesia Quick Evaluation

## 2021-05-27 NOTE — Anesthesia Postprocedure Evaluation (Signed)
Anesthesia Post Note  Patient: Kelsey Mendoza  Procedure(s) Performed: BLEPHAROPLASTY UPPER EYELID; W/EXCESS SKIN AND BROW PTOSIS REPAIR BILATERAL (Bilateral)     Patient location during evaluation: PACU Anesthesia Type: General Level of consciousness: awake and alert Pain management: pain level controlled Vital Signs Assessment: post-procedure vital signs reviewed and stable Respiratory status: spontaneous breathing, nonlabored ventilation and respiratory function stable Cardiovascular status: blood pressure returned to baseline and stable Postop Assessment: no apparent nausea or vomiting Anesthetic complications: no   No notable events documented.  April Manson

## 2021-05-27 NOTE — Anesthesia Procedure Notes (Signed)
Date/Time: 05/27/2021 8:46 AM Performed by: Cameron Ali, CRNA Pre-anesthesia Checklist: Patient identified, Emergency Drugs available, Suction available, Timeout performed and Patient being monitored Patient Re-evaluated:Patient Re-evaluated prior to induction Oxygen Delivery Method: Nasal cannula Placement Confirmation: positive ETCO2

## 2021-05-27 NOTE — Transfer of Care (Signed)
Immediate Anesthesia Transfer of Care Note  Patient: CIERRIA HEIGHT  Procedure(s) Performed: BLEPHAROPLASTY UPPER EYELID; W/EXCESS SKIN AND BROW PTOSIS REPAIR BILATERAL (Bilateral)  Patient Location: PACU  Anesthesia Type: General  Level of Consciousness: awake, alert  and patient cooperative  Airway and Oxygen Therapy: Patient Spontanous Breathing and Patient connected to supplemental oxygen  Post-op Assessment: Post-op Vital signs reviewed, Patient's Cardiovascular Status Stable, Respiratory Function Stable, Patent Airway and No signs of Nausea or vomiting  Post-op Vital Signs: Reviewed and stable  Complications: No notable events documented.

## 2021-05-27 NOTE — H&P (Signed)
[place]: [facility]  Primary Care Physician:  [name], MD Ophthalmologist: Dr. Philis Pique. Vickki Muff, M.D.  Pre-Procedure History & Physical: HPI:  Kelsey Mendoza is a 71 y.o. female here for periocular surgery.   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 [name], Vit, Mem Rito Ehrlich   I & D EXTREMITY Right 06/06/2017   Procedure: IRRIGATION AND DEBRIDEMENT EXTREMITY--RIGHT ARM;  Surgeon: [name], MD;  Location: [facility];  Service: Orthopedics;  Laterality: Right;   KNEE ARTHROSCOPY Left 10/06/2019   Procedure: ARTHROSCOPY KNEE,PARTIAL MEDIAL MENISECTOMY,PARTIAL CHONDROPLASTY;  Surgeon: [name], MD;  Location: [facility];  Service: Orthopedics;  Laterality: Left;   KNEE SURGERY     OPEN REDUCTION INTERNAL FIXATION (ORIF) DISTAL RADIAL FRACTURE N/A 06/06/2017   Procedure: OPEN REDUCTION INTERNAL FIXATION (ORIF) DISTAL RADIAL FRACTURE;  Surgeon: [name], MD;  Location: [facility];  Service: Orthopedics;  Laterality: N/A;    Prior to Admission medications   Medication Sig Start Date End Date Taking? Authorizing Provider  amLODipine (NORVASC) 5 MG tablet Take 1 tablet (5 mg total) by mouth daily. 08/16/16 05/27/21 Yes Merlyn Lot, MD  gabapentin (NEURONTIN) 100 MG capsule Take 100 mg by mouth at bedtime.   Yes [provider]  losartan (COZAAR) 100 MG tablet Take 100 mg by mouth every evening.    Yes [provider]   rosuvastatin (CRESTOR) 5 MG tablet Take 5 mg by mouth at bedtime.    Yes [provider]  triamterene-hydrochlorothiazide (MAXZIDE-25) 37.5-25 MG tablet Take 1 tablet by mouth daily.   Yes [provider]  furosemide (LASIX) 40 MG tablet Take 40 mg by mouth daily as needed for fluid.  Patient not taking: Reported on 05/19/2021    [provider]  loratadine (CLARITIN) 10 MG tablet Take 10 mg by mouth daily as needed for allergies. Patient not taking: Reported on 05/19/2021    [provider]  prednisoLONE acetate (PRED FORTE) 1 % ophthalmic suspension Place 1 drop into both eyes See admin instructions. Instill 1 drop into the right eye once daily for 4 days (should finish right eye on 6/8). Instill 1 drop into the left eye 3 times daily for 4 days, then twice daily for 7 days (starting on 6/9), then once daily for 7 days Patient not taking: Reported on 02/12/2020    [provider]  prednisoLONE acetate (PRED FORTE) 1 % ophthalmic suspension Place 1 drop into the left eye 4 (four) times daily. Patient not taking: Reported on 02/12/2020 11/19/19   Rankin, Clent Demark, MD  urea (CARMOL) 20 % cream Apply 1 application topically daily as needed (dry skin).     [provider]    Allergies as of 03/07/2021 - Review Complete 08/29/2020  Allergen Reaction Noted   Trazodone Other (See Comments) 07/22/2013    Family History  Problem Relation Age of Onset   Hypertension Mother    Hypertension Father    CAD Father    Breast cancer Neg Hx     Social History   Socioeconomic History   Marital status: Widowed    Spouse name: Not on file   Number of children: Not on file   Years of education: Not on file   Highest education level: Not on file  Occupational History   Not on file  Tobacco Use   Smoking status: Former    Types: Cigarettes    Quit date: 03/01/1983    Years since quitting: 38.2   Smokeless tobacco: Never  Vaping Use   Vaping Use:  Never used  Substance and Sexual Activity   Alcohol use: No   Drug use: No   Sexual activity: Not on file  Other Topics Concern   Not on file  Social History Narrative   Not on file   Social Determinants of Health   Financial Resource Strain: Not on file  Food Insecurity: Not on file  Transportation Needs: Not on file  Physical Activity: Not on file  Stress: Not on file  Social Connections: Not on file  Intimate Partner Violence: Not on file    Review of Systems: See HPI, otherwise negative ROS  Physical Exam: BP 127/72    Pulse 62    Temp 98.2 F (36.8 C) (Temporal)    Ht 5\' 5"  (1.651 m)    Wt 99.5 kg    SpO2 98%    BMI 36.49 kg/m  General:   Alert and cooperative in NAD Head:  Normocephalic and atraumatic. Respiratory:  Normal work of breathing.  Impression/Plan: Kelsey Mendoza is here for periocular surgery.  Risks, benefits, limitations, and alternatives regarding surgery have been reviewed with the patient.  Questions have been answered.  All parties agreeable.   Karle Starch, MD  05/27/2021, 7:56 AM

## 2021-05-30 ENCOUNTER — Encounter: Payer: Self-pay | Admitting: Ophthalmology

## 2021-06-06 ENCOUNTER — Encounter (INDEPENDENT_AMBULATORY_CARE_PROVIDER_SITE_OTHER): Payer: Medicare Other | Admitting: Ophthalmology

## 2021-06-13 ENCOUNTER — Ambulatory Visit (INDEPENDENT_AMBULATORY_CARE_PROVIDER_SITE_OTHER): Payer: Medicare Other | Admitting: Ophthalmology

## 2021-06-13 ENCOUNTER — Other Ambulatory Visit: Payer: Self-pay

## 2021-06-13 ENCOUNTER — Encounter (INDEPENDENT_AMBULATORY_CARE_PROVIDER_SITE_OTHER): Payer: Self-pay | Admitting: Ophthalmology

## 2021-06-13 DIAGNOSIS — H40003 Preglaucoma, unspecified, bilateral: Secondary | ICD-10-CM

## 2021-06-13 DIAGNOSIS — E119 Type 2 diabetes mellitus without complications: Secondary | ICD-10-CM

## 2021-06-13 DIAGNOSIS — H35372 Puckering of macula, left eye: Secondary | ICD-10-CM | POA: Diagnosis not present

## 2021-06-13 DIAGNOSIS — Z9989 Dependence on other enabling machines and devices: Secondary | ICD-10-CM

## 2021-06-13 DIAGNOSIS — G4733 Obstructive sleep apnea (adult) (pediatric): Secondary | ICD-10-CM

## 2021-06-13 DIAGNOSIS — H35371 Puckering of macula, right eye: Secondary | ICD-10-CM | POA: Diagnosis not present

## 2021-06-13 NOTE — Progress Notes (Signed)
06/13/2021     CHIEF COMPLAINT Patient presents for  Chief Complaint  Patient presents with   Epiretinal Membrane/preretinal Fibrosis/cellophane Maculopathy      HISTORY OF PRESENT ILLNESS: Kelsey Mendoza is a 71 y.o. female who presents to the clinic today for:   HPI   1 yr fu oct fp Pt states had surgery for upper eye lid on feb 17th with Dr. Vickki Muff in [place]. I saw her past Thursday and because im still swollen, I still have one more pill to take. I believe it is prenisdone."  Pt states "I have a floater in OD."  Last edited by Silvestre Moment on 06/13/2021  9:17 AM.      Referring physician: [name], MD [address] [place],  [place] [address]  HISTORICAL INFORMATION:   Selected notes from the MEDICAL RECORD NUMBER       CURRENT MEDICATIONS: Current Outpatient Medications (Ophthalmic Drugs)  Medication Sig   erythromycin ophthalmic ointment Apply to sutures 4 times a day for 10-12 days.  Discontinue if allergy develops and call our office   No current facility-administered medications for this visit. (Ophthalmic Drugs)   Current Outpatient Medications (Other)  Medication Sig   amLODipine (NORVASC) 5 MG tablet Take 1 tablet (5 mg total) by mouth daily.   furosemide (LASIX) 40 MG tablet Take 40 mg by mouth daily as needed for fluid.  (Patient not taking: Reported on 05/19/2021)   gabapentin (NEURONTIN) 100 MG capsule Take 100 mg by mouth at bedtime.   loratadine (CLARITIN) 10 MG tablet Take 10 mg by mouth daily as needed for allergies. (Patient not taking: Reported on 05/19/2021)   losartan (COZAAR) 100 MG tablet Take 100 mg by mouth every evening.    rosuvastatin (CRESTOR) 5 MG tablet Take 5 mg by mouth at bedtime.    traMADol (ULTRAM) 50 MG tablet Take 1 every 4-6 hours as needed for pain not controlled by Tylenol   triamterene-hydrochlorothiazide (MAXZIDE-25) 37.5-25 MG tablet Take 1 tablet by mouth daily.   No current facility-administered medications for this  visit. (Other)      REVIEW OF SYSTEMS: ROS   Negative for: Constitutional, Gastrointestinal, Neurological, Skin, Genitourinary, Musculoskeletal, HENT, Endocrine, Cardiovascular, Eyes, Respiratory, Psychiatric, Allergic/Imm, Heme/Lymph Last edited by Silvestre Moment on 06/13/2021  9:17 AM.       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   CPAP (continuous positive airway pressure) dependence    HLD (hyperlipidemia)    Hypertension    Knee pain    Pre-diabetes    Psoriasis of scalp    Sleep apnea    Past Surgical History:  Procedure Laterality Date   ABDOMINAL HYSTERECTOMY     BACK SURGERY     BLADDER SURGERY     BROW LIFT Bilateral 05/27/2021   Procedure: BLEPHAROPLASTY UPPER EYELID; W/EXCESS SKIN AND BROW PTOSIS REPAIR BILATERAL;  Surgeon: Karle Starch, MD;  Location: [facility];  Service: Ophthalmology;  Laterality: Bilateral;   CARPAL TUNNEL RELEASE Right 06/06/2017   Procedure: CARPAL TUNNEL RELEASE;  Surgeon: [name], MD;  Location: [facility];  Service: Orthopedics;  Laterality: Right;   EYE SURGERY Left 11/26/2019   Dr. [name], Vit, Mem [place]   I & D EXTREMITY Right 06/06/2017   Procedure: IRRIGATION AND DEBRIDEMENT EXTREMITY--RIGHT ARM;  Surgeon: [name], MD;  Location: [facility];  Service: Orthopedics;  Laterality: Right;   KNEE ARTHROSCOPY Left 10/06/2019   Procedure: ARTHROSCOPY KNEE,PARTIAL MEDIAL MENISECTOMY,PARTIAL CHONDROPLASTY;  Surgeon: [name], MD;  Location: [facility];  Service: Orthopedics;  Laterality: Left;   KNEE SURGERY     OPEN REDUCTION INTERNAL FIXATION (ORIF) DISTAL RADIAL FRACTURE N/A 06/06/2017   Procedure: OPEN REDUCTION INTERNAL FIXATION (ORIF) DISTAL RADIAL FRACTURE;  Surgeon: [name], MD;  Location: [facility];  Service: Orthopedics;  Laterality:  N/A;    FAMILY HISTORY Family History  Problem Relation Age of Onset   Hypertension Mother    Hypertension Father    CAD Father    Breast cancer Neg Hx     SOCIAL HISTORY Social History   Tobacco Use   Smoking status: Former    Types: Cigarettes    Quit date: 03/01/1983    Years since quitting: 38.3   Smokeless tobacco: Never  Vaping Use   Vaping Use: Never used  Substance Use Topics   Alcohol use: No   Drug use: No         OPHTHALMIC EXAM:  Base Eye Exam     Visual Acuity (ETDRS)       Right Left   Dist [place] 20/25 20/30   Dist ph [place] 20/20 20/25 -2         Tonometry (Tonopen, 9:23 AM)       Right Left   Pressure 16 15         Pupils       Dark Light Shape React APD   Right 4 3 Round Brisk None   Left 4 3 Round Brisk None         Neuro/Psych     Oriented x3: Yes   Mood/Affect: Normal         Dilation     Both eyes: 1.0% Mydriacyl, 2.5% Phenylephrine @ 9:21 AM           Slit Lamp and Fundus Exam     External Exam       Right Left   External Normal Normal         Slit Lamp Exam       Right Left   Lids/Lashes Normal Normal   Conjunctiva/Sclera 2+ Subconjunctival hemorrhage White and quiet   Cornea Clear Clear   Anterior Chamber Deep and quiet Deep and quiet   Iris Round and reactive Round and reactive   Lens Centered posterior chamber intraocular lens, 2+ Posterior capsular opacification Centered posterior chamber intraocular lens, 2+ Posterior capsular opacification centrally   Anterior Vitreous Normal Normal         Fundus Exam       Right Left   Posterior Vitreous Clear, vitrectomized Clear, vitrectomized   Disc mild pallor mild pallor   C/D Ratio 0.7 0.75   Macula less  topographic distortion  no exudates, no hemorrhage, no topographic distortions   Vessels Normal Normal   Periphery Normal Normal            IMAGING AND PROCEDURES  Imaging and Procedures for 06/13/21  OCT, Retina - OU - Both Eyes        Right Eye Quality was good. Scan locations included subfoveal. Central Foveal Thickness: 316. Findings include abnormal foveal contour.   Left Eye Quality was good. Scan locations included subfoveal. Central Foveal Thickness: 354. Progression has been stable. Findings include abnormal foveal contour.   Notes Central foveal thickness is largely  the same post vitrectomy membrane peel, OU some 2 years previous     Patient does continue on CPAP usage With excellent compliance  OS, preoperative central Thickness was 449 m and is now improved to 388 m postoperatively     Color Fundus Photography Optos - OU - Both Eyes       Right Eye Progression has been stable. Disc findings include increased cup to disc ratio, pallor. Macula : normal observations. Vessels : normal observations. Periphery : normal observations.   Left Eye Progression has been stable. Disc findings include increased cup to disc ratio. Macula : normal observations. Vessels : normal observations. Periphery : normal observations.   Notes OS and OD with enlarged cup-to-disc ratios although stable over some time.  Sloping of the temporal urograms in each eye suggestive of open-angle glaucoma perhaps low-tension, nonetheless patient is followed closely and has had visual fields in the past with Dr. Katy Apo             ASSESSMENT/PLAN:  Glaucoma suspect, bilateral OS and OD with enlarged cup-to-disc ratios although stable over some time.  Sloping of the temporal urograms in each eye suggestive of open-angle glaucoma perhaps low-tension, nonetheless patient is followed closely and has had visual fields in the past with Dr. Katy Apo  Right epiretinal membrane OD looks great with improved acuity  Left epiretinal membrane OS looks great with improved acuity  Diabetes mellitus without complication (HCC) No detectable diabetic retinopathy  OSA on CPAP Explained to the patient that this will help and  prevent optic nerve damage over time as long as sleep apnea is present     ICD-10-CM   1. Right epiretinal membrane  H35.371 OCT, Retina - OU - Both Eyes    Color Fundus Photography Optos - OU - Both Eyes    2. Glaucoma suspect, bilateral  H40.003     3. Left epiretinal membrane  H35.372     4. Diabetes mellitus without complication ([place])  O13.0     5. OSA on CPAP  G47.33    Z99.89       1.  OU, looks great post vitrectomy membrane peel some 2 years previous.  Excellent acuity  2.  OAG suspect continues patient follows up with Dr. Prudencio Burly in the coming months and has had peripheral vision test in the past  3.  Follow-up here as needed  Ophthalmic Meds Ordered this visit:  No orders of the defined types were placed in this encounter.      Return if symptoms worsen or fail to improve, for ,, And follow-up with Dr. Katy Apo as scheduled.  There are no Patient Instructions on file for this visit.   Explained the diagnoses, plan, and follow up with the patient and they expressed understanding.  Patient expressed understanding of the importance of proper follow up care.   Clent Demark Cesia Orf M.D. Diseases & Surgery of the Retina and Vitreous Retina & Diabetic [place] 06/13/21     Abbreviations: M myopia (nearsighted); A astigmatism; H hyperopia (farsighted); P presbyopia; Mrx spectacle prescription;  CTL contact lenses; OD right eye; OS left eye; OU both eyes  XT exotropia; ET esotropia; PEK punctate epithelial keratitis; PEE punctate epithelial erosions; DES dry eye syndrome; MGD meibomian gland dysfunction; ATs artificial tears; PFAT's preservative free artificial tears; [place] nuclear sclerotic cataract; PSC posterior subcapsular cataract; ERM epi-retinal membrane; PVD posterior vitreous detachment; RD retinal detachment; DM diabetes mellitus; DR diabetic retinopathy; NPDR non-proliferative diabetic retinopathy; PDR proliferative  diabetic retinopathy; CSME clinically  significant macular edema; DME diabetic macular edema; dbh dot blot hemorrhages; CWS cotton wool spot; POAG primary open angle glaucoma; C/D cup-to-disc ratio; HVF humphrey visual field; GVF goldmann visual field; OCT optical coherence tomography; IOP intraocular pressure; BRVO Branch retinal vein occlusion; CRVO central retinal vein occlusion; CRAO central retinal artery occlusion; BRAO branch retinal artery occlusion; RT retinal tear; SB scleral buckle; PPV pars plana vitrectomy; VH Vitreous hemorrhage; PRP panretinal laser photocoagulation; IVK intravitreal kenalog; VMT vitreomacular traction; MH Macular hole;  NVD neovascularization of the disc; NVE neovascularization elsewhere; AREDS age related eye disease study; ARMD age related macular degeneration; POAG primary open angle glaucoma; EBMD epithelial/anterior basement membrane dystrophy; ACIOL anterior chamber intraocular lens; IOL intraocular lens; PCIOL posterior chamber intraocular lens; Phaco/IOL phacoemulsification with intraocular lens placement; [place] photorefractive keratectomy; LASIK laser assisted in situ keratomileusis; HTN hypertension; DM diabetes mellitus; COPD chronic obstructive pulmonary disease

## 2021-06-13 NOTE — Assessment & Plan Note (Signed)
OD looks great with improved acuity ?

## 2021-06-13 NOTE — Assessment & Plan Note (Signed)
OS looks great with improved acuity ?

## 2021-06-13 NOTE — Assessment & Plan Note (Signed)
Explained to the patient that this will help and prevent optic nerve damage over time as long as sleep apnea is present ?

## 2021-06-13 NOTE — Assessment & Plan Note (Signed)
OS and OD with enlarged cup-to-disc ratios although stable over some time.  Sloping of the temporal urograms in each eye suggestive of open-angle glaucoma perhaps low-tension, nonetheless patient is followed closely and has had visual fields in the past with Dr. Katy Apo ?

## 2021-06-13 NOTE — Assessment & Plan Note (Signed)
No detectable diabetic retinopathy 

## 2021-06-27 ENCOUNTER — Emergency Department
Admission: EM | Admit: 2021-06-27 | Discharge: 2021-06-27 | Disposition: A | Discharge status: Home / Self Care | Payer: Medicare Other | Attending: Emergency Medicine | Admitting: Emergency Medicine

## 2021-06-27 ENCOUNTER — Other Ambulatory Visit: Payer: Self-pay

## 2021-06-27 ENCOUNTER — Encounter: Payer: Self-pay | Admitting: Emergency Medicine

## 2021-06-27 DIAGNOSIS — W5501XA Bitten by cat, initial encounter: Secondary | ICD-10-CM | POA: Insufficient documentation

## 2021-06-27 DIAGNOSIS — S60943A Unspecified superficial injury of left middle finger, initial encounter: Secondary | ICD-10-CM | POA: Diagnosis present

## 2021-06-27 DIAGNOSIS — S61253A Open bite of left middle finger without damage to nail, initial encounter: Secondary | ICD-10-CM | POA: Diagnosis not present

## 2021-06-27 DIAGNOSIS — Z23 Encounter for immunization: Secondary | ICD-10-CM | POA: Diagnosis not present

## 2021-06-27 DIAGNOSIS — Y92019 Unspecified place in single-family (private) house as the place of occurrence of the external cause: Secondary | ICD-10-CM | POA: Insufficient documentation

## 2021-06-27 MED ORDER — AMOXICILLIN-POT CLAVULANATE 875-125 MG PO TABS: 1.0000 | ORAL_TABLET | Freq: Two times a day (BID) | ORAL | 0 refills | Status: AC

## 2021-06-27 MED ORDER — TETANUS-DIPHTH-ACELL PERTUSSIS 5-2.5-18.5 LF-MCG/0.5 IM SUSY
0.5000 mL | PREFILLED_SYRINGE | Freq: Once | INTRAMUSCULAR | Status: AC
  Administered 2021-06-27: 0.5 mL via INTRAMUSCULAR
  Filled 2021-06-27: qty 0.5

## 2021-06-27 NOTE — Discharge Instructions (Signed)
Clean daily with mild soap and water and watch for any signs of infection.  Begin taking the Augmentin 875 twice daily until you are completely finished.  Continue watching for any signs of infection and follow-up with your primary care or return to the emergency department if any concerns.  Your tetanus was updated.  The animal control people have your cat and should they call you to tell you that you will need to have rabies injections you will need to come to the emergency department for your initial series of injections and then you can go to either [facility] urgent care or [place] urgent care for the remainder of your shots.  Animal control will advise you of the status of the cat. ?

## 2021-06-27 NOTE — ED Triage Notes (Signed)
Pt via POV from home. Pt was bitten and scratched by a stray cat on the L hand this AM. Pt called [facility] and was told that they do not take cat. L hand is red but no swelling noted. Pt is A&Ox4 and NAD.  ?

## 2021-06-27 NOTE — ED Provider Notes (Signed)
? ?[facility] ?Provider Note ? ? ? Event Date/Time  ? First MD Initiated Contact with Patient 06/27/21 1225   ?  (approximate) ? ? ?History  ? ?Animal Bite ? ? ?HPI ? ?Kelsey Mendoza is a 71 y.o. female presents to the ED with complaint of cat bite and scratches that happened this morning.  Patient states that there was a cat out with 2 barn cats that would not let her cats eat.  Patient tracked it and brought it into the sun room in her home.  Today she was scratched multiple times and had a single bite to her left third finger.  Currently she has the cat in a cage inside her car but reports that she called the animal shelter who states they do not take cats.  Last tetanus booster is unknown. ?  ? ? ?Physical Exam  ? ?Triage Vital Signs: ?ED Triage Vitals  ?Enc Vitals Group  ?   BP 06/27/21 1205 126/73  ?   Pulse Rate 06/27/21 1205 71  ?   Resp 06/27/21 1205 16  ?   Temp 06/27/21 1205 98.4 ?F (36.9 ?C)  ?   Temp Source 06/27/21 1205 Oral  ?   SpO2 06/27/21 1205 94 %  ?   Weight 06/27/21 1143 216 lb (98 kg)  ?   Height 06/27/21 1143 '5\' 5"'$  (1.651 m)  ?   Head Circumference --   ?   Peak Flow --   ?   Pain Score 06/27/21 1143 8  ?   Pain Loc --   ?   Pain Edu? --   ?   Excl. in [place]? --   ? ? ?Most recent vital signs: ?Vitals:  ? 06/27/21 1205 06/27/21 1325  ?BP: 126/73 128/71  ?Pulse: 71 76  ?Resp: 16 17  ?Temp: 98.4 ?F (36.9 ?C)   ?SpO2: 94% 99%  ? ? ? ?General: Awake, no distress.  ?CV:  Good peripheral perfusion.  Heart regular rate and rhythm ?Resp:  Normal effort.  Lungs are clear bilaterally. ?Abd:  No distention.  ?Other:  Left hand with multiple superficial scratches to the hand and distal forearm without active bleeding.  There is 1 single bite puncture on the left third digit lateral aspect.  No active bleeding there.  Range of motion is full with flexion and extension.  Capillary refills less than 3 seconds and motor sensory function intact. ? ? ?ED Results / Procedures / Treatments   ? ?Labs ?(all labs ordered are listed, but only abnormal results are displayed) ?Labs Reviewed - No data to display ? ? ? ?PROCEDURES: ? ?Critical Care performed:  ? ?Procedures ? ? ?MEDICATIONS ORDERED IN ED: ?Medications  ?Tdap (BOOSTRIX) injection 0.5 mL (0.5 mLs Intramuscular Given 06/27/21 1241)  ? ? ? ?IMPRESSION / MDM / ASSESSMENT AND PLAN / ED COURSE  ?I reviewed the triage vital signs and the nursing notes. ? ? ?Differential diagnosis includes, but is not limited to, cat scratches and cat bite.  Questionable need for prophylaxis for rabies. ? ?71 year old female presents to the ED after a stray cat bit her and scratched her in her home.  Patient was able to get the cat into a cage and has the cat in her car at this moment.  Animal control was called and came to pick up the cat to be tested for rabies.  We will do for rabies prophylaxis at this time and animal control will instruct patient as to  whether or not she needs to get the rabies vaccine.  Tetanus immunization was updated.  Patient was given a prescription for Augmentin 875 twice daily for 10 days and encouraged to clean daily with mild soap and water and watch for any signs of infection.  She is aware that she will need to come to the emergency department for initial rabies vaccine should the animal control officer call her. ? ? ? ? ?FINAL CLINICAL IMPRESSION(S) / ED DIAGNOSES  ? ?Final diagnoses:  ?Cat bite, initial encounter  ? ? ? ?Rx / DC Orders  ? ?ED Discharge Orders   ? ?      Ordered  ?  amoxicillin-clavulanate (AUGMENTIN) 875-125 MG tablet  2 times daily       ? 06/27/21 1319  ? ?  ?  ? ?  ? ? ? ?Note:  This document was prepared using Dragon voice recognition software and may include unintentional dictation errors. ?  ?Johnn Hai, PA-C ?06/27/21 1425 ? ?  ?Lavonia Drafts, MD ?06/27/21 1434 ? ?

## 2021-07-28 ENCOUNTER — Ambulatory Visit
Admission: EM | Admit: 2021-07-28 | Discharge: 2021-07-28 | Disposition: A | Discharge status: Home / Self Care | Payer: Medicare Other | Attending: Physician Assistant | Admitting: Physician Assistant

## 2021-07-28 DIAGNOSIS — R5383 Other fatigue: Secondary | ICD-10-CM | POA: Insufficient documentation

## 2021-07-28 DIAGNOSIS — R0609 Other forms of dyspnea: Secondary | ICD-10-CM | POA: Diagnosis present

## 2021-07-28 LAB — CBC WITH DIFFERENTIAL/PLATELET
Abs Immature Granulocytes: 0.02 10*3/uL (ref 0.00–0.07)
Basophils Absolute: 0.1 10*3/uL (ref 0.0–0.1)
Basophils Relative: 1 %
Eosinophils Absolute: 0.1 10*3/uL (ref 0.0–0.5)
Eosinophils Relative: 2 %
HCT: 41.2 % (ref 36.0–46.0)
Hemoglobin: 13.7 g/dL (ref 12.0–15.0)
Immature Granulocytes: 0 %
Lymphocytes Relative: 24 %
Lymphs Abs: 1.7 10*3/uL (ref 0.7–4.0)
MCH: 29.1 pg (ref 26.0–34.0)
MCHC: 33.3 g/dL (ref 30.0–36.0)
MCV: 87.5 fL (ref 80.0–100.0)
Monocytes Absolute: 0.6 10*3/uL (ref 0.1–1.0)
Monocytes Relative: 9 %
Neutro Abs: 4.7 10*3/uL (ref 1.7–7.7)
Neutrophils Relative %: 64 %
Platelets: 223 10*3/uL (ref 150–400)
RBC: 4.71 MIL/uL (ref 3.87–5.11)
RDW: 13.1 % (ref 11.5–15.5)
WBC: 7.3 10*3/uL (ref 4.0–10.5)
nRBC: 0 % (ref 0.0–0.2)

## 2021-07-28 LAB — TSH: TSH: 2.797 u[IU]/mL (ref 0.350–4.500)

## 2021-07-28 NOTE — Discharge Instructions (Addendum)
-  We performed an EKG today. No concerning findings.  ?- We have drawn some labs to include CBC, TSH, and Lyme disease titers.  We will call with any abnormal results.  Results will be on your MyChart.  If you are positive for Lyme disease, we will treat you. ?- If fever still having fatigue and breathlessness and labs are normal, you should follow-up with your PCP as you may need further work-up to include cardiac work-up and potentially more labs. ?- If your shortness of breath or fatigue acutely worsen or you develop chest pain, dizziness, feel faint or have a fall, severe headache, vision changes, you should call 911 or have someone take you to the ER for more emergent evaluation. ?-I did review your labs from last month.  Your A1c is elevated in the range that qualifies as diabetes.  If you have not talked to your primary care provider about this you should reach out to them and see what the plan is.  See if they want to start you on medication.  As we discussed, you can have fatigue related to diabetes especially if it is untreated. ?

## 2021-07-28 NOTE — ED Provider Notes (Signed)
?[facility] ? ? ? ?CSN: [id number] ?Arrival date & time: 07/28/21  1119 ? ? ?  ? ?History   ?Chief Complaint ?Chief Complaint  ?Patient presents with  ? Fatigue  ? Shortness of Breath  ? ? ?HPI ?Kelsey Mendoza is a 71 y.o. female presenting for vague complaints of fatigue and shortness of breath when she is walking.  Patient says symptoms improved a little when she sits and rests.  This has been ongoing for the past 3 weeks.  Patient says she feels just like she did when she had [facility] spotted fever and Lyme disease in 2017.  She would like to be tested for those today.  She says that she pulls ticks off her dogs all the time.  She says she has had a couple tick bites in the past week.  Denies any associated rashes, fever, joint pain.  No flulike symptoms.  Denies any chest pain, palpitations, wheezing.  No dizziness or syncope/presyncope.  Patient has history of obesity, hypertension, prediabetes, obstructive sleep apnea (wears CPAP), depression.  Patient's last physical was less than 1 month ago.  She denies any history of cardial pulmonary disease outside of sleep apnea.  No other complaints. ? ?HPI ? ?Past Medical History:  ?Diagnosis Date  ? Cancer [facility]) 2000s  ? bladder  ? CPAP (continuous positive airway pressure) dependence   ? HLD (hyperlipidemia)   ? Hypertension   ? Knee pain   ? Pre-diabetes   ? Psoriasis of scalp   ? Sleep apnea   ? ? ?Patient Active Problem List  ? Diagnosis Date Noted  ? OSA on CPAP 06/13/2021  ? Posterior capsular opacification, right 03/25/2020  ? Postoperative follow-up 03/18/2020  ? Left posterior capsular opacification 02/12/2020  ? Left epiretinal membrane 11/10/2019  ? Right epiretinal membrane 11/10/2019  ? Cystoid macular edema of left eye 11/10/2019  ? Glaucoma suspect, bilateral 11/10/2019  ? Cancer ([place]) 10/04/2019  ? Closed Colles' fracture 07/03/2017  ? Distal radius fracture, right 06/05/2017  ? Contusion of left knee 11/27/2016  ? Contusion of right  knee 11/27/2016  ? Depression 03/26/2014  ? Depressed affect 09/25/2013  ? Difficulty sleeping 09/25/2013  ? Diabetes mellitus without complication ([place]) 40/98/1191  ? Diverticular disease 07/17/2012  ? Diverticulosis of colon 07/17/2012  ? Acute blood loss anemia 07/16/2012  ? Arthritis 07/14/2012  ? Bladder cancer ([place]) 07/14/2012  ? ? ?Past Surgical History:  ?Procedure Laterality Date  ? ABDOMINAL HYSTERECTOMY    ? BACK SURGERY    ? BLADDER SURGERY    ? BROW LIFT Bilateral 05/27/2021  ? Procedure: BLEPHAROPLASTY UPPER EYELID; W/EXCESS SKIN AND BROW PTOSIS REPAIR BILATERAL;  Surgeon: Karle Starch, MD;  Location: [facility];  Service: Ophthalmology;  Laterality: Bilateral;  ? CARPAL TUNNEL RELEASE Right 06/06/2017  ? Procedure: CARPAL TUNNEL RELEASE;  Surgeon: [name], MD;  Location: [facility];  Service: Orthopedics;  Laterality: Right;  ? EYE SURGERY Left 11/26/2019  ? Dr. [name], Vit, Mem [place]  ? I & D EXTREMITY Right 06/06/2017  ? Procedure: IRRIGATION AND DEBRIDEMENT EXTREMITY--RIGHT ARM;  Surgeon: [name], MD;  Location: [facility];  Service: Orthopedics;  Laterality: Right;  ? KNEE ARTHROSCOPY Left 10/06/2019  ? Procedure: ARTHROSCOPY KNEE,PARTIAL MEDIAL MENISECTOMY,PARTIAL CHONDROPLASTY;  Surgeon: [name], MD;  Location: [facility];  Service: Orthopedics;  Laterality: Left;  ? KNEE SURGERY    ? OPEN REDUCTION INTERNAL FIXATION (ORIF) DISTAL RADIAL FRACTURE N/A 06/06/2017  ? Procedure: OPEN  REDUCTION INTERNAL FIXATION (ORIF) DISTAL RADIAL FRACTURE;  Surgeon: [name], MD;  Location: [facility];  Service: Orthopedics;  Laterality: N/A;  ? ? ?OB History   ?No obstetric history on file. ?  ? ? ? ?Home Medications   ? ?Prior to Admission medications   ?Medication Sig Start Date End Date Taking? Authorizing Provider  ?furosemide (LASIX) 40 MG tablet Take 40 mg by mouth daily as needed for fluid.   Yes [provider]  ?gabapentin (NEURONTIN) 100 MG capsule Take 100 mg by  mouth at bedtime.   Yes [provider]  ?losartan (COZAAR) 100 MG tablet Take 100 mg by mouth every evening.    Yes [provider]  ?rosuvastatin (CRESTOR) 5 MG tablet Take 5 mg by mouth at bedtime.    Yes [provider]  ?triamterene-hydrochlorothiazide (MAXZIDE-25) 37.5-25 MG tablet Take 1 tablet by mouth daily.   Yes [provider]  ?amLODipine (NORVASC) 5 MG tablet Take 1 tablet (5 mg total) by mouth daily. 08/16/16 05/27/21  Merlyn Lot, MD  ?erythromycin ophthalmic ointment Apply to sutures 4 times a day for 10-12 days.  Discontinue if allergy develops and call our office 05/27/21   Karle Starch, MD  ?loratadine (CLARITIN) 10 MG tablet Take 10 mg by mouth daily as needed for allergies. ?Patient not taking: Reported on 05/19/2021    [provider]  ?traMADol (ULTRAM) 50 MG tablet Take 1 every 4-6 hours as needed for pain not controlled by Tylenol 05/27/21   Karle Starch, MD  ? ? ?Family History ?Family History  ?Problem Relation Age of Onset  ? Hypertension Mother   ? Hypertension Father   ? CAD Father   ? Breast cancer Neg Hx   ? ? ?Social History ?Social History  ? ?Tobacco Use  ? Smoking status: Former  ?  Types: Cigarettes  ?  Quit date: 03/01/1983  ?  Years since quitting: 38.4  ? Smokeless tobacco: Never  ?Vaping Use  ? Vaping Use: Never used  ?Substance Use Topics  ? Alcohol use: No  ? Drug use: No  ? ? ? ?Allergies   ?Trazodone ? ? ?Review of Systems ?Review of Systems  ?Constitutional:  Positive for fatigue. Negative for chills, diaphoresis and fever.  ?HENT:  Negative for congestion, ear pain and sore throat.   ?Respiratory:  Positive for shortness of breath. Negative for cough and wheezing.   ?Cardiovascular:  Negative for chest pain and palpitations.  ?Gastrointestinal:  Negative for abdominal pain, nausea and vomiting.  ?Musculoskeletal:  Negative for arthralgias and myalgias.  ?Skin:  Negative for rash.  ?Neurological:  Negative for dizziness,  syncope, weakness and headaches.  ?Hematological:  Negative for adenopathy.  ? ? ?Physical Exam ?Triage Vital Signs ?ED Triage Vitals  ?Enc Vitals Group  ?   BP   ?   Pulse   ?   Resp   ?   Temp   ?   Temp src   ?   SpO2   ?   Weight   ?   Height   ?   Head Circumference   ?   Peak Flow   ?   Pain Score   ?   Pain Loc   ?   Pain Edu?   ?   Excl. in [place]?   ? ?No data found. ? ?Updated Vital Signs ?BP 130/72 (BP Location: Left Arm)   Pulse 63   Temp 98.1 ?F (36.7 ?C) (Oral)  Resp 18   Ht '5\' 4"'$  (1.626 m)   Wt 220 lb (99.8 kg)   SpO2 97%   BMI 37.76 kg/m?  ?  ? ?Physical Exam ?Vitals and nursing note reviewed.  ?Constitutional:   ?   General: She is not in acute distress. ?   Appearance: Normal appearance. She is obese. She is not ill-appearing or toxic-appearing.  ?HENT:  ?   Head: Normocephalic and atraumatic.  ?   Nose: Nose normal.  ?   Mouth/Throat:  ?   Mouth: Mucous membranes are moist.  ?   Pharynx: Oropharynx is clear.  ?Eyes:  ?   General: No scleral icterus.    ?   Right eye: No discharge.     ?   Left eye: No discharge.  ?   Conjunctiva/sclera: Conjunctivae normal.  ?Cardiovascular:  ?   Rate and Rhythm: Normal rate and regular rhythm.  ?   Heart sounds: Normal heart sounds.  ?Pulmonary:  ?   Effort: Pulmonary effort is normal. No respiratory distress.  ?   Breath sounds: Normal breath sounds.  ?Abdominal:  ?   Palpations: Abdomen is soft.  ?   Tenderness: There is no abdominal tenderness.  ?Musculoskeletal:  ?   Cervical back: Neck supple.  ?Skin: ?   General: Skin is dry.  ?Neurological:  ?   General: No focal deficit present.  ?   Mental Status: She is alert. Mental status is at baseline.  ?   Motor: No weakness.  ?   Coordination: Coordination normal.  ?   Gait: Gait normal.  ?Psychiatric:     ?   Mood and Affect: Mood normal.     ?   Behavior: Behavior normal.     ?   Thought Content: Thought content normal.  ? ? ? ?UC Treatments / Results  ?Labs ?(all labs ordered are listed, but only abnormal  results are displayed) ?Labs Reviewed  ?CBC WITH DIFFERENTIAL/PLATELET  ?TSH  ?LYME DISEASE SEROLOGY W/REFLEX  ? ? ?EKG ? ? ?Radiology ?No results found. ? ?Procedures ?ED EKG ? ?Date/Time: 07/28/2021 1:12 PM ?Per

## 2021-07-28 NOTE — ED Triage Notes (Signed)
Pt c/o fatigue, "breathless without exertion" x3weeks ? ?Pt tested positive for [facility] Spotted Fever and Lyme recently and has been having the same SOB and fatigue.  ?

## 2021-07-29 LAB — LYME DISEASE SEROLOGY W/REFLEX: Lyme Total Antibody EIA: NEGATIVE

## 2021-11-08 ENCOUNTER — Other Ambulatory Visit: Payer: Self-pay | Admitting: Family Medicine

## 2021-11-08 DIAGNOSIS — Z1231 Encounter for screening mammogram for malignant neoplasm of breast: Secondary | ICD-10-CM

## 2022-01-05 ENCOUNTER — Ambulatory Visit
Admission: RE | Admit: 2022-01-05 | Discharge: 2022-01-05 | Disposition: A | Discharge status: Home / Self Care | Payer: Medicare Other | Source: Ambulatory Visit | Attending: Family Medicine | Admitting: Family Medicine

## 2022-01-05 DIAGNOSIS — Z1231 Encounter for screening mammogram for malignant neoplasm of breast: Secondary | ICD-10-CM | POA: Insufficient documentation

## 2022-01-17 ENCOUNTER — Encounter: Payer: Self-pay | Admitting: Family Medicine

## 2022-01-17 ENCOUNTER — Other Ambulatory Visit: Payer: Self-pay | Admitting: Family Medicine

## 2022-01-17 ENCOUNTER — Ambulatory Visit (INDEPENDENT_AMBULATORY_CARE_PROVIDER_SITE_OTHER): Payer: Medicare Other | Admitting: Family Medicine

## 2022-01-17 VITALS — BP 138/70 | HR 63 | Ht 64.0 in | Wt 219.0 lb

## 2022-01-17 DIAGNOSIS — I10 Essential (primary) hypertension: Secondary | ICD-10-CM

## 2022-01-17 DIAGNOSIS — R7303 Prediabetes: Secondary | ICD-10-CM

## 2022-01-17 DIAGNOSIS — E782 Mixed hyperlipidemia: Secondary | ICD-10-CM

## 2022-01-17 DIAGNOSIS — Z7689 Persons encountering health services in other specified circumstances: Secondary | ICD-10-CM

## 2022-01-17 DIAGNOSIS — G4733 Obstructive sleep apnea (adult) (pediatric): Secondary | ICD-10-CM

## 2022-01-17 DIAGNOSIS — G6289 Other specified polyneuropathies: Secondary | ICD-10-CM | POA: Diagnosis not present

## 2022-01-17 DIAGNOSIS — E1169 Type 2 diabetes mellitus with other specified complication: Secondary | ICD-10-CM | POA: Insufficient documentation

## 2022-01-17 DIAGNOSIS — I129 Hypertensive chronic kidney disease with stage 1 through stage 4 chronic kidney disease, or unspecified chronic kidney disease: Secondary | ICD-10-CM | POA: Insufficient documentation

## 2022-01-17 MED ORDER — METFORMIN HCL ER 500 MG PO TB24: 500.0000 mg | ORAL_TABLET | Freq: Every day | ORAL | 1 refills | Status: DC

## 2022-01-17 NOTE — Patient Instructions (Addendum)
Thank you for coming to the office today.  Start Metformin XR '500mg'$  once daily with supper, caution may have some looser stools that should improve over time.  Weekly injections Mounjaro Ozempic Trulicity  Rybelsus (oral ozempic)  -----------------------------  [facility]) [facility] at [address] [place],  [address] [address]: [phone]   Dr Rockey Situ, Dr End  Or if at [facility] still - recommend Dr Saralyn Pilar  DUE for FASTING BLOOD WORK (no food or drink after midnight before the lab appointment, only water or coffee without cream/sugar on the morning of)  SCHEDULE "Lab Only" visit in the morning at the clinic for lab draw in 3 MONTHS  - Make sure Lab Only appointment is at about 1 week before your next appointment, so that results will be available  For Lab Results, once available within 2-3 days of blood draw, you can can log in to MyChart online to view your results and a brief explanation. Also, we can discuss results at next follow-up visit.   Please schedule a Follow-up Appointment to: Return in about 3 months (around 04/19/2022) for 3 month fasting lab only then 1 week later Follow-up DM, HTN, lab results.  If you have any other questions or concerns, please feel free to call the office or send a message through [place]. You may also schedule an earlier appointment if necessary.  Additionally, you may be receiving a survey about your experience at our office within a few days to 1 week by e-mail or mail. We value your feedback.  Nobie Putnam, DO [place]

## 2022-01-17 NOTE — Progress Notes (Signed)
Subjective:    Patient ID: Kelsey Mendoza, female    DOB: April 08, 1951, 71 y.o.   MRN: [id number]  Kelsey Mendoza is a 71 y.o. female presenting on 01/17/2022 for Establish Care  Here to establish new PCP. Previous PCP Dr Louanne Skye to [facility] 2012, born in [place], and has relocated from [facility] to [facility]  HPI  CHRONIC HTN: Family History of Heart Attacks Family History Aortic Dissection Reports fam history father with MI and Aortic Dissection lethal at age 54, also older brother with CAD Multiple stents placed and CABG ECHO and functional study 2017 Current Meds - Amlodipine '5mg'$  daily, Triamterene_HCTZ 37.5-'25mg'$  daily, Losartan '100mg'$  daily, also Furosemide '40mg'$  daily PRN Reports good compliance, took meds today. Tolerating well, w/o complaints. Denies CP, dyspnea, HA, edema, dizziness / lightheadedness  Pre-Diabetes vs T2DM Reports concerns with A1c increasing 6.5 to 6.7 to 7.3 Older sister with possible diabetes Meds: Never on medication Currently on ARB Lifestyle: - Diet (not adhering to low carb low sugar)  - Exercise (limited) Opthalmology specialists. She has seen retina Denies hypoglycemia, polyuria, visual changes, numbness or tingling.  OSA on CPAP Using CPAP nightly with good results, benefit from therapy  Left Knee, Chronic Pain S/p Meniscus repair Followed [facility] S/p multiple cortisone injections   Health Maintenance: Declines Vaccines today Flu Shot.     01/17/2022   10:38 AM  Depression screen PHQ 2/9  Decreased Interest 0  Down, Depressed, Hopeless 0  PHQ - 2 Score 0  Altered sleeping 1  Tired, decreased energy 1  Change in appetite 1  Feeling bad or failure about yourself  0  Trouble concentrating 0  Moving slowly or fidgety/restless 0  Suicidal thoughts 0  PHQ-9 Score 3  Difficult doing work/chores Not difficult at all    Past Medical History:  Diagnosis Date   Cancer ([place]) 2000s   bladder   CPAP (continuous positive  airway pressure) dependence    HLD (hyperlipidemia)    Hypertension    Knee pain    Pre-diabetes    Psoriasis of scalp    Sleep apnea    Past Surgical History:  Procedure Laterality Date   ABDOMINAL HYSTERECTOMY     BACK SURGERY     BLADDER SURGERY     BROW LIFT Bilateral 05/27/2021   Procedure: BLEPHAROPLASTY UPPER EYELID; W/EXCESS SKIN AND BROW PTOSIS REPAIR BILATERAL;  Surgeon: Karle Starch, MD;  Location: [facility];  Service: Ophthalmology;  Laterality: Bilateral;   CARPAL TUNNEL RELEASE Right 06/06/2017   Procedure: CARPAL TUNNEL RELEASE;  Surgeon: [name], MD;  Location: [facility];  Service: Orthopedics;  Laterality: Right;   EYE SURGERY Left 11/26/2019   Dr. [name], Vit, Mem Rito Ehrlich   I & D EXTREMITY Right 06/06/2017   Procedure: IRRIGATION AND DEBRIDEMENT EXTREMITY--RIGHT ARM;  Surgeon: [name], MD;  Location: [facility];  Service: Orthopedics;  Laterality: Right;   KNEE ARTHROSCOPY Left 10/06/2019   Procedure: ARTHROSCOPY KNEE,PARTIAL MEDIAL MENISECTOMY,PARTIAL CHONDROPLASTY;  Surgeon: [name], MD;  Location: [facility];  Service: Orthopedics;  Laterality: Left;   KNEE SURGERY     OPEN REDUCTION INTERNAL FIXATION (ORIF) DISTAL RADIAL FRACTURE N/A 06/06/2017   Procedure: OPEN REDUCTION INTERNAL FIXATION (ORIF) DISTAL RADIAL FRACTURE;  Surgeon: [name], MD;  Location: [facility];  Service: Orthopedics;  Laterality: N/A;   Social History   Socioeconomic History   Marital status: Widowed    Spouse name: Not on file   Number  of children: Not on file   Years of education: Not on file   Highest education level: Not on file  Occupational History   Not on file  Tobacco Use   Smoking status: Former    Types: Cigarettes    Quit date: 03/01/1983    Years since quitting: 38.9   Smokeless tobacco: Never  Vaping Use   Vaping Use: Never used  Substance and Sexual Activity   Alcohol use: No   Drug use: No   Sexual activity: Not on file  Other  Topics Concern   Not on file  Social History Narrative   Not on file   Social Determinants of Health   Financial Resource Strain: Not on file  Food Insecurity: Not on file  Transportation Needs: Not on file  Physical Activity: Not on file  Stress: Not on file  Social Connections: Not on file  Intimate Partner Violence: Not on file   Family History  Problem Relation Age of Onset   Hypertension Mother    Hypertension Father    CAD Father    Breast cancer Neg Hx    Current Outpatient Medications on File Prior to Visit  Medication Sig   furosemide (LASIX) 40 MG tablet Take 40 mg by mouth daily as needed for fluid.   gabapentin (NEURONTIN) 100 MG capsule Take 100 mg by mouth at bedtime.   loratadine (CLARITIN) 10 MG tablet Take 10 mg by mouth daily as needed for allergies.   losartan (COZAAR) 100 MG tablet Take 100 mg by mouth every evening.   rosuvastatin (CRESTOR) 5 MG tablet Take 5 mg by mouth at bedtime.    triamterene-hydrochlorothiazide (MAXZIDE-25) 37.5-25 MG tablet Take 1 tablet by mouth daily.   amLODipine (NORVASC) 5 MG tablet Take 1 tablet (5 mg total) by mouth daily.   No current facility-administered medications on file prior to visit.    Review of Systems Per HPI unless specifically indicated above     Objective:    BP (!) 146/66   Pulse 63   Ht '5\' 4"'$  (1.626 m)   Wt 219 lb (99.3 kg)   SpO2 95%   BMI 37.59 kg/m   Wt Readings from Last 3 Encounters:  01/17/22 219 lb (99.3 kg)  07/28/21 220 lb (99.8 kg)  06/27/21 216 lb (98 kg)    Physical Exam Vitals and nursing note reviewed.  Constitutional:      General: She is not in acute distress.    Appearance: Normal appearance. She is well-developed. She is not diaphoretic.     Comments: Well-appearing, comfortable, cooperative  HENT:     Head: Normocephalic and atraumatic.  Eyes:     General:        Right eye: No discharge.        Left eye: No discharge.     Conjunctiva/sclera: Conjunctivae normal.   Cardiovascular:     Rate and Rhythm: Normal rate.  Pulmonary:     Effort: Pulmonary effort is normal.  Skin:    General: Skin is warm and dry.     Findings: No erythema or rash.  Neurological:     Mental Status: She is alert and oriented to person, place, and time.  Psychiatric:        Mood and Affect: Mood normal.        Behavior: Behavior normal.        Thought Content: Thought content normal.     Comments: Well groomed, good eye contact, normal speech and thoughts  Results for orders placed or performed during the hospital encounter of 07/28/21  CBC with Differential  Result Value Ref Range   WBC 7.3 4.0 - 10.5 K/uL   RBC 4.71 3.87 - 5.11 MIL/uL   Hemoglobin 13.7 12.0 - 15.0 g/dL   HCT 41.2 36.0 - 46.0 %   MCV 87.5 80.0 - 100.0 fL   MCH 29.1 26.0 - 34.0 pg   MCHC 33.3 30.0 - 36.0 g/dL   RDW 13.1 11.5 - 15.5 %   Platelets 223 150 - 400 K/uL   nRBC 0.0 0.0 - 0.2 %   Neutrophils Relative % 64 %   Neutro Abs 4.7 1.7 - 7.7 K/uL   Lymphocytes Relative 24 %   Lymphs Abs 1.7 0.7 - 4.0 K/uL   Monocytes Relative 9 %   Monocytes Absolute 0.6 0.1 - 1.0 K/uL   Eosinophils Relative 2 %   Eosinophils Absolute 0.1 0.0 - 0.5 K/uL   Basophils Relative 1 %   Basophils Absolute 0.1 0.0 - 0.1 K/uL   Immature Granulocytes 0 %   Abs Immature Granulocytes 0.02 0.00 - 0.07 K/uL  TSH  Result Value Ref Range   TSH 2.797 0.350 - 4.500 uIU/mL  Lyme Disease Serology w/Reflex  Result Value Ref Range   Lyme Total Antibody EIA Negative Negative      Assessment & Plan:   Problem List Items Addressed This Visit     OSA on CPAP   Pre-diabetes - Primary   Relevant Medications   metFORMIN (GLUCOPHAGE-XR) 500 MG 24 hr tablet   Other Visit Diagnoses     Other polyneuropathy       Encounter to establish care with new doctor          Establish care today  Pre-Diabetes vs Type 2 Diabetes Review past records, prior A1c >6.5 recently at risk of progression to Type 2 Diabetes No  prior medication, not adhering to diet Discussion on lifestyle modification, med options Start low dose Metformin XR '500mg'$  daily for Pre Diabetes management, will review diagnosis and indication at future, reconsider GLP1 therapy  HTN Currently controlled Continue current med regimen  Fam history of CAD Advice given on re-establishing with Cardiology, after [facility] Lavell Islam [facility] or [facility]  OSA on CPAP - Good adherence to CPAP nightly - Continue current CPAP therapy, patient seems to be benefiting from therapy   Meds ordered this encounter  Medications   metFORMIN (GLUCOPHAGE-XR) 500 MG 24 hr tablet    Sig: Take 1 tablet (500 mg total) by mouth daily with supper.    Dispense:  90 tablet    Refill:  1      Follow up plan: Return in about 3 months (around 04/19/2022) for 3 month fasting lab only then 1 week later Follow-up DM, HTN, lab results.  Future lab orders 04/17/22  Nobie Putnam, [facility] 01/17/2022, 10:35 AM

## 2022-04-14 ENCOUNTER — Other Ambulatory Visit: Payer: Self-pay

## 2022-04-14 DIAGNOSIS — I10 Essential (primary) hypertension: Secondary | ICD-10-CM

## 2022-04-14 DIAGNOSIS — E782 Mixed hyperlipidemia: Secondary | ICD-10-CM

## 2022-04-14 DIAGNOSIS — R7303 Prediabetes: Secondary | ICD-10-CM

## 2022-04-17 ENCOUNTER — Other Ambulatory Visit: Payer: Medicare Other

## 2022-04-18 ENCOUNTER — Encounter: Payer: Self-pay | Admitting: Family Medicine

## 2022-04-18 DIAGNOSIS — R7989 Other specified abnormal findings of blood chemistry: Secondary | ICD-10-CM | POA: Insufficient documentation

## 2022-04-18 LAB — CBC WITH DIFFERENTIAL/PLATELET
Absolute Monocytes: 905 cells/uL (ref 200–950)
Basophils Absolute: 104 cells/uL (ref 0–200)
Basophils Relative: 0.9 %
Eosinophils Absolute: 139 cells/uL (ref 15–500)
Eosinophils Relative: 1.2 %
HCT: 44.6 % (ref 35.0–45.0)
Hemoglobin: 14.6 g/dL (ref 11.7–15.5)
Lymphs Abs: 2146 cells/uL (ref 850–3900)
MCH: 28.5 pg (ref 27.0–33.0)
MCHC: 32.7 g/dL (ref 32.0–36.0)
MCV: 86.9 fL (ref 80.0–100.0)
MPV: 11.9 fL (ref 7.5–12.5)
Monocytes Relative: 7.8 %
Neutro Abs: 8306 cells/uL — ABNORMAL HIGH (ref 1500–7800)
Neutrophils Relative %: 71.6 %
Platelets: 232 10*3/uL (ref 140–400)
RBC: 5.13 10*6/uL — ABNORMAL HIGH (ref 3.80–5.10)
RDW: 12.8 % (ref 11.0–15.0)
Total Lymphocyte: 18.5 %
WBC: 11.6 10*3/uL — ABNORMAL HIGH (ref 3.8–10.8)

## 2022-04-18 LAB — HEMOGLOBIN A1C
Hgb A1c MFr Bld: 7.3 % of total Hgb — ABNORMAL HIGH (ref <5.7)
Mean Plasma Glucose: 163 mg/dL
eAG (mmol/L): 9 mmol/L

## 2022-04-18 LAB — COMPLETE METABOLIC PANEL WITH GFR
AG Ratio: 1.4 (calc) (ref 1.0–2.5)
ALT: 20 U/L (ref 6–29)
AST: 16 U/L (ref 10–35)
Albumin: 4.2 g/dL (ref 3.6–5.1)
Alkaline phosphatase (APISO): 96 U/L (ref 37–153)
BUN/Creatinine Ratio: 13 (calc) (ref 6–22)
BUN: 18 mg/dL (ref 7–25)
CO2: 31 mmol/L (ref 20–32)
Calcium: 10.2 mg/dL (ref 8.6–10.4)
Chloride: 100 mmol/L (ref 98–110)
Creat: 1.36 mg/dL — ABNORMAL HIGH (ref 0.60–1.00)
Globulin: 2.9 g/dL (calc) (ref 1.9–3.7)
Glucose, Bld: 126 mg/dL — ABNORMAL HIGH (ref 65–99)
Potassium: 4.7 mmol/L (ref 3.5–5.3)
Sodium: 138 mmol/L (ref 135–146)
Total Bilirubin: 0.5 mg/dL (ref 0.2–1.2)
Total Protein: 7.1 g/dL (ref 6.1–8.1)
eGFR: 41 mL/min/{1.73_m2} — ABNORMAL LOW (ref >=60)

## 2022-04-18 LAB — LIPID PANEL
Cholesterol: 175 mg/dL (ref <200)
HDL: 53 mg/dL (ref >=50)
LDL Cholesterol (Calc): 95 mg/dL (calc)
Non-HDL Cholesterol (Calc): 122 mg/dL (calc) (ref <130)
Total CHOL/HDL Ratio: 3.3 (calc) (ref <5.0)
Triglycerides: 176 mg/dL — ABNORMAL HIGH (ref <150)

## 2022-04-18 LAB — TSH: TSH: 5.47 mIU/L — ABNORMAL HIGH (ref 0.40–4.50)

## 2022-04-20 ENCOUNTER — Other Ambulatory Visit: Payer: Self-pay | Admitting: Family Medicine

## 2022-04-20 ENCOUNTER — Encounter: Payer: Self-pay | Admitting: Family Medicine

## 2022-04-20 ENCOUNTER — Ambulatory Visit (INDEPENDENT_AMBULATORY_CARE_PROVIDER_SITE_OTHER): Payer: Medicare Other | Admitting: Family Medicine

## 2022-04-20 VITALS — BP 130/78 | HR 63 | Ht 64.0 in | Wt 221.0 lb

## 2022-04-20 DIAGNOSIS — R7989 Other specified abnormal findings of blood chemistry: Secondary | ICD-10-CM

## 2022-04-20 DIAGNOSIS — I10 Essential (primary) hypertension: Secondary | ICD-10-CM | POA: Diagnosis not present

## 2022-04-20 DIAGNOSIS — G4733 Obstructive sleep apnea (adult) (pediatric): Secondary | ICD-10-CM | POA: Diagnosis not present

## 2022-04-20 DIAGNOSIS — I129 Hypertensive chronic kidney disease with stage 1 through stage 4 chronic kidney disease, or unspecified chronic kidney disease: Secondary | ICD-10-CM

## 2022-04-20 DIAGNOSIS — E1169 Type 2 diabetes mellitus with other specified complication: Secondary | ICD-10-CM

## 2022-04-20 DIAGNOSIS — E785 Hyperlipidemia, unspecified: Secondary | ICD-10-CM

## 2022-04-20 MED ORDER — MOUNJARO 5 MG/0.5ML ~~LOC~~ SOAJ: 5.0000 mg | SUBCUTANEOUS | 2 refills | Status: DC

## 2022-04-20 NOTE — Patient Instructions (Addendum)
Thank you for coming to the office today.  Start Mounjaro 2.'5mg'$  weekly inj for 4 weeks, then we will order the '5mg'$  weekly inj  May phase off of metformin after a few weeks on the mounjaro if all is going well.  Recent Labs    04/17/22 0810  HGBA1C 7.3*   Repeat Thyroid labs next time.  DUE for FASTING BLOOD WORK (no food or drink after midnight before the lab appointment, only water or coffee without cream/sugar on the morning of)  SCHEDULE "Lab Only" visit in the morning at the clinic for lab draw in 4 MONTHS   - Make sure Lab Only appointment is at about 1 week before your next appointment, so that results will be available  For Lab Results, once available within 2-3 days of blood draw, you can can log in to MyChart online to view your results and a brief explanation. Also, we can discuss results at next follow-up visit.    Please schedule a Follow-up Appointment to: Return in about 4 months (around 08/19/2022) for 4 month fasting lab then 1 week later Follow-up Diabetes, CKD, meds.  If you have any other questions or concerns, please feel free to call the office or send a message through [place]. You may also schedule an earlier appointment if necessary.  Additionally, you may be receiving a survey about your experience at our office within a few days to 1 week by e-mail or mail. We value your feedback.  Nobie Putnam, DO [place]

## 2022-04-20 NOTE — Assessment & Plan Note (Signed)
Well-controlled HTN Complication with CKD-III Cr 1.36, similar to prior baseline 1.3 range   Plan:  1. Continue current BP regimen - see list. 2. Encourage improved lifestyle - low sodium diet, regular exercise 3. Continue monitor BP outside office, bring readings to next visit, if persistently >140/90 or new symptoms notify office sooner  Improve hydration, repeat chemistry 4 months

## 2022-04-20 NOTE — Progress Notes (Signed)
Subjective:    Patient ID: Kelsey Mendoza, female    DOB: Sep 29, 1950, 72 y.o.   MRN: [id number]  Kelsey Mendoza is a 72 y.o. female presenting on 04/20/2022 for Prediabetes and Hypertension   HPI  CHRONIC HYPERTENSION CKD Stage III Family History of Heart Attacks Family History Aortic Dissection Reports fam history father with MI and Aortic Dissection lethal at age 72, also older brother with CAD Multiple stents placed and CABG ECHO and functional study 2017  Last lab with Cr 1.4 range, prior 1.3 to 1.4 in past. Baseline.  Current Meds - Amlodipine '5mg'$  daily, Triamterene_HCTZ 37.5-'25mg'$  daily, Losartan '100mg'$  daily, also Furosemide '40mg'$  daily PRN Reports good compliance, took meds today. Tolerating well, w/o complaints. Denies CP, dyspnea, HA, edema, dizziness / lightheadedness   Pre-Diabetes vs T2DM Reports concerns with A1c increasing 6.5 to 6.7 to 7.3 Older sister with possible diabetes Meds: Metformin XR '500mg'$  daily tolerating well Currently on ARB Lifestyle: Goal to improve diet - Exercise (limited) Opthalmology specialists. She has seen retina Denies hypoglycemia, polyuria, visual changes, numbness or tingling.   OSA on CPAP Using CPAP nightly with good results, benefit from therapy  Elevated TSH Lab 5.4, prior normal 3 range Never on rx medication for thyroid before. No prior diagnosis   Additional question Persistent non productive cough for a while, in past was advised by Cardiology may be due to statin. She is not on ACEi. She is on ARB.      04/20/2022    9:01 AM 01/17/2022   10:38 AM  Depression screen PHQ 2/9  Decreased Interest 0 0  Down, Depressed, Hopeless 0 0  PHQ - 2 Score 0 0  Altered sleeping 3 1  Tired, decreased energy 2 1  Change in appetite 0 1  Feeling bad or failure about yourself  0 0  Trouble concentrating 0 0  Moving slowly or fidgety/restless 0 0  Suicidal thoughts 0 0  PHQ-9 Score 5 3  Difficult doing work/chores Not  difficult at all Not difficult at all    Social History   Tobacco Use   Smoking status: Former    Types: Cigarettes    Quit date: 03/01/1983    Years since quitting: 39.1   Smokeless tobacco: Never  Vaping Use   Vaping Use: Never used  Substance Use Topics   Alcohol use: No   Drug use: No    Review of Systems Per HPI unless specifically indicated above     Objective:    BP 130/78   Pulse 63   Ht '5\' 4"'$  (1.626 m)   Wt 221 lb (100.2 kg)   SpO2 97%   BMI 37.93 kg/m   Wt Readings from Last 3 Encounters:  04/20/22 221 lb (100.2 kg)  01/17/22 219 lb (99.3 kg)  07/28/21 220 lb (99.8 kg)    Physical Exam Vitals and nursing note reviewed.  Constitutional:      General: She is not in acute distress.    Appearance: Normal appearance. She is well-developed. She is not diaphoretic.     Comments: Well-appearing, comfortable, cooperative  HENT:     Head: Normocephalic and atraumatic.  Eyes:     General:        Right eye: No discharge.        Left eye: No discharge.     Conjunctiva/sclera: Conjunctivae normal.  Cardiovascular:     Rate and Rhythm: Normal rate.  Pulmonary:     Effort: Pulmonary effort is normal.  Skin:    General: Skin is warm and dry.     Findings: No erythema or rash.  Neurological:     Mental Status: She is alert and oriented to person, place, and time.  Psychiatric:        Mood and Affect: Mood normal.        Behavior: Behavior normal.        Thought Content: Thought content normal.     Comments: Well groomed, good eye contact, normal speech and thoughts      Results for orders placed or performed in visit on 04/14/22  TSH  Result Value Ref Range   TSH 5.47 (H) 0.40 - 4.50 mIU/L  Hemoglobin A1c  Result Value Ref Range   Hgb A1c MFr Bld 7.3 (H) <5.7 % of total Hgb   Mean Plasma Glucose 163 mg/dL   eAG (mmol/L) 9.0 mmol/L  Lipid panel  Result Value Ref Range   Cholesterol 175 <200 mg/dL   HDL 53 > OR = 50 mg/dL   Triglycerides 176 (H)  <150 mg/dL   LDL Cholesterol (Calc) 95 mg/dL (calc)   Total CHOL/HDL Ratio 3.3 <5.0 (calc)   Non-HDL Cholesterol (Calc) 122 <130 mg/dL (calc)  CBC with Differential/Platelet  Result Value Ref Range   WBC 11.6 (H) 3.8 - 10.8 Thousand/uL   RBC 5.13 (H) 3.80 - 5.10 Million/uL   Hemoglobin 14.6 11.7 - 15.5 g/dL   HCT 44.6 35.0 - 45.0 %   MCV 86.9 80.0 - 100.0 fL   MCH 28.5 27.0 - 33.0 pg   MCHC 32.7 32.0 - 36.0 g/dL   RDW 12.8 11.0 - 15.0 %   Platelets 232 140 - 400 Thousand/uL   MPV 11.9 7.5 - 12.5 fL   Neutro Abs 8,306 (H) 1,500 - 7,800 cells/uL   Lymphs Abs 2,146 850 - 3,900 cells/uL   Absolute Monocytes 905 200 - 950 cells/uL   Eosinophils Absolute 139 15 - 500 cells/uL   Basophils Absolute 104 0 - 200 cells/uL   Neutrophils Relative % 71.6 %   Total Lymphocyte 18.5 %   Monocytes Relative 7.8 %   Eosinophils Relative 1.2 %   Basophils Relative 0.9 %  COMPLETE METABOLIC PANEL WITH GFR  Result Value Ref Range   Glucose, Bld 126 (H) 65 - 99 mg/dL   BUN 18 7 - 25 mg/dL   Creat 1.36 (H) 0.60 - 1.00 mg/dL   eGFR 41 (L) > OR = 60 mL/min/1.73m   BUN/Creatinine Ratio 13 6 - 22 (calc)   Sodium 138 135 - 146 mmol/L   Potassium 4.7 3.5 - 5.3 mmol/L   Chloride 100 98 - 110 mmol/L   CO2 31 20 - 32 mmol/L   Calcium 10.2 8.6 - 10.4 mg/dL   Total Protein 7.1 6.1 - 8.1 g/dL   Albumin 4.2 3.6 - 5.1 g/dL   Globulin 2.9 1.9 - 3.7 g/dL (calc)   AG Ratio 1.4 1.0 - 2.5 (calc)   Total Bilirubin 0.5 0.2 - 1.2 mg/dL   Alkaline phosphatase (APISO) 96 37 - 153 U/L   AST 16 10 - 35 U/L   ALT 20 6 - 29 U/L      Assessment & Plan:   Problem List Items Addressed This Visit     Benign hypertension with CKD (chronic kidney disease) stage III (HCC)    Well-controlled HTN Complication with CKD-III Cr 1.36, similar to prior baseline 1.3 range   Plan:  1. Continue current BP regimen - see  list. 2. Encourage improved lifestyle - low sodium diet, regular exercise 3. Continue monitor BP outside  office, bring readings to next visit, if persistently >140/90 or new symptoms notify office sooner  Improve hydration, repeat chemistry 4 months      Relevant Medications   amLODipine (NORVASC) 5 MG tablet   Elevated TSH    Mild elevated TSH Repeat labs with +T4 in 4 months Consider new dx and treatment if indicated for hypothyroid      Hyperlipidemia associated with type 2 diabetes mellitus (HCC)    Controlled LDL on Statin low dose Question cough side effect, seems unrelated to statin      Relevant Medications   amLODipine (NORVASC) 5 MG tablet   MOUNJARO 5 MG/0.5ML Pen   OSA on CPAP    Well controlled, chronic OSA on CPAP - Good adherence to CPAP nightly - Continue current CPAP therapy, patient seems to be benefiting from therapy       Type 2 diabetes mellitus with other specified complication ([place]) - Primary    Mild elevated A1c new diagnosis T2DM, 6.5 to 7.3 range consistently now Complications - CKDIII, Hyperlipidemia, OSA  Plan:  1. Start Mounjaro GLP/GIP therapy - sample today 2.'5mg'$  weekly x 4 pen = 1 month, also sent Rx Mounjaro '5mg'$  weekly to get approved. - Continue current therapy - Metformin XR '500mg'$  daily - future may phase off if no longer needed 2. Encourage improved lifestyle - low carb, low sugar diet, reduce portion size, continue improving regular exercise 3. Check CBG, bring log to next visit for review 4. Continue ARB, Statin 5. Future DIABETES screenings 6. Follow-up 4 months A1c       Relevant Medications   MOUNJARO 5 MG/0.5ML Pen    Meds ordered this encounter  Medications   MOUNJARO 5 MG/0.5ML Pen    Sig: Inject 5 mg into the skin once a week.    Dispense:  2 mL    Refill:  2      Follow up plan: Return in about 4 months (around 08/19/2022) for 4 month fasting lab then 1 week later Follow-up Diabetes, CKD, meds.  Future labs ordered for 08/18/22 TSH T4 BMET A1c   Nobie Putnam, DO [facility] 04/20/2022, 9:13 AM

## 2022-04-20 NOTE — Assessment & Plan Note (Signed)
Mild elevated TSH Repeat labs with +T4 in 4 months Consider new dx and treatment if indicated for hypothyroid

## 2022-04-20 NOTE — Assessment & Plan Note (Signed)
Controlled LDL on Statin low dose Question cough side effect, seems unrelated to statin

## 2022-04-20 NOTE — Assessment & Plan Note (Signed)
Mild elevated A1c new diagnosis T2DM, 6.5 to 7.3 range consistently now Complications - CKDIII, Hyperlipidemia, OSA  Plan:  1. Start Mounjaro GLP/GIP therapy - sample today 2.'5mg'$  weekly x 4 pen = 1 month, also sent Rx Mounjaro '5mg'$  weekly to get approved. - Continue current therapy - Metformin XR '500mg'$  daily - future may phase off if no longer needed 2. Encourage improved lifestyle - low carb, low sugar diet, reduce portion size, continue improving regular exercise 3. Check CBG, bring log to next visit for review 4. Continue ARB, Statin 5. Future DIABETES screenings 6. Follow-up 4 months A1c

## 2022-04-20 NOTE — Assessment & Plan Note (Signed)
Well controlled, chronic OSA on CPAP - Good adherence to CPAP nightly - Continue current CPAP therapy, patient seems to be benefiting from therapy  

## 2022-04-25 ENCOUNTER — Telehealth: Payer: Self-pay | Admitting: Family Medicine

## 2022-04-25 DIAGNOSIS — E1169 Type 2 diabetes mellitus with other specified complication: Secondary | ICD-10-CM

## 2022-04-25 NOTE — Telephone Encounter (Signed)
Pt is calling back to follow up. Pt stated the out-of-pocket cost for [facility] 5 MG/0.5ML Pen [[id number]] prescription will be $556. Pt called to request contact from a member of clinical staff to discuss potential alternatives.  Please contact the patient further when possible.   Copied from [place] [id number]. Topic: General - Other >> Apr 20, 2022  2:02 PM [name] wrote: Reason for CRM: The patient has called to share that the out of pocket cost for their [facility] 5 MG/0.5ML Pen [[id number]] prescription will be $556   The patient, as directed, as called to request contact from a member of clinical staff to discuss potential alternatives  Please contact the patient further when possible

## 2022-04-25 NOTE — Telephone Encounter (Signed)
I spoke with patient and she was not told any reason for the high price.  I called the pharmacy and they said it is covered by Medicare part D but her out of pocket cost is $556  This seems to show that they are only covering a portion of the med and it is not fully covered. As it is a high cost.  She tells me that she has a Medical illustrator but the pharmacy does not have this on file.  Could you please contact her back and follow up with her to find out if she can try checking with her 2ndary insurance and if she needs to submit that insurance card to the pharmacy so they can bill that one instead?  If she still has problems with it and high cost she can notify us again, we can refer her to our [facility] Pharmacist to help figure out a solution.  Kelsey Mendoza, [facility] 04/25/2022, 1:20 PM

## 2022-04-26 ENCOUNTER — Telehealth: Payer: Self-pay | Admitting: Family Medicine

## 2022-04-26 NOTE — Telephone Encounter (Signed)
The patient called in stating her MOUNJARO 5 MG/0.5ML Pen requires a prior authorization. She states she spoke with the pharmaceutical part of the insurance and the $565 was the deductible or total cost for the year. She said that broken down would only be $11 a month. She is also going to provide her secondary insurance to the pharmacy to see what can be done. She spoke with [facility] about this already and they are the ones that require prior authorization. She really wants to thank everyone for their help with this. Lastly she truly appreciates her provider calling her as that is the first time she has gotten a direct call specifically from her doctor and that meant a lot. Please assist patient further.

## 2022-04-27 NOTE — Telephone Encounter (Signed)
PA was approved and the patient picked up the prescription yesterday.

## 2022-05-25 ENCOUNTER — Other Ambulatory Visit: Payer: Self-pay | Admitting: Family Medicine

## 2022-05-25 NOTE — Telephone Encounter (Signed)
Requested medication (s) are due for refill today: yes  Requested medication (s) are on the active medication list: no  Last refill:  01/30/17  Future visit scheduled: yes  Notes to clinic:  Unable to refill per protocol, last refill by another provider. Historical provider, routing for approval.     Requested Prescriptions  Pending Prescriptions Disp Refills   rosuvastatin (CRESTOR) 5 MG tablet [Pharmacy Med Name: ROSUVASTATIN CALCIUM 5 MG TAB] 90 tablet 3    Sig: TAKE 1 TABLET BY MOUTH EVERY DAY     Cardiovascular:  Antilipid - Statins 2 Failed - 05/25/2022  1:41 AM      Failed - Cr in normal range and within 360 days    Creat  Date Value Ref Range Status  04/17/2022 1.36 (H) 0.60 - 1.00 mg/dL Final         Failed - Lipid Panel in normal range within the last 12 months    Cholesterol  Date Value Ref Range Status  04/17/2022 175 <200 mg/dL Final   LDL Cholesterol (Calc)  Date Value Ref Range Status  04/17/2022 95 mg/dL (calc) Final    Comment:    Reference range: <100 . Desirable range <100 mg/dL for primary prevention;   <70 mg/dL for patients with CHD or diabetic patients  with > or = 2 CHD risk factors. Marland Kitchen LDL-C is now calculated using the Martin-Hopkins  calculation, which is a validated novel method providing  better accuracy than the Friedewald equation in the  estimation of LDL-C.  Cresenciano Genre et al. Annamaria Helling. [id number]): 2061-2068  (http://education.[employer].com/faq/FAQ164)    HDL  Date Value Ref Range Status  04/17/2022 53 > OR = 50 mg/dL Final   Triglycerides  Date Value Ref Range Status  04/17/2022 176 (H) <150 mg/dL Final         Passed - Patient is not pregnant      Passed - Valid encounter within last 12 months    Recent Outpatient Visits           1 month ago Type 2 diabetes mellitus with other specified complication, without long-term current use of insulin [facility])   [facility] [employer], Devonne Doughty, DO    4 months ago [place], DO       Future Appointments             In 3 months [place], Devonne Doughty, [facility], [facility]

## 2022-07-10 ENCOUNTER — Telehealth: Payer: Self-pay | Admitting: Family Medicine

## 2022-07-10 DIAGNOSIS — E1169 Type 2 diabetes mellitus with other specified complication: Secondary | ICD-10-CM

## 2022-07-10 MED ORDER — MOUNJARO 7.5 MG/0.5ML ~~LOC~~ SOAJ: 7.5000 mg | SUBCUTANEOUS | 2 refills | Status: DC

## 2022-07-10 NOTE — Telephone Encounter (Signed)
Okay, I will send rx for dose increase new rx Mounjaro 7.5mg  weekly sent to pharmacy. There may be issues with receiving med if there is any back order or delay on this dosage.  Nobie Putnam, [facility] 07/10/2022, 1:24 PM

## 2022-07-10 NOTE — Telephone Encounter (Signed)
Patient called request tht provider up her dosage of the [facility] 5 MG/0.5ML Pen to 7.5 as the dosage she is on now does not have the same affect. Please advise

## 2022-07-13 ENCOUNTER — Other Ambulatory Visit: Payer: Self-pay | Admitting: Family Medicine

## 2022-07-13 DIAGNOSIS — E1169 Type 2 diabetes mellitus with other specified complication: Secondary | ICD-10-CM

## 2022-07-13 DIAGNOSIS — R7303 Prediabetes: Secondary | ICD-10-CM

## 2022-07-13 NOTE — Telephone Encounter (Signed)
Mounjaro discontinued 07/10/2022 due to dose change.  Requested Prescriptions  Pending Prescriptions Disp Refills   MOUNJARO 5 MG/0.5ML Pen [Pharmacy Med Name: MOUNJARO 5 MG/0.5 ML PEN]  2    Sig: INJECT 5 MG SUBCUTANEOUSLY WEEKLY     Off-Protocol Failed - 07/13/2022  2:36 AM      Failed - Medication not assigned to a protocol, review manually.      Passed - Valid encounter within last 12 months    Recent Outpatient Visits           2 months ago Type 2 diabetes mellitus with other specified complication, without long-term current use of insulin [facility])   [place], DO   5 months ago [place], DO       Future Appointments             In 1 month [facility], Devonne Doughty, [facility], [facility]             metFORMIN (GLUCOPHAGE-XR) 500 MG 24 hr tablet [Pharmacy Med Name: METFORMIN HCL ER 500 MG TABLET] 90 tablet 0    Sig: TAKE 1 TABLET BY MOUTH DAILY WITH SUPPER.     Endocrinology:  Diabetes - Biguanides Failed - 07/13/2022  2:36 AM      Failed - Cr in normal range and within 360 days    Creat  Date Value Ref Range Status  04/17/2022 1.36 (H) 0.60 - 1.00 mg/dL Final         Failed - eGFR in normal range and within 360 days    GFR calc Af Amer  Date Value Ref Range Status  06/07/2017 >60 >60 mL/min Final    Comment:    (NOTE) The eGFR has been calculated using the CKD EPI equation. This calculation has not been validated in all clinical situations. eGFR's persistently <60 mL/min signify possible Chronic Kidney Disease.    GFR calc non Af Amer  Date Value Ref Range Status  06/07/2017 >60 >60 mL/min Final   eGFR  Date Value Ref Range Status  04/17/2022 41 (L) > OR = 60 mL/min/1.60m2 Final         Failed - B12 Level in normal range and within 720 days    No results found for: "VITAMINB12"       Passed - HBA1C is  between 0 and 7.9 and within 180 days    Hgb A1c MFr Bld  Date Value Ref Range Status  04/17/2022 7.3 (H) <5.7 % of total Hgb Final    Comment:    For someone without known diabetes, a hemoglobin A1c value of 6.5% or greater indicates that they may have  diabetes and this should be confirmed with a follow-up  test. . For someone with known diabetes, a value <7% indicates  that their diabetes is well controlled and a value  greater than or equal to 7% indicates suboptimal  control. A1c targets should be individualized based on  duration of diabetes, age, comorbid conditions, and  other considerations. . Currently, no consensus exists regarding use of hemoglobin A1c for diagnosis of diabetes for children. Renella Cunas - Valid encounter within last 6 months    Recent Outpatient Visits           2 months ago Type 2 diabetes mellitus with other specified complication, without long-term  current use of insulin ([place])   [place], DO   5 months ago [place], DO       Future Appointments             In 1 month [place] [facility], Devonne Doughty, DO [facility] [place] within normal limits and completed in the last 12 months    WBC  Date Value Ref Range Status  04/17/2022 11.6 (H) 3.8 - 10.8 Thousand/uL Final   RBC  Date Value Ref Range Status  04/17/2022 5.13 (H) 3.80 - 5.10 Million/uL Final   Hemoglobin  Date Value Ref Range Status  04/17/2022 14.6 11.7 - 15.5 g/dL Final   HCT  Date Value Ref Range Status  04/17/2022 44.6 35.0 - 45.0 % Final   MCHC  Date Value Ref Range Status  04/17/2022 32.7 32.0 - 36.0 g/dL Final   [facility]  Date Value Ref Range Status  04/17/2022 28.5 27.0 - 33.0 pg Final   MCV  Date Value Ref Range Status  04/17/2022 86.9 80.0 - 100.0 fL Final   No results found for:  "PLTCOUNTKUC", "LABPLAT", "POCPLA" RDW  Date Value Ref Range Status  04/17/2022 12.8 11.0 - 15.0 % Final

## 2022-07-18 ENCOUNTER — Other Ambulatory Visit: Payer: Self-pay | Admitting: Internal Medicine

## 2022-07-18 ENCOUNTER — Other Ambulatory Visit (HOSPITAL_COMMUNITY): Payer: Self-pay | Admitting: *Deleted

## 2022-07-18 ENCOUNTER — Encounter (HOSPITAL_COMMUNITY): Payer: Self-pay

## 2022-07-18 DIAGNOSIS — R079 Chest pain, unspecified: Secondary | ICD-10-CM

## 2022-07-18 DIAGNOSIS — R5383 Other fatigue: Secondary | ICD-10-CM

## 2022-07-18 DIAGNOSIS — I3139 Other pericardial effusion (noninflammatory): Secondary | ICD-10-CM

## 2022-07-18 DIAGNOSIS — R9439 Abnormal result of other cardiovascular function study: Secondary | ICD-10-CM

## 2022-07-18 DIAGNOSIS — E669 Obesity, unspecified: Secondary | ICD-10-CM

## 2022-07-18 DIAGNOSIS — I259 Chronic ischemic heart disease, unspecified: Secondary | ICD-10-CM

## 2022-07-18 DIAGNOSIS — E785 Hyperlipidemia, unspecified: Secondary | ICD-10-CM

## 2022-07-18 DIAGNOSIS — G4733 Obstructive sleep apnea (adult) (pediatric): Secondary | ICD-10-CM

## 2022-07-18 DIAGNOSIS — I10 Essential (primary) hypertension: Secondary | ICD-10-CM

## 2022-07-18 MED ORDER — METOPROLOL TARTRATE 100 MG PO TABS: ORAL_TABLET | ORAL | 0 refills | Status: DC

## 2022-07-19 ENCOUNTER — Telehealth (HOSPITAL_COMMUNITY): Payer: Self-pay | Admitting: *Deleted

## 2022-07-19 NOTE — Telephone Encounter (Signed)
Reaching out to patient to offer assistance regarding upcoming cardiac imaging study; pt verbalizes understanding of appt date/time, parking situation and where to check in, pre-test NPO status and medications ordered, and verified current allergies; name and call back number provided for further questions should they arise ? ?Verlyn Lambert RN Navigator Cardiac Imaging ?[facility] ?[phone] office ?[phone] cell ? ?Patient to take 100mg metoprolol tartrate two hours prior to her cardiac CT scan.  ?

## 2022-07-20 ENCOUNTER — Ambulatory Visit
Admission: RE | Admit: 2022-07-20 | Discharge: 2022-07-20 | Disposition: A | Discharge status: Home / Self Care | Payer: Medicare Other | Source: Ambulatory Visit | Attending: Internal Medicine | Admitting: Internal Medicine

## 2022-07-20 DIAGNOSIS — I259 Chronic ischemic heart disease, unspecified: Secondary | ICD-10-CM

## 2022-07-20 DIAGNOSIS — R5383 Other fatigue: Secondary | ICD-10-CM | POA: Diagnosis present

## 2022-07-20 DIAGNOSIS — R072 Precordial pain: Secondary | ICD-10-CM

## 2022-07-20 DIAGNOSIS — R9430 Abnormal result of cardiovascular function study, unspecified: Secondary | ICD-10-CM | POA: Diagnosis not present

## 2022-07-20 DIAGNOSIS — R9439 Abnormal result of other cardiovascular function study: Secondary | ICD-10-CM | POA: Insufficient documentation

## 2022-07-20 DIAGNOSIS — R079 Chest pain, unspecified: Secondary | ICD-10-CM | POA: Diagnosis present

## 2022-07-20 LAB — POCT I-STAT CREATININE: Creatinine, Ser: 1 mg/dL (ref 0.44–1.00)

## 2022-07-20 MED ORDER — NITROGLYCERIN 0.4 MG SL SUBL
0.8000 mg | SUBLINGUAL_TABLET | Freq: Once | SUBLINGUAL | Status: AC
  Administered 2022-07-20: 0.8 mg via SUBLINGUAL

## 2022-07-20 MED ORDER — IOHEXOL 350 MG/ML SOLN
100.0000 mL | Freq: Once | INTRAVENOUS | Status: AC | PRN
  Administered 2022-07-20: 100 mL via INTRAVENOUS

## 2022-07-20 NOTE — Progress Notes (Signed)
Patient tolerated procedure well. Ambulate w/o difficulty. Denies light headedness or being dizzy. Sitting in chair drinking water provided. Encouraged to drink extra water today and reasoning explained. Verbalized understanding. All questions answered. ABC intact. No further needs. Discharge from procedure area w/o issues.   °

## 2022-08-18 ENCOUNTER — Other Ambulatory Visit: Payer: Medicare Other

## 2022-08-18 DIAGNOSIS — R7989 Other specified abnormal findings of blood chemistry: Secondary | ICD-10-CM

## 2022-08-18 DIAGNOSIS — N183 Chronic kidney disease, stage 3 unspecified: Secondary | ICD-10-CM

## 2022-08-18 DIAGNOSIS — E1169 Type 2 diabetes mellitus with other specified complication: Secondary | ICD-10-CM

## 2022-08-19 LAB — HEMOGLOBIN A1C
Hgb A1c MFr Bld: 5.8 % of total Hgb — ABNORMAL HIGH (ref <5.7)
Mean Plasma Glucose: 120 mg/dL
eAG (mmol/L): 6.6 mmol/L

## 2022-08-19 LAB — BASIC METABOLIC PANEL WITH GFR
BUN/Creatinine Ratio: 8 (calc) (ref 6–22)
BUN: 8 mg/dL (ref 7–25)
CO2: 28 mmol/L (ref 20–32)
Calcium: 9.9 mg/dL (ref 8.6–10.4)
Chloride: 107 mmol/L (ref 98–110)
Creat: 1.01 mg/dL — ABNORMAL HIGH (ref 0.60–1.00)
Glucose, Bld: 85 mg/dL (ref 65–99)
Potassium: 4.5 mmol/L (ref 3.5–5.3)
Sodium: 142 mmol/L (ref 135–146)
eGFR: 59 mL/min/{1.73_m2} — ABNORMAL LOW (ref >=60)

## 2022-08-19 LAB — T4, FREE: Free T4: 1 ng/dL (ref 0.8–1.8)

## 2022-08-19 LAB — TSH: TSH: 5.6 mIU/L — ABNORMAL HIGH (ref 0.40–4.50)

## 2022-08-25 ENCOUNTER — Ambulatory Visit (INDEPENDENT_AMBULATORY_CARE_PROVIDER_SITE_OTHER): Payer: Medicare Other | Admitting: Family Medicine

## 2022-08-25 ENCOUNTER — Other Ambulatory Visit: Payer: Self-pay | Admitting: Family Medicine

## 2022-08-25 ENCOUNTER — Encounter: Payer: Self-pay | Admitting: Family Medicine

## 2022-08-25 VITALS — BP 138/84 | HR 59 | Ht 64.0 in | Wt 200.0 lb

## 2022-08-25 DIAGNOSIS — E039 Hypothyroidism, unspecified: Secondary | ICD-10-CM

## 2022-08-25 DIAGNOSIS — R7989 Other specified abnormal findings of blood chemistry: Secondary | ICD-10-CM | POA: Diagnosis not present

## 2022-08-25 DIAGNOSIS — E1169 Type 2 diabetes mellitus with other specified complication: Secondary | ICD-10-CM | POA: Diagnosis not present

## 2022-08-25 DIAGNOSIS — F5101 Primary insomnia: Secondary | ICD-10-CM

## 2022-08-25 DIAGNOSIS — Z780 Asymptomatic menopausal state: Secondary | ICD-10-CM

## 2022-08-25 DIAGNOSIS — E538 Deficiency of other specified B group vitamins: Secondary | ICD-10-CM

## 2022-08-25 DIAGNOSIS — K219 Gastro-esophageal reflux disease without esophagitis: Secondary | ICD-10-CM

## 2022-08-25 MED ORDER — OMEPRAZOLE 40 MG PO CPDR
40.0000 mg | DELAYED_RELEASE_CAPSULE | Freq: Every day | ORAL | 1 refills | Status: DC
Start: 2022-08-25 — End: 2022-12-12

## 2022-08-25 MED ORDER — ZOLPIDEM TARTRATE ER 6.25 MG PO TBCR
6.2500 mg | EXTENDED_RELEASE_TABLET | Freq: Every evening | ORAL | 2 refills | Status: DC | PRN
Start: 2022-08-25 — End: 2022-08-25

## 2022-08-25 MED ORDER — ZOLPIDEM TARTRATE ER 6.25 MG PO TBCR
6.2500 mg | EXTENDED_RELEASE_TABLET | Freq: Every evening | ORAL | 2 refills | Status: DC | PRN
Start: 2022-08-25 — End: 2022-12-12

## 2022-08-25 MED ORDER — LEVOTHYROXINE SODIUM 25 MCG PO TABS
25.0000 ug | ORAL_TABLET | Freq: Every day | ORAL | 0 refills | Status: DC
Start: 2022-08-25 — End: 2022-12-12

## 2022-08-25 NOTE — Telephone Encounter (Signed)
Pt reports that she tried to pick up her Rx for zolpidem (AMBIEN CR) 6.25 MG CR tablet from her pharmacy but it shows as print so the pharmacy never received it. Pt requests that the Rx be sent to  [id number] - [place], [place] - [address] Phone: [phone]  Fax: [phone]

## 2022-08-25 NOTE — Progress Notes (Signed)
Subjective:    Patient ID: Kelsey Mendoza, female    DOB: [birth year], 72 y.o.   MRN: [id number]  Kelsey Mendoza is a 72 y.o. female presenting on 08/25/2022 for Medical Management of Chronic Issues   HPI  CHRONIC HYPERTENSION CKD Stage III Family History of Heart Attacks Family History Aortic Dissection Reports fam history father with MI and Aortic Dissection lethal at age 69, also older brother with CAD Multiple stents placed and CABG ECHO and functional study 2017   Last lab with Cr 1.01, improved from prior   Current Meds - Amlodipine 5mg  daily, Losartan 100mg  daily, also Furosemide 40mg  daily PRN Reports good compliance, took meds today. Tolerating well, w/o complaints. Denies CP, dyspnea, HA, edema, dizziness / lightheadedness   Type 2 Diabetes Reports concerns with A1c increasing 6.5 to 6.7 to 7.3 Older sister with possible diabetes Today lab review showed A1c down to 5.8 on therapy significant improved Meds: Mounjaro 7.5mg  weekly, Metformin XR 500mg  daily tolerating well Currently on ARB Lifestyle: Goal to improve diet - Exercise (limited) Opthalmology specialists. She has seen retina Denies hypoglycemia, polyuria, visual changes, numbness or tingling.   OSA on CPAP Using CPAP nightly with good results, benefit from therapy   Elevated TSH Lab 5.4 now up to 5.6, prior normal 3 range Low Free T4 1.0, technically still normal Never on rx medication for thyroid before. No prior diagnosis Grandson diagnosed with Hashimoto's and thyroid cancer recently She describes symptoms various with insomnia and dry skin issues.  Insomnia Chronic problem Worsening past few weeks Difficulty falling asleep, taking naps during day Additionally describes significant dry skin face and scalp, dermatitis She has followed prior Dermatology that managed her with MTX and it improved  Dry Skin / Scalp Psoriasis  Heartburn GERD Worse in PM Has bed elevated Not on  medication     Health Maintenance: Due for DEXA     08/25/2022    8:25 AM 04/20/2022    9:01 AM 01/17/2022   10:38 AM  Depression screen PHQ 2/9  Decreased Interest 2 0 0  Down, Depressed, Hopeless 0 0 0  PHQ - 2 Score 2 0 0  Altered sleeping 3 3 1   Tired, decreased energy 3 2 1   Change in appetite 0 0 1  Feeling bad or failure about yourself  1 0 0  Trouble concentrating 0 0 0  Moving slowly or fidgety/restless 0 0 0  Suicidal thoughts  0 0  PHQ-9 Score 9 5 3   Difficult doing work/chores  Not difficult at all Not difficult at all    Social History   Tobacco Use   Smoking status: Former    Types: Cigarettes    Quit date: 03/01/1983    Years since quitting: 39.5   Smokeless tobacco: Never  Vaping Use   Vaping Use: Never used  Substance Use Topics   Alcohol use: No   Drug use: No    Review of Systems Per HPI unless specifically indicated above     Objective:    BP 138/84 (BP Location: Left Arm, Cuff Size: Large)   Pulse (!) 59   Ht 5\' 4"  (1.626 m)   Wt 200 lb (90.7 kg)   SpO2 98%   BMI 34.33 kg/m   Wt Readings from Last 3 Encounters:  08/25/22 200 lb (90.7 kg)  04/20/22 221 lb (100.2 kg)  01/17/22 219 lb (99.3 kg)    Physical Exam Vitals and nursing note reviewed.  Constitutional:  General: She is not in acute distress.    Appearance: She is well-developed. She is not diaphoretic.     Comments: Well-appearing, comfortable, cooperative  HENT:     Head: Normocephalic and atraumatic.  Eyes:     General:        Right eye: No discharge.        Left eye: No discharge.     Conjunctiva/sclera: Conjunctivae normal.  Neck:     Thyroid: No thyromegaly.  Cardiovascular:     Rate and Rhythm: Normal rate and regular rhythm.     Heart sounds: Normal heart sounds. No murmur heard. Pulmonary:     Effort: Pulmonary effort is normal. No respiratory distress.     Breath sounds: Normal breath sounds. No wheezing or rales.  Musculoskeletal:        General:  Normal range of motion.     Cervical back: Normal range of motion and neck supple.  Lymphadenopathy:     Cervical: No cervical adenopathy.  Skin:    General: Skin is warm and dry.     Findings: Rash (face with some erythema appearance, dry skin scalp) present. No erythema.  Neurological:     Mental Status: She is alert and oriented to person, place, and time.  Psychiatric:        Behavior: Behavior normal.     Comments: Well groomed, good eye contact, normal speech and thoughts      Results for orders placed or performed in visit on 08/18/22  T4, free  Result Value Ref Range   Free T4 1.0 0.8 - 1.8 ng/dL  TSH  Result Value Ref Range   TSH 5.60 (H) 0.40 - 4.50 mIU/L  BASIC METABOLIC PANEL WITH GFR  Result Value Ref Range   Glucose, Bld 85 65 - 99 mg/dL   BUN 8 7 - 25 mg/dL   Creat 1.61 (H) 0.96 - 1.00 mg/dL   eGFR 59 (L) > OR = 60 mL/min/1.36m2   BUN/Creatinine Ratio 8 6 - 22 (calc)   Sodium 142 135 - 146 mmol/L   Potassium 4.5 3.5 - 5.3 mmol/L   Chloride 107 98 - 110 mmol/L   CO2 28 20 - 32 mmol/L   Calcium 9.9 8.6 - 10.4 mg/dL  Hemoglobin E4V  Result Value Ref Range   Hgb A1c MFr Bld 5.8 (H) <5.7 % of total Hgb   Mean Plasma Glucose 120 mg/dL   eAG (mmol/L) 6.6 mmol/L      Assessment & Plan:   Problem List Items Addressed This Visit     Elevated TSH   Type 2 diabetes mellitus with other specified complication (HCC) - Primary    Improved A1c to 5.8, now on Mounjaro therapy Controlled Complications - CKDIII, Hyperlipidemia, OSA  Plan:  Continue Mounjaro 7.5mg  weekly inj Discontinue Metformin  Encourage improved lifestyle - low carb, low sugar diet, reduce portion size, continue improving regular exercise 4. Check CBG, bring log to next visit for review 5. Continue ARB, Statin Urine micro      Relevant Orders   Urine microalbumin-creatinine with uACR   Hemoglobin A1c   Other Visit Diagnoses     Primary insomnia       Relevant Medications   zolpidem  (AMBIEN CR) 6.25 MG CR tablet   Postmenopausal estrogen deficiency       Relevant Orders   DG Bone Density   Acquired hypothyroidism       Relevant Medications   levothyroxine (SYNTHROID) 25 MCG tablet  Other Relevant Orders   TSH   T4, free   Thyroid peroxidase antibody   Gastroesophageal reflux disease without esophagitis       Relevant Medications   omeprazole (PRILOSEC) 40 MG capsule   Vitamin B12 deficiency       Relevant Orders   Vitamin B12       High TSH and Lower T4 Likely Hypothyroid Check antibody for Hashimoto with fam history Start Levothyroxine daily, empty stomach, wait 15-30 min before rest of meds / meal Repeat labs 3 months, can adjust  Psoriasis [facility] currently Limited treatment options due to cost and they were not able to order MTX Talk to Derm about considering MTX we can do labs here if they need. Or we can consider 2nd opinion  DEXA ordered  Insomnia  Trial the Ambien CR 6.25 - can take nightly if effective, can skip if need. This one should help sustain sleep. If side effect groggy or not effective, let me know.  GERD Take Omeprazole 40mg  daily before breakfast for stomach acid.  Orders Placed This Encounter  Procedures   DG Bone Density    Standing Status:   Future    Standing Expiration Date:   08/25/2023    Order Specific Question:   Reason for Exam (SYMPTOM  OR DIAGNOSIS REQUIRED)    Answer:   osteoporosis screening    Order Specific Question:   Preferred imaging location?    Answer:   [facility]   Urine microalbumin-creatinine with uACR   TSH    Standing Status:   Future    Standing Expiration Date:   04/10/2023   T4, free    Standing Status:   Future    Standing Expiration Date:   04/10/2023   Thyroid peroxidase antibody    Standing Status:   Future    Standing Expiration Date:   04/10/2023   Hemoglobin A1c    Standing Status:   Future    Standing Expiration Date:   04/10/2023   Vitamin B12     Standing Status:   Future    Standing Expiration Date:   08/25/2023     Meds ordered this encounter  Medications   levothyroxine (SYNTHROID) 25 MCG tablet    Sig: Take 1 tablet (25 mcg total) by mouth daily before breakfast.    Dispense:  90 tablet    Refill:  0   DISCONTD: zolpidem (AMBIEN CR) 6.25 MG CR tablet    Sig: Take 1 tablet (6.25 mg total) by mouth at bedtime as needed for sleep.    Dispense:  30 tablet    Refill:  2   omeprazole (PRILOSEC) 40 MG capsule    Sig: Take 1 capsule (40 mg total) by mouth daily before breakfast.    Dispense:  90 capsule    Refill:  1   zolpidem (AMBIEN CR) 6.25 MG CR tablet    Sig: Take 1 tablet (6.25 mg total) by mouth at bedtime as needed for sleep.    Dispense:  30 tablet    Refill:  2      Follow up plan: Return in about 3 months (around 11/25/2022) for 3 months fasting lab only then 1 week later Follow-up Thyroid, DM.  Future labs ordered for Thyroid panel and A1c repeat   Saralyn Pilar, DO [facility] 08/25/2022, 8:13 AM

## 2022-08-25 NOTE — Assessment & Plan Note (Signed)
Improved A1c to 5.8, now on Mounjaro therapy Controlled Complications - CKDIII, Hyperlipidemia, OSA  Plan:  Continue Mounjaro 7.5mg  weekly inj Discontinue Metformin  Encourage improved lifestyle - low carb, low sugar diet, reduce portion size, continue improving regular exercise 4. Check CBG, bring log to next visit for review 5. Continue ARB, Statin Urine micro

## 2022-08-25 NOTE — Telephone Encounter (Signed)
Resent to pharmacy  Saralyn Pilar, DO [facility] 08/25/2022, 6:57 PM

## 2022-08-25 NOTE — Patient Instructions (Addendum)
Thank you for coming to the office today.  High TSH and Lower T4 Likely Hypothyroid Start Levothyroxine daily, empty stomach, wait 15-30 min before rest of meds / meal Repeat labs 3 months, can adjust  Talk to Derm about considering MTX we can do labs here if they need. Or we can consider 2nd opinion  Recent Labs    04/17/22 0810 08/18/22 0811  HGBA1C 7.3* 5.8*   Excellent A1c Keep Mounjaro 7.5, future can inc to 10 Stop Metformin  For DEXA Scan (Bone mineral density) screening for osteoporosis  Call the Imaging Center below anytime to schedule your own appointment now that order has been placed.  [facility] [address] [place], [place] [address] Phone: [phone]  -----------------------------------  Urine test today for kidney health  Trial the Ambien CR 6.25 - can take nightly if effective, can skip if need. This one should help sustain sleep. If side effect groggy or not effective, let me know.  Take Omeprazole 40mg  daily before breakfast for stomach acid.   DUE for FASTING BLOOD WORK (no food or drink after midnight before the lab appointment, only water or coffee without cream/sugar on the morning of)  SCHEDULE "Lab Only" visit in the morning at the clinic for lab draw in 10 WEEKS   - Make sure Lab Only appointment is at about 1 week before your next appointment, so that results will be available  For Lab Results, once available within 2-3 days of blood draw, you can can log in to MyChart online to view your results and a brief explanation. Also, we can discuss results at next follow-up visit.   Please schedule a Follow-up Appointment to: Return in about 3 months (around 11/25/2022) for 3 months fasting lab only then 1 week later Follow-up Thyroid, DM.  If you have any other questions or concerns, please feel free to call the office or send a message through MyChart. You may also schedule an earlier appointment  if necessary.  Additionally, you may be receiving a survey about your experience at our office within a few days to 1 week by e-mail or mail. We value your feedback.  Saralyn Pilar, DO [facility], [place]

## 2022-08-26 LAB — MICROALBUMIN / CREATININE URINE RATIO
Creatinine, Urine: 95 mg/dL (ref 20–275)
Microalb Creat Ratio: 9 mg/g creat (ref <30)
Microalb, Ur: 0.9 mg/dL

## 2022-08-28 ENCOUNTER — Telehealth: Payer: Self-pay

## 2022-08-28 NOTE — Telephone Encounter (Signed)
Fax from pharmacy stating Kelsey Mendoza was not covered by insurance.  Patient was able to pay out of pocket using a savings card for this medication.

## 2022-08-31 ENCOUNTER — Ambulatory Visit: Payer: Self-pay | Admitting: *Deleted

## 2022-08-31 ENCOUNTER — Ambulatory Visit: Payer: Self-pay

## 2022-08-31 NOTE — Telephone Encounter (Signed)
3rd attempt, Patient called, left VM to return the call to the office to discuss symptoms/medication with a nurse.  Summary: medication concerns   Pt called saying the levothyroxine that she just started on is making her dizzy and having some headaches she thinks it is the medication  CB#  [phone]

## 2022-08-31 NOTE — Telephone Encounter (Signed)
  Summary: medication concerns    Pt called saying the levothyroxine that she just started on is making her dizzy and having some headaches she thinks it is the medication  CB#  [phone]           Chief Complaint: Headache, dizziness Symptoms: Reports mild headache, mild lightheadedness after starting Levothyroxine States Started SAturday, Sunday had  mild headache, dizzy by Monday. Frequency: Sunday Pertinent Negatives: Patient denies  Disposition: [] ED /[] Urgent Care (no appt availability in office) / [] Appointment(In office/virtual)/ []  [facility]/ [] Home Care/ [] Refused Recommended Disposition /[] [facility]/ [x]  Follow-up with PCP Additional Notes: Pt states she thinks from new med Levothyroxine. States symptoms started shortly after. Assured pt NT would route to practice for PCPs review. Please advise.  Reason for Disposition  [1] MILD-MODERATE headache AND [2] present > 72 hours  Answer Assessment - Initial Assessment Questions 1. LOCATION: "Where does it hurt?"      All over 2. ONSET: "When did the headache start?" (Minutes, hours or days)      Mild Sunday  dizzy monday 3. PATTERN: "Does the pain come and go, or has it been constant since it started?"      4. SEVERITY: "How bad is the pain?" and "What does it keep you from doing?"  (e.g., Scale 1-10; mild, moderate, or severe)   - MILD (1-3): doesn't interfere with normal activities    - MODERATE (4-7): interferes with normal activities or awakens from sleep    - SEVERE (8-10): excruciating pain, unable to do any normal activities        Mild 5. RECURRENT SYMPTOM: "Have you ever had headaches before?" If Yes, ask: "When was the last time?" and "What happened that time?"       6. CAUSE: "What do you think is causing the headache?"     medication 7. MIGRAINE: "Have you been diagnosed with migraine headaches?" If Yes, ask: "Is this headache similar?"       8. HEAD INJURY: "Has there been any  recent injury to the head?"       9. OTHER SYMPTOMS: "Do you have any other symptoms?" (fever, stiff neck, eye pain, sore throat, cold symptoms)     Lightheadedness, mild  Protocols used: Headache-A-AH

## 2022-08-31 NOTE — Telephone Encounter (Signed)
Message from Otis Brace sent at 08/31/2022  3:15 PM EDT  Summary: medication concerns   Pt called saying the levothyroxine that she just started on is making her dizzy and having some headaches she thinks it is the medication  CB#  [phone]        Called pt but VM not set up, unable to LM.

## 2022-08-31 NOTE — Telephone Encounter (Signed)
Message from Otis Brace sent at 08/31/2022  3:15 PM EDT  Summary: medication concerns   Pt called saying the levothyroxine that she just started on is making her dizzy and having some headaches she thinks it is the medication  CB#  [phone]        Second attempt to make contact with pt- VM not set up.

## 2022-09-01 NOTE — Telephone Encounter (Signed)
FYI....  Pt advised.  She is cutting the pill in half for now to see if that helps.  She is going to call back next week if she is not better.    Thanks,   -Vernona Rieger

## 2022-09-01 NOTE — Telephone Encounter (Signed)
The side effects often improve with time.  In the meantime, I would recommend that she drink adequate amounts of water and make position changes slowly.  If symptoms persist or worsen, notify PCP next week.

## 2022-09-07 LAB — HM DIABETES EYE EXAM

## 2022-09-15 ENCOUNTER — Ambulatory Visit (INDEPENDENT_AMBULATORY_CARE_PROVIDER_SITE_OTHER): Payer: Medicare Other

## 2022-09-15 VITALS — Ht 64.0 in | Wt 200.0 lb

## 2022-09-15 DIAGNOSIS — Z0000 Encounter for general adult medical examination without abnormal findings: Secondary | ICD-10-CM

## 2022-09-15 NOTE — Patient Instructions (Signed)
Kelsey Mendoza , Thank you for taking time to come for your Medicare Wellness Visit. I appreciate your ongoing commitment to your health goals. Please review the following plan we discussed and let me know if I can assist you in the future.   These are the goals we discussed:  Goals      DIET - EAT MORE FRUITS AND VEGETABLES        This is a list of the screening recommended for you and due dates:  Health Maintenance  Topic Date Due   COVID-19 Vaccine (1) Never done   Complete foot exam   Never done   DEXA scan (bone density measurement)  Never done   Colon Cancer Screening  08/25/2023*   Hepatitis C Screening  08/25/2023*   Flu Shot  11/09/2022   Hemoglobin A1C  02/18/2023   Yearly kidney function blood test for diabetes  08/18/2023   Yearly kidney health urinalysis for diabetes  08/25/2023   Eye exam for diabetics  09/07/2023   Medicare Annual Wellness Visit  09/15/2023   Mammogram  01/06/2024   DTaP/Tdap/Td vaccine (2 - Td or Tdap) 06/28/2031   Pneumonia Vaccine  Completed   Zoster (Shingles) Vaccine  Completed   HPV Vaccine  Aged Out  *Topic was postponed. The date shown is not the original due date.    Advanced directives: no  Conditions/risks identified: none  Next appointment: Follow up in one year for your annual wellness visit 09/21/23 @ 1:30 pm by phone   Preventive Care 65 Years and Older, Female Preventive care refers to lifestyle choices and visits with your health care provider that can promote health and wellness. What does preventive care include? A yearly physical exam. This is also called an annual well check. Dental exams once or twice a year. Routine eye exams. Ask your health care provider how often you should have your eyes checked. Personal lifestyle choices, including: Daily care of your teeth and gums. Regular physical activity. Eating a healthy diet. Avoiding tobacco and drug use. Limiting alcohol use. Practicing safe sex. Taking low-dose  aspirin every day. Taking vitamin and mineral supplements as recommended by your health care provider. What happens during an annual well check? The services and screenings done by your health care provider during your annual well check will depend on your age, overall health, lifestyle risk factors, and family history of disease. Counseling  Your health care provider may ask you questions about your: Alcohol use. Tobacco use. Drug use. Emotional well-being. Home and relationship well-being. Sexual activity. Eating habits. History of falls. Memory and ability to understand (cognition). Work and work Astronomer. Reproductive health. Screening  You may have the following tests or measurements: Height, weight, and BMI. Blood pressure. Lipid and cholesterol levels. These may be checked every 5 years, or more frequently if you are over 46 years old. Skin check. Lung cancer screening. You may have this screening every year starting at age 49 if you have a 30-pack-year history of smoking and currently smoke or have quit within the past 15 years. Fecal occult blood test (FOBT) of the stool. You may have this test every year starting at age 81. Flexible sigmoidoscopy or colonoscopy. You may have a sigmoidoscopy every 5 years or a colonoscopy every 10 years starting at age 61. Hepatitis C blood test. Hepatitis B blood test. Sexually transmitted disease (STD) testing. Diabetes screening. This is done by checking your blood sugar (glucose) after you have not eaten for a while (fasting). You  may have this done every 1-3 years. Bone density scan. This is done to screen for osteoporosis. You may have this done starting at age 79. Mammogram. This may be done every 1-2 years. Talk to your health care provider about how often you should have regular mammograms. Talk with your health care provider about your test results, treatment options, and if necessary, the need for more tests. Vaccines  Your  health care provider may recommend certain vaccines, such as: Influenza vaccine. This is recommended every year. Tetanus, diphtheria, and acellular pertussis (Tdap, Td) vaccine. You may need a Td booster every 10 years. Zoster vaccine. You may need this after age 74. Pneumococcal 13-valent conjugate (PCV13) vaccine. One dose is recommended after age 67. Pneumococcal polysaccharide (PPSV23) vaccine. One dose is recommended after age 61. Talk to your health care provider about which screenings and vaccines you need and how often you need them. This information is not intended to replace advice given to you by your health care provider. Make sure you discuss any questions you have with your health care provider. Document Released: 04/23/2015 Document Revised: 12/15/2015 Document Reviewed: 01/26/2015 Elsevier Interactive Patient Education  2017 [place] Prevention in the Home Falls can cause injuries. They can happen to people of all ages. There are many things you can do to make your home safe and to help prevent falls. What can I do on the outside of my home? Regularly fix the edges of walkways and driveways and fix any cracks. Remove anything that might make you trip as you walk through a door, such as a raised step or threshold. Trim any bushes or trees on the path to your home. Use bright outdoor lighting. Clear any walking paths of anything that might make someone trip, such as rocks or tools. Regularly check to see if handrails are loose or broken. Make sure that both sides of any steps have handrails. Any raised decks and porches should have guardrails on the edges. Have any leaves, snow, or ice cleared regularly. Use sand or salt on walking paths during winter. Clean up any spills in your garage right away. This includes oil or grease spills. What can I do in the bathroom? Use night lights. Install grab bars by the toilet and in the tub and shower. Do not use towel bars as  grab bars. Use non-skid mats or decals in the tub or shower. If you need to sit down in the shower, use a plastic, non-slip stool. Keep the floor dry. Clean up any water that spills on the floor as soon as it happens. Remove soap buildup in the tub or shower regularly. Attach bath mats securely with double-sided non-slip rug tape. Do not have throw rugs and other things on the floor that can make you trip. What can I do in the bedroom? Use night lights. Make sure that you have a light by your bed that is easy to reach. Do not use any sheets or blankets that are too big for your bed. They should not hang down onto the floor. Have a firm chair that has side arms. You can use this for support while you get dressed. Do not have throw rugs and other things on the floor that can make you trip. What can I do in the kitchen? Clean up any spills right away. Avoid walking on wet floors. Keep items that you use a lot in easy-to-reach places. If you need to reach something above you, use a strong  step stool that has a grab bar. Keep electrical cords out of the way. Do not use floor polish or wax that makes floors slippery. If you must use wax, use non-skid floor wax. Do not have throw rugs and other things on the floor that can make you trip. What can I do with my stairs? Do not leave any items on the stairs. Make sure that there are handrails on both sides of the stairs and use them. Fix handrails that are broken or loose. Make sure that handrails are as long as the stairways. Check any carpeting to make sure that it is firmly attached to the stairs. Fix any carpet that is loose or worn. Avoid having throw rugs at the top or bottom of the stairs. If you do have throw rugs, attach them to the floor with carpet tape. Make sure that you have a light switch at the top of the stairs and the bottom of the stairs. If you do not have them, ask someone to add them for you. What else can I do to help prevent  falls? Wear shoes that: Do not have high heels. Have rubber bottoms. Are comfortable and fit you well. Are closed at the toe. Do not wear sandals. If you use a stepladder: Make sure that it is fully opened. Do not climb a closed stepladder. Make sure that both sides of the stepladder are locked into place. Ask someone to hold it for you, if possible. Clearly mark and make sure that you can see: Any grab bars or handrails. First and last steps. Where the edge of each step is. Use tools that help you move around (mobility aids) if they are needed. These include: Canes. Walkers. Scooters. Crutches. Turn on the lights when you go into a dark area. Replace any light bulbs as soon as they burn out. Set up your furniture so you have a clear path. Avoid moving your furniture around. If any of your floors are uneven, fix them. If there are any pets around you, be aware of where they are. Review your medicines with your doctor. Some medicines can make you feel dizzy. This can increase your chance of falling. Ask your doctor what other things that you can do to help prevent falls. This information is not intended to replace advice given to you by your health care provider. Make sure you discuss any questions you have with your health care provider. Document Released: 01/21/2009 Document Revised: 09/02/2015 Document Reviewed: 05/01/2014 Elsevier Interactive Patient Education  2017 [employer].

## 2022-09-15 NOTE — Progress Notes (Signed)
I connected with  Kelsey Mendoza on 09/15/22 by a audio enabled telemedicine application and verified that I am speaking with the correct person using two identifiers.  Patient Location: Home  Provider Location: Office/Clinic  I discussed the limitations of evaluation and management by telemedicine. The patient expressed understanding and agreed to proceed.  Subjective:   Kelsey Mendoza is a 72 y.o. female who presents for an Initial Medicare Annual Wellness Visit.  Review of Systems     Cardiac Risk Factors include: advanced age (>62men, >25 women);diabetes mellitus;dyslipidemia;hypertension, obesity     Objective:    Today's Vitals   09/15/22 1550  Weight: 200 lb (90.7 kg)  Height: 5\' 4"  (1.626 m)   Body mass index is 34.33 kg/m.     09/15/2022    3:40 PM 07/28/2021   12:10 PM 06/27/2021   11:44 AM 05/27/2021    7:03 AM 03/20/2021    8:37 AM 08/29/2020    8:38 AM 10/06/2019    2:31 PM  Advanced Directives  Does Patient Have a Medical Advance Directive? No No No Yes Yes No Yes  Type of Theme park manager;Living will Healthcare Power of [place];Living will  Healthcare Power of [place];Living will  Does patient want to make changes to medical advance directive?    No - Patient declined   No - Patient declined  Copy of Healthcare Power of Attorney in Chart?    No - copy requested   No - copy requested  Would patient like information on creating a medical advance directive? No - Patient declined          Current Medications (verified) Outpatient Encounter Medications as of 09/15/2022  Medication Sig   amLODipine (NORVASC) 5 MG tablet Take 5 mg by mouth daily.   furosemide (LASIX) 40 MG tablet Take 40 mg by mouth daily as needed for fluid.   gabapentin (NEURONTIN) 100 MG capsule Take 100 mg by mouth at bedtime.   MOUNJARO 7.5 MG/0.5ML Pen Inject 7.5 mg into the skin once a week.   rosuvastatin (CRESTOR) 5 MG tablet TAKE 1 TABLET BY MOUTH  EVERY DAY   levothyroxine (SYNTHROID) 25 MCG tablet Take 1 tablet (25 mcg total) by mouth daily before breakfast.   losartan (COZAAR) 100 MG tablet Take 100 mg by mouth every evening.   omeprazole (PRILOSEC) 40 MG capsule Take 1 capsule (40 mg total) by mouth daily before breakfast.   zolpidem (AMBIEN CR) 6.25 MG CR tablet Take 1 tablet (6.25 mg total) by mouth at bedtime as needed for sleep.   No facility-administered encounter medications on file as of 09/15/2022.    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   CPAP (continuous positive airway pressure) dependence    HLD (hyperlipidemia)    Hypertension    Knee pain    Pre-diabetes    Psoriasis of scalp    Sleep apnea    Past Surgical History:  Procedure Laterality Date   ABDOMINAL HYSTERECTOMY     BACK SURGERY     BLADDER SURGERY     BROW LIFT Bilateral 05/27/2021   Procedure: BLEPHAROPLASTY UPPER EYELID; W/EXCESS SKIN AND BROW PTOSIS REPAIR BILATERAL;  Surgeon: Imagene Riches, MD;  Location: [facility];  Service: Ophthalmology;  Laterality: Bilateral;   CARPAL TUNNEL RELEASE Right 06/06/2017   Procedure: CARPAL TUNNEL RELEASE;  Surgeon: Juanell Fairly, MD;  Location: [facility];  Service: Orthopedics;  Laterality: Right;   EYE SURGERY Left 11/26/2019   Dr. Luciana Axe, Vit, Mem Glynda Jaeger   I & D EXTREMITY Right 06/06/2017   Procedure: IRRIGATION AND DEBRIDEMENT EXTREMITY--RIGHT ARM;  Surgeon: Juanell Fairly, MD;  Location: [facility];  Service: Orthopedics;  Laterality: Right;   KNEE ARTHROSCOPY Left 10/06/2019   Procedure: ARTHROSCOPY KNEE,PARTIAL MEDIAL MENISECTOMY,PARTIAL CHONDROPLASTY;  Surgeon: Donato Heinz, MD;  Location: [facility];  Service: Orthopedics;  Laterality: Left;   KNEE SURGERY     OPEN REDUCTION INTERNAL FIXATION (ORIF) DISTAL RADIAL FRACTURE N/A 06/06/2017   Procedure: OPEN REDUCTION INTERNAL FIXATION (ORIF) DISTAL RADIAL FRACTURE;  Surgeon:  Juanell Fairly, MD;  Location: [facility];  Service: Orthopedics;  Laterality: N/A;   Family History  Problem Relation Age of Onset   Hypertension Mother    Hypertension Father    CAD Father    Breast cancer Neg Hx    Social History   Socioeconomic History   Marital status: Widowed    Spouse name: Not on file   Number of children: Not on file   Years of education: Not on file   Highest education level: Associate degree: occupational, Scientist, product/process development, or vocational program  Occupational History   Not on file  Tobacco Use   Smoking status: Former    Types: Cigarettes    Quit date: 03/01/1983    Years since quitting: 39.5   Smokeless tobacco: Never  Vaping Use   Vaping Use: Never used  Substance and Sexual Activity   Alcohol use: No   Drug use: No   Sexual activity: Not on file  Other Topics Concern   Not on file  Social History Narrative   Not on file   Social Determinants of Health   Financial Resource Strain: Low Risk  (09/15/2022)   Overall Financial Resource Strain (CARDIA)    Difficulty of Paying Living Expenses: Not hard at all  Food Insecurity: No Food Insecurity (09/15/2022)   Hunger Vital Sign    Worried About Running Out of Food in the Last Year: Never true    Ran Out of Food in the Last Year: Never true  Transportation Needs: No Transportation Needs (09/15/2022)   PRAPARE - Administrator, Civil Service (Medical): No    Lack of Transportation (Non-Medical): No  Physical Activity: Sufficiently Active (09/15/2022)   Exercise Vital Sign    Days of Exercise per Week: 4 days    Minutes of Exercise per Session: 40 min  Stress: No Stress Concern Present (09/15/2022)   [employer] of Occupational Health - Occupational Stress Questionnaire    Feeling of Stress : Not at all  Social Connections: Socially Isolated (09/15/2022)   Social Connection and Isolation Panel [NHANES]    Frequency of Communication with Friends and Family: More than three times a week     Frequency of Social Gatherings with Friends and Family: More than three times a week    Attends Religious Services: Never    Database administrator or Organizations: No    Attends Banker Meetings: Never    Marital Status: Widowed    Tobacco Counseling Counseling given: Not Answered   Clinical Intake:  Pre-visit preparation completed: Yes  Pain : No/denies pain     Nutritional Risks: None Diabetes: Yes CBG done?: No Did pt. bring in CBG monitor from home?: No  How often do you need to have someone help you when you read instructions, pamphlets, or other written materials  from your doctor or pharmacy?: 1 - Never  Diabetic?yes Nutrition Risk Assessment:  Has the patient had any N/V/D within the last 2 months?  No  Does the patient have any non-healing wounds?  No  Has the patient had any unintentional weight loss or weight gain?  No   Diabetes:  Is the patient diabetic?  Yes  If diabetic, was a CBG obtained today?  No  Did the patient bring in their glucometer from home?  No  How often do you monitor your CBG's? never.   Financial Strains and Diabetes Management:  Are you having any financial strains with the device, your supplies or your medication? No .  Does the patient want to be seen by Chronic Care Management for management of their diabetes?  No  Would the patient like to be referred to a Nutritionist or for Diabetic Management?  No   Diabetic Exams:  Diabetic Eye Exam: Completed 09/07/22. Pt has been advised about the importance in completing this exam.   Diabetic Foot Exam: Completed no. Pt has been advised about the importance in completing this exam.   Interpreter Needed?: No  Information entered by :: Kennedy Bucker, LPN   Activities of Daily Living    09/15/2022    3:41 PM 09/11/2022    9:31 AM  In your present state of health, do you have any difficulty performing the following activities:  Hearing? 0 0  Vision? 0 0  Difficulty  concentrating or making decisions? 0 0  Walking or climbing stairs? 0 0  Dressing or bathing? 0 0  Doing errands, shopping? 0 0  Preparing Food and eating ? N N  Using the Toilet? N N  In the past six months, have you accidently leaked urine? N N  Do you have problems with loss of bowel control? N N  Managing your Medications? N N  Managing your Finances? N N  Housekeeping or managing your Housekeeping? N N    Patient Care Team: Smitty Cords, DO as PCP - General (Family Medicine) Antony Contras, MD as Consulting Physician (Ophthalmology) [facility]  Indicate any recent Medical Services you may have received from other than [facility] providers in the past year (date may be approximate).     Assessment:   This is a routine wellness examination for [place].  Hearing/Vision screen Hearing Screening - Comments:: No aids Vision Screening - Comments:: Wears glasses- Dr.Lyles  Dietary issues and exercise activities discussed: Current Exercise Habits: Home exercise routine, Type of exercise: walking, Time (Minutes): 40, Frequency (Times/Week): 4, Weekly Exercise (Minutes/Week): 160, Intensity: Mild   Goals Addressed             This Visit's Progress    DIET - EAT MORE FRUITS AND VEGETABLES         Depression Screen    09/15/2022    3:38 PM 08/25/2022    8:25 AM 04/20/2022    9:01 AM 01/17/2022   10:38 AM  PHQ 2/9 Scores  PHQ - 2 Score 0 2 0 0  PHQ- 9 Score 0 9 5 3     Fall Risk    09/15/2022    3:40 PM 09/11/2022    9:31 AM 08/25/2022    8:25 AM 04/20/2022    9:01 AM 01/17/2022   10:37 AM  Fall Risk   Falls in the past year? 0 0 0 0 0  Number falls in past yr: 0   0 0  Injury with Fall? 0  0 0  Risk for fall due to : No Fall Risks   No Fall Risks No Fall Risks  Follow up Falls prevention discussed;Falls evaluation completed   Falls evaluation completed Falls evaluation completed    FALL RISK PREVENTION PERTAINING TO THE HOME:  Any  stairs in or around the home? No  If so, are there any without handrails? No  Home free of loose throw rugs in walkways, pet beds, electrical cords, etc? Yes  Adequate lighting in your home to reduce risk of falls? Yes   ASSISTIVE DEVICES UTILIZED TO PREVENT FALLS:  Life alert? No  Use of a cane, walker or w/c? No  Grab bars in the bathroom? Yes  Shower chair or bench in shower? Yes  Elevated toilet seat or a handicapped toilet? No    Cognitive Function:        09/15/2022    3:47 PM  6CIT Screen  What Year? 0 points  What month? 0 points  What time? 0 points  Count back from 20 0 points  Months in reverse 0 points  Repeat phrase 0 points  Total Score 0 points    Immunizations Immunization History  Administered Date(s) Administered   Influenza, High Dose Seasonal PF 06/07/2017, 11/18/2018, 11/18/2018, 01/19/2020   Influenza-Unspecified 01/16/2013, 02/04/2015, 03/20/2016   Pneumococcal Conjugate-13 01/19/2020   Pneumococcal Polysaccharide-23 02/04/2015, 06/07/2017   Td (Adult),unspecified 07/18/2013   Tdap 06/27/2021   Zoster Recombinat (Shingrix) 11/18/2018, 03/20/2019    TDAP status: Up to date  Flu Vaccine status: Declined, Education has been provided regarding the importance of this vaccine but patient still declined. Advised may receive this vaccine at local pharmacy or Health Dept. Aware to provide a copy of the vaccination record if obtained from local pharmacy or Health Dept. Verbalized acceptance and understanding.  Pneumococcal vaccine status: Up to date  Covid-19 vaccine status: Declined, Education has been provided regarding the importance of this vaccine but patient still declined. Advised may receive this vaccine at local pharmacy or Health Dept.or vaccine clinic. Aware to provide a copy of the vaccination record if obtained from local pharmacy or Health Dept. Verbalized acceptance and understanding.  Qualifies for Shingles Vaccine? Yes   Zostavax  completed No   Shingrix Completed?: Yes  Screening Tests Health Maintenance  Topic Date Due   COVID-19 Vaccine (1) Never done   FOOT EXAM  Never done   DEXA SCAN  Never done   Colonoscopy  08/25/2023 (Originally [birth year]1994)   Hepatitis C Screening  08/25/2023 (Originally 06/06/1968)   INFLUENZA VACCINE  11/09/2022   HEMOGLOBIN A1C  02/18/2023   Diabetic kidney evaluation - eGFR measurement  08/18/2023   Diabetic kidney evaluation - Urine ACR  08/25/2023   OPHTHALMOLOGY EXAM  09/07/2023   Medicare Annual Wellness (AWV)  09/15/2023   MAMMOGRAM  01/06/2024   DTaP/Tdap/Td (2 - Td or Tdap) 06/28/2031   Pneumonia Vaccine 15+ Years old  Completed   Zoster Vaccines- Shingrix  Completed   HPV VACCINES  Aged Out    Health Maintenance  Health Maintenance Due  Topic Date Due   COVID-19 Vaccine (1) Never done   FOOT EXAM  Never done   DEXA SCAN  Never done    Colorectal cancer screening: Type of screening: Colonoscopy. Completed POSTPONED UNTIL 08/25/23. Repeat every 10 years  Mammogram status: Completed 01/05/22. Repeat every year  Bone Density status: Ordered 08/25/22. Pt provided with contact info and advised to call to schedule appt.  Lung Cancer Screening: (Low Dose CT  Chest recommended if Age 28-80 years, 30 pack-year currently smoking OR have quit w/in 15years.) does not qualify.    Additional Screening:  Hepatitis C Screening: does qualify; Completed NO  Vision Screening: Recommended annual ophthalmology exams for early detection of glaucoma and other disorders of the eye. Is the patient up to date with their annual eye exam?  Yes  Who is the provider or what is the name of the office in which the patient attends annual eye exams? Dr.Lyles If pt is not established with a provider, would they like to be referred to a provider to establish care? No .   Dental Screening: Recommended annual dental exams for proper oral hygiene  Community Resource Referral / Chronic Care  Management: CRR required this visit?  No   CCM required this visit?  No      Plan:     I have personally reviewed and noted the following in the patient's chart:   Medical and social history Use of alcohol, tobacco or illicit drugs  Current medications and supplements including opioid prescriptions. Patient is not currently taking opioid prescriptions. Functional ability and status Nutritional status Physical activity Advanced directives List of other physicians Hospitalizations, surgeries, and ER visits in previous 12 months Vitals Screenings to include cognitive, depression, and falls Referrals and appointments  In addition, I have reviewed and discussed with patient certain preventive protocols, quality metrics, and best practice recommendations. A written personalized care plan for preventive services as well as general preventive health recommendations were provided to patient.     Hal Hope, LPN   0/12/8117   Nurse Notes: none

## 2022-10-02 ENCOUNTER — Other Ambulatory Visit: Payer: Self-pay | Admitting: Family Medicine

## 2022-10-02 DIAGNOSIS — E1169 Type 2 diabetes mellitus with other specified complication: Secondary | ICD-10-CM

## 2022-10-03 NOTE — Telephone Encounter (Signed)
Requested medications are due for refill today.  yes  Requested medications are on the active medications list.  yes  Last refill. 07/10/2022 2mL 2 rf  Future visit scheduled.   yes  Notes to clinic.  Medication not assigned to a protocol. Please review for refill.    Requested Prescriptions  Pending Prescriptions Disp Refills   MOUNJARO 7.5 MG/0.5ML Pen [Pharmacy Med Name: MOUNJARO 7.5 MG/0.5 ML PEN]  2    Sig: INJECT 7.5 MG SUBCUTANEOUSLY WEEKLY     Off-Protocol Failed - 10/02/2022  2:34 AM      Failed - Medication not assigned to a protocol, review manually.      Passed - Valid encounter within last 12 months    Recent Outpatient Visits           1 month ago Type 2 diabetes mellitus with other specified complication, without long-term current use of insulin [facility])   [facility] [place], Netta Neat, DO   5 months ago Type 2 diabetes mellitus with other specified complication, without long-term current use of insulin [facility])   [facility] Smitty Cords, DO   8 months ago Pre-diabetes   [facility] Smitty Cords, DO       Future Appointments             In 2 months Althea Charon, Netta Neat, DO [facility], [facility]

## 2022-10-12 ENCOUNTER — Other Ambulatory Visit: Payer: Self-pay | Admitting: Family Medicine

## 2022-10-12 DIAGNOSIS — R7303 Prediabetes: Secondary | ICD-10-CM

## 2022-10-13 NOTE — Telephone Encounter (Signed)
Dc'd by PCP 08/25/22 (2nd refusal)  Requested Prescriptions  Refused Prescriptions Disp Refills   metFORMIN (GLUCOPHAGE-XR) 500 MG 24 hr tablet [Pharmacy Med Name: METFORMIN HCL ER 500 MG TABLET] 90 tablet 0    Sig: TAKE 1 TABLET BY MOUTH DAILY WITH SUPPER     Endocrinology:  Diabetes - Biguanides Failed - 10/12/2022  2:32 AM      Failed - Cr in normal range and within 360 days    Creat  Date Value Ref Range Status  08/18/2022 1.01 (H) 0.60 - 1.00 mg/dL Final   Creatinine, Urine  Date Value Ref Range Status  08/25/2022 95 20 - 275 mg/dL Final         Failed - eGFR in normal range and within 360 days    GFR calc Af Amer  Date Value Ref Range Status  06/07/2017 >60 >60 mL/min Final    Comment:    (NOTE) The eGFR has been calculated using the CKD EPI equation. This calculation has not been validated in all clinical situations. eGFR's persistently <60 mL/min signify possible Chronic Kidney Disease.    GFR calc non Af Amer  Date Value Ref Range Status  06/07/2017 >60 >60 mL/min Final   eGFR  Date Value Ref Range Status  08/18/2022 59 (L) > OR = 60 mL/min/1.10m2 Final         Failed - B12 Level in normal range and within 720 days    No results found for: "VITAMINB12"       Passed - HBA1C is between 0 and 7.9 and within 180 days    Hgb A1c MFr Bld  Date Value Ref Range Status  08/18/2022 5.8 (H) <5.7 % of total Hgb Final    Comment:    For someone without known diabetes, a hemoglobin  A1c value between 5.7% and 6.4% is consistent with prediabetes and should be confirmed with a  follow-up test. . For someone with known diabetes, a value <7% indicates that their diabetes is well controlled. A1c targets should be individualized based on duration of diabetes, age, comorbid conditions, and other considerations. . This assay result is consistent with an increased risk of diabetes. . Currently, no consensus exists regarding use of hemoglobin A1c for diagnosis of diabetes  for children. Verna Czech - Valid encounter within last 6 months    Recent Outpatient Visits           1 month ago Type 2 diabetes mellitus with other specified complication, without long-term current use of insulin [facility])   [place] [facility] [place], Netta Neat, DO   5 months ago Type 2 diabetes mellitus with other specified complication, without long-term current use of insulin [facility])   [place] Smitty Cords, DO   8 months ago Pre-diabetes   [facility] Althea Charon, Netta Neat, DO       Future Appointments             In 1 month Althea Charon, Netta Neat, DO [place] [facility]            Passed - CBC within normal limits and completed in the last 12 months    WBC  Date Value Ref Range Status  04/17/2022 11.6 (H) 3.8 - 10.8 Thousand/uL Final   RBC  Date Value Ref Range Status  04/17/2022 5.13 (H) 3.80 - 5.10 Million/uL Final  Hemoglobin  Date Value Ref Range Status  04/17/2022 14.6 11.7 - 15.5 g/dL Final   HCT  Date Value Ref Range Status  04/17/2022 44.6 35.0 - 45.0 % Final   MCHC  Date Value Ref Range Status  04/17/2022 32.7 32.0 - 36.0 g/dL Final   [facility]  Date Value Ref Range Status  04/17/2022 28.5 27.0 - 33.0 pg Final   MCV  Date Value Ref Range Status  04/17/2022 86.9 80.0 - 100.0 fL Final   No results found for: "PLTCOUNTKUC", "LABPLAT", "POCPLA" RDW  Date Value Ref Range Status  04/17/2022 12.8 11.0 - 15.0 % Final

## 2022-11-03 ENCOUNTER — Telehealth: Payer: Self-pay | Admitting: Family Medicine

## 2022-11-03 DIAGNOSIS — E1169 Type 2 diabetes mellitus with other specified complication: Secondary | ICD-10-CM

## 2022-11-03 MED ORDER — MOUNJARO 10 MG/0.5ML ~~LOC~~ SOAJ
10.0000 mg | SUBCUTANEOUS | 3 refills | Status: DC
Start: 2022-11-03 — End: 2022-12-12

## 2022-11-03 NOTE — Telephone Encounter (Signed)
Pt called and is requesting to have her mounjaro dose increased, she is currently at 7.5.   [id number] - [place], [place] - [address]  [address] [place] [address]  Phone: [phone] Fax: [phone]

## 2022-11-03 NOTE — Telephone Encounter (Signed)
Informed patient mounjaro rx sent to pharmacy.

## 2022-11-03 NOTE — Telephone Encounter (Signed)
I will send new rx Mounjaro 10mg  dose to pharmacy.  Saralyn Pilar, DO [facility] 11/03/2022, 12:30 PM

## 2022-11-23 ENCOUNTER — Ambulatory Visit
Admission: RE | Admit: 2022-11-23 | Discharge: 2022-11-23 | Disposition: A | Discharge status: Home / Self Care | Payer: Medicare Other | Source: Ambulatory Visit | Attending: Family Medicine | Admitting: Family Medicine

## 2022-11-23 DIAGNOSIS — Z780 Asymptomatic menopausal state: Secondary | ICD-10-CM | POA: Insufficient documentation

## 2022-12-01 ENCOUNTER — Other Ambulatory Visit: Payer: Self-pay

## 2022-12-01 ENCOUNTER — Other Ambulatory Visit: Payer: Medicare Other

## 2022-12-01 DIAGNOSIS — E1169 Type 2 diabetes mellitus with other specified complication: Secondary | ICD-10-CM

## 2022-12-01 DIAGNOSIS — E538 Deficiency of other specified B group vitamins: Secondary | ICD-10-CM

## 2022-12-01 DIAGNOSIS — Z0000 Encounter for general adult medical examination without abnormal findings: Secondary | ICD-10-CM

## 2022-12-01 DIAGNOSIS — E785 Hyperlipidemia, unspecified: Secondary | ICD-10-CM

## 2022-12-01 DIAGNOSIS — N183 Chronic kidney disease, stage 3 unspecified: Secondary | ICD-10-CM

## 2022-12-01 DIAGNOSIS — E039 Hypothyroidism, unspecified: Secondary | ICD-10-CM

## 2022-12-01 DIAGNOSIS — R7989 Other specified abnormal findings of blood chemistry: Secondary | ICD-10-CM

## 2022-12-01 IMAGING — MG MM DIGITAL SCREENING BILAT W/ TOMO AND CAD
6 of 10 series · 6 of 30 positions shown · non-contrast
Comparison: Previous exam(s).

CLINICAL DATA: Screening.

EXAM:
DIGITAL SCREENING BILATERAL MAMMOGRAM WITH TOMOSYNTHESIS AND CAD
TECHNIQUE: Bilateral screening digital craniocaudal and mediolateral oblique
mammograms were obtained. Bilateral screening digital breast
tomosynthesis was performed. The images were evaluated with
computer-aided detection.

[R MLO synth-2D (1 of 2)]
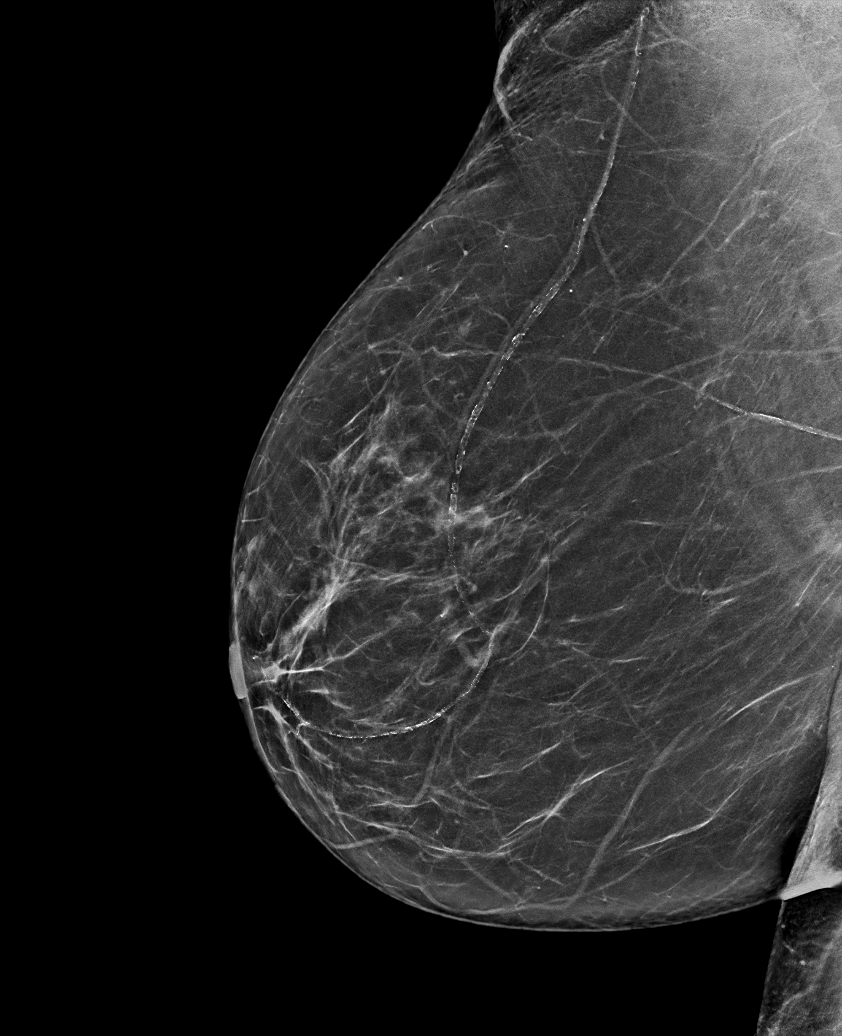

[R MLO synth-2D (2 of 2)]
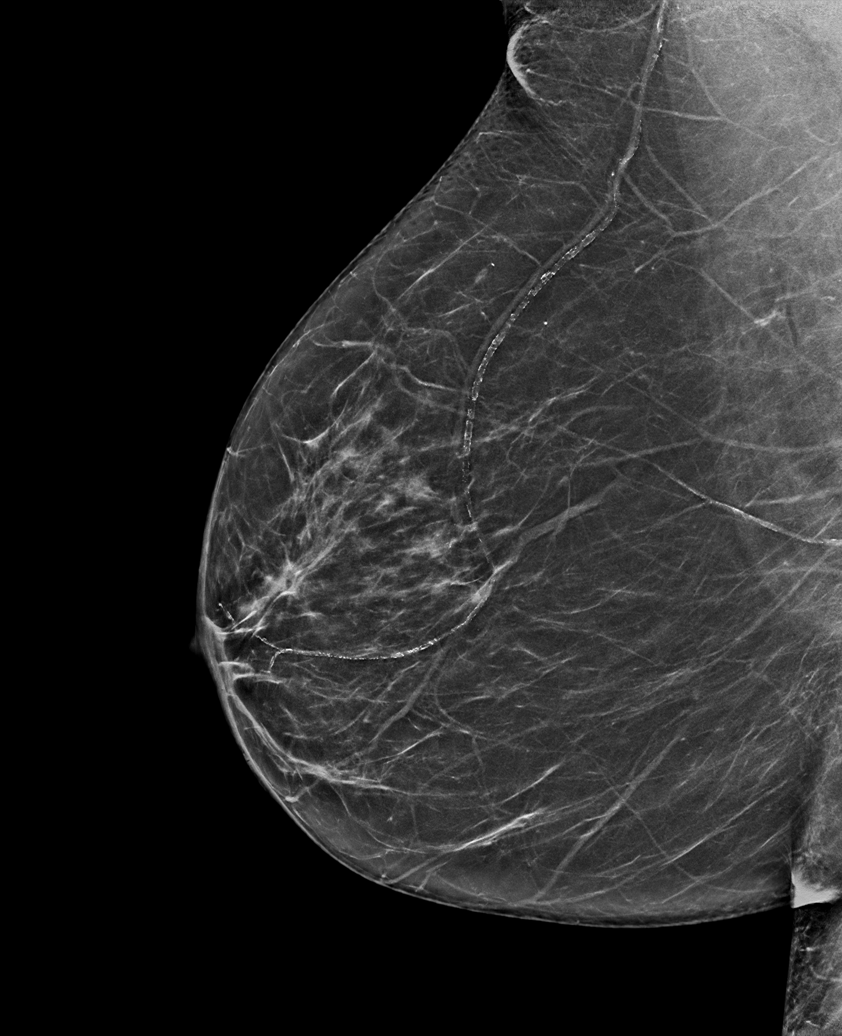

[L MLO synth-2D]
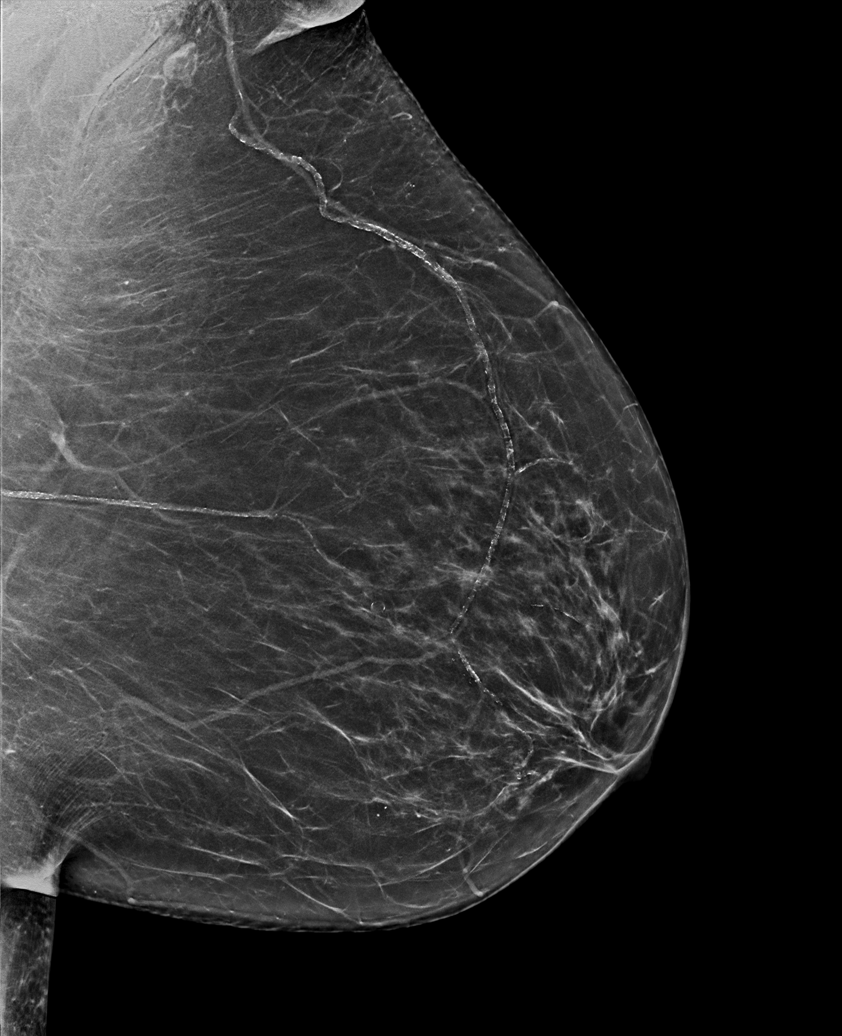

[R CC synth-2D]
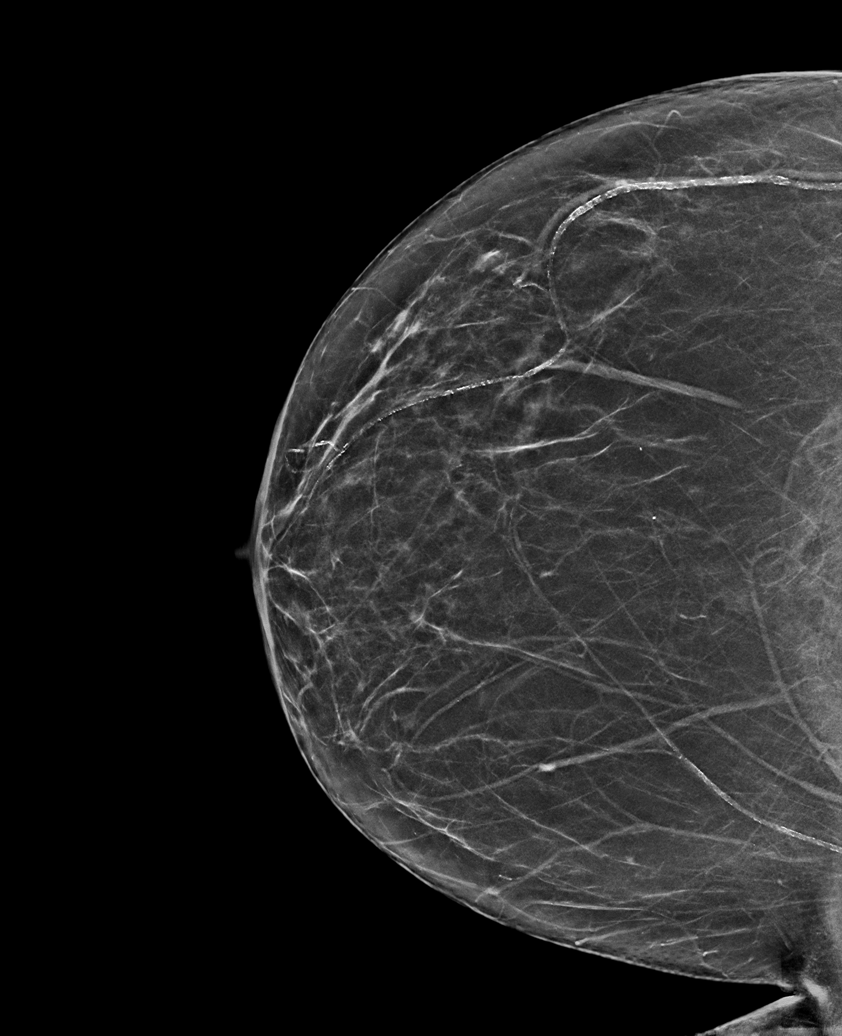

[L CC synth-2D]
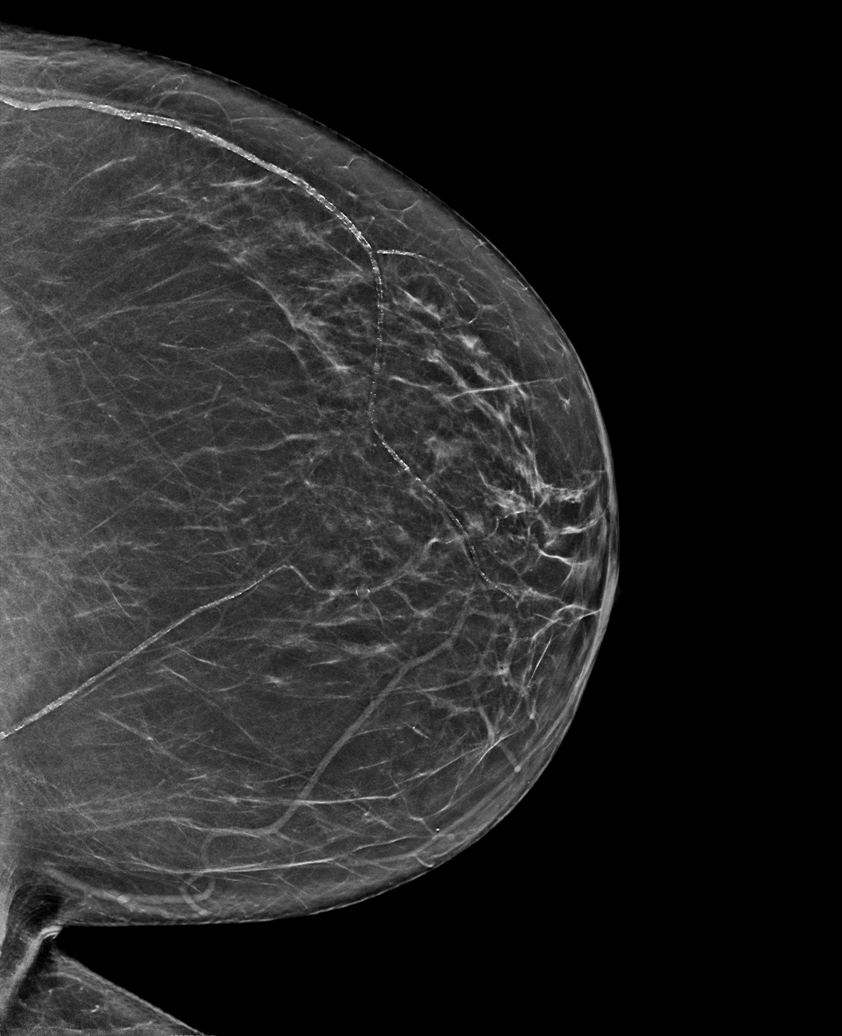

[L CC tomo · tomo slice 31/61.0]
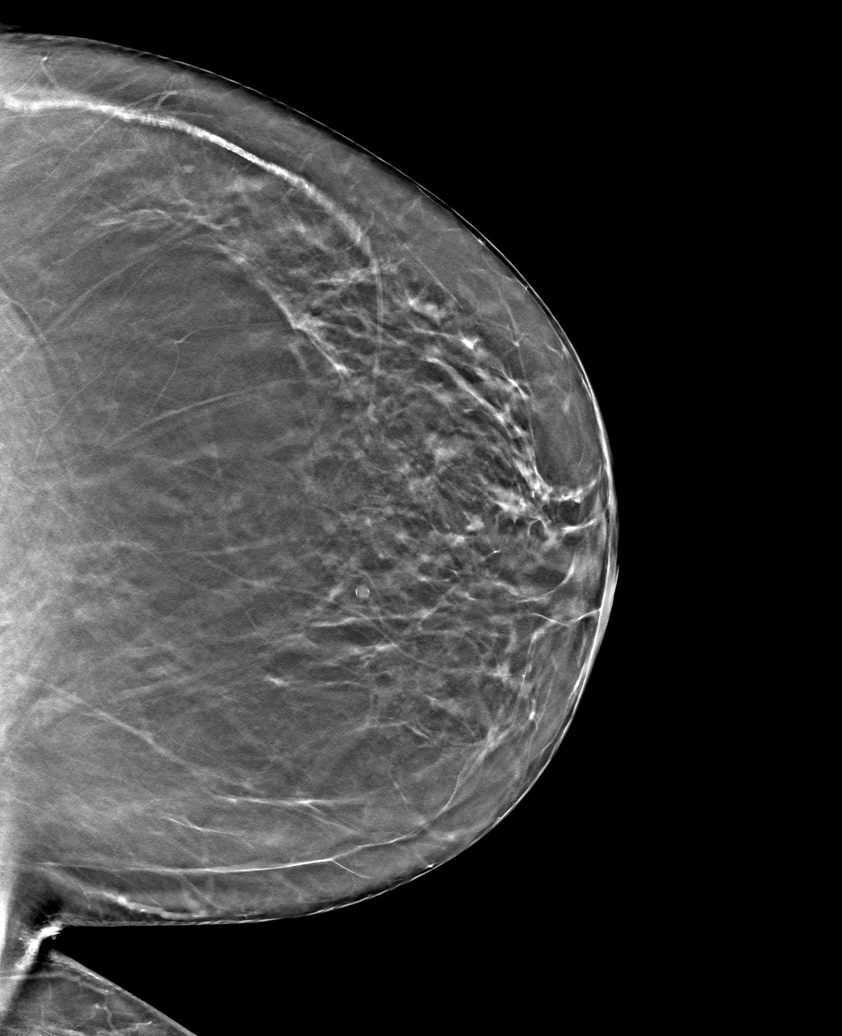

[6 of 30 positions shown; findings below may reference images not displayed]

ACR Breast Density Category b: There are scattered areas of
fibroglandular density.
FINDINGS: There are no findings suspicious for malignancy.
IMPRESSION: No mammographic evidence of malignancy. A result letter of this
screening mammogram will be mailed directly to the patient.

RECOMMENDATION:
Screening mammogram in one year. (Code:51-O-LD2)

BI-RADS CATEGORY  1: Negative.

## 2022-12-02 LAB — COMPREHENSIVE METABOLIC PANEL
AG Ratio: 1.6 (calc) (ref 1.0–2.5)
ALT: 10 U/L (ref 6–29)
AST: 12 U/L (ref 10–35)
Albumin: 3.9 g/dL (ref 3.6–5.1)
Alkaline phosphatase (APISO): 104 U/L (ref 37–153)
BUN: 9 mg/dL (ref 7–25)
CO2: 28 mmol/L (ref 20–32)
Calcium: 10.3 mg/dL (ref 8.6–10.4)
Chloride: 103 mmol/L (ref 98–110)
Creat: 0.94 mg/dL (ref 0.60–1.00)
Globulin: 2.4 g/dL (ref 1.9–3.7)
Glucose, Bld: 85 mg/dL (ref 65–99)
Potassium: 4.4 mmol/L (ref 3.5–5.3)
Sodium: 139 mmol/L (ref 135–146)
Total Bilirubin: 0.7 mg/dL (ref 0.2–1.2)
Total Protein: 6.3 g/dL (ref 6.1–8.1)

## 2022-12-02 LAB — LIPID PANEL
Cholesterol: 150 mg/dL (ref <200)
HDL: 48 mg/dL — ABNORMAL LOW (ref >=50)
LDL Cholesterol (Calc): 73 mg/dL
Non-HDL Cholesterol (Calc): 102 mg/dL (ref <130)
Total CHOL/HDL Ratio: 3.1 (calc) (ref <5.0)
Triglycerides: 203 mg/dL — ABNORMAL HIGH (ref <150)

## 2022-12-02 LAB — CBC WITH DIFFERENTIAL/PLATELET
Absolute Monocytes: 686 {cells}/uL (ref 200–950)
Basophils Absolute: 94 {cells}/uL (ref 0–200)
Basophils Relative: 1.2 %
Eosinophils Absolute: 156 {cells}/uL (ref 15–500)
Eosinophils Relative: 2 %
HCT: 46.2 % — ABNORMAL HIGH (ref 35.0–45.0)
Hemoglobin: 15 g/dL (ref 11.7–15.5)
Lymphs Abs: 1958 {cells}/uL (ref 850–3900)
MCH: 28.3 pg (ref 27.0–33.0)
MCHC: 32.5 g/dL (ref 32.0–36.0)
MCV: 87.2 fL (ref 80.0–100.0)
MPV: 12 fL (ref 7.5–12.5)
Monocytes Relative: 8.8 %
Neutro Abs: 4906 {cells}/uL (ref 1500–7800)
Neutrophils Relative %: 62.9 %
Platelets: 218 10*3/uL (ref 140–400)
RBC: 5.3 10*6/uL — ABNORMAL HIGH (ref 3.80–5.10)
RDW: 12.9 % (ref 11.0–15.0)
Total Lymphocyte: 25.1 %
WBC: 7.8 10*3/uL (ref 3.8–10.8)

## 2022-12-02 LAB — TSH: TSH: 2.53 m[IU]/L (ref 0.40–4.50)

## 2022-12-02 LAB — HEMOGLOBIN A1C
Hgb A1c MFr Bld: 5.9 %{Hb} — ABNORMAL HIGH (ref <5.7)
Mean Plasma Glucose: 123 mg/dL
eAG (mmol/L): 6.8 mmol/L

## 2022-12-02 LAB — VITAMIN B12: Vitamin B-12: 1196 pg/mL — ABNORMAL HIGH (ref 200–1100)

## 2022-12-08 ENCOUNTER — Ambulatory Visit: Payer: Medicare Other | Admitting: Family Medicine

## 2022-12-12 ENCOUNTER — Ambulatory Visit (INDEPENDENT_AMBULATORY_CARE_PROVIDER_SITE_OTHER): Payer: Medicare Other | Admitting: Family Medicine

## 2022-12-12 ENCOUNTER — Encounter: Payer: Self-pay | Admitting: Family Medicine

## 2022-12-12 VITALS — BP 138/82 | Ht 64.0 in | Wt 187.0 lb

## 2022-12-12 DIAGNOSIS — G4733 Obstructive sleep apnea (adult) (pediatric): Secondary | ICD-10-CM | POA: Diagnosis not present

## 2022-12-12 DIAGNOSIS — R7989 Other specified abnormal findings of blood chemistry: Secondary | ICD-10-CM

## 2022-12-12 DIAGNOSIS — E1169 Type 2 diabetes mellitus with other specified complication: Secondary | ICD-10-CM

## 2022-12-12 DIAGNOSIS — I129 Hypertensive chronic kidney disease with stage 1 through stage 4 chronic kidney disease, or unspecified chronic kidney disease: Secondary | ICD-10-CM

## 2022-12-12 DIAGNOSIS — N183 Chronic kidney disease, stage 3 unspecified: Secondary | ICD-10-CM

## 2022-12-12 LAB — POCT GLYCOSYLATED HEMOGLOBIN (HGB A1C)

## 2022-12-12 MED ORDER — MOUNJARO 12.5 MG/0.5ML ~~LOC~~ SOAJ
12.5000 mg | SUBCUTANEOUS | 5 refills | Status: DC
Start: 2022-12-12 — End: 2023-04-27

## 2022-12-12 NOTE — Progress Notes (Signed)
Subjective:    Patient ID: Kelsey Mendoza, female    DOB: [birth year], 72 y.o.   MRN: [id number]  Kelsey Mendoza is a 72 y.o. female presenting on 12/12/2022 for Medical Management of Chronic Issues   HPI  CHRONIC HYPERTENSION CKD Stage III Family History of Heart Attacks Family History Aortic Dissection Reports fam history father with MI and Aortic Dissection lethal at age 24, also older brother with CAD Multiple stents placed and CABG ECHO and functional study 2017    Current Meds - Amlodipine 5mg  daily, also Furosemide 40mg  daily AS NEEDED OFF Losartan 100mg  Reports good compliance, took meds today. Tolerating well, w/o complaints. Denies CP, dyspnea, HA, edema, dizziness / lightheadedness   Type 2 Diabetes Improvement overall with A1c down to 5.9 recently, error not due for A1c today Improved on higher dose Mounjaro alternating 7.5 and 10mg  until finished 7.5mg , and now she will be interested to dose up to 12.5 Meds: Mounjaro 10mg  weekly Currently on ARB Lifestyle: Goal to improve diet - Exercise (limited) Opthalmology specialists. She has seen retina Denies hypoglycemia, polyuria, visual changes, numbness or tingling.   OSA on CPAP Using CPAP nightly with good results, benefit from therapy   Elevated TSH She did trial on Low Dose Levothyroxine 25 mcg daily, but questionable side effect headache, and stopped medicine. Uncertain if it is needed at this time. Prior labs showed mild elevated TSH but still normal range T4 Grandson diagnosed with Hashimoto's and thyroid cancer recently She describes symptoms various with insomnia and dry skin issues.   Vitamin B12 She is on daily B12 Last lab shows B12 1100+  Insomnia Chronic problem Additionally describes significant dry skin face and scalp, dermatitis She has followed prior Dermatology that managed her with MTX and it improved She discontinued Ambien CR 6.25mg , due to side effect, headache. Now she has  decided to stay up 1 hr later, and feels more tired and able to sleep without medicine.   Dry Skin / Scalp Psoriasis   Heartburn GERD Worse in PM Has bed elevated Not on medication  Orthopedics will pursue gel injection for knee pain. Has already tried cortisone injections.      12/12/2022    9:06 AM 09/15/2022    3:38 PM 08/25/2022    8:25 AM  Depression screen PHQ 2/9  Decreased Interest 0 0 2  Down, Depressed, Hopeless 0 0 0  PHQ - 2 Score 0 0 2  Altered sleeping  0 3  Tired, decreased energy  0 3  Change in appetite  0 0  Feeling bad or failure about yourself   0 1  Trouble concentrating  0 0  Moving slowly or fidgety/restless  0 0  PHQ-9 Score  0 9    Social History   Tobacco Use   Smoking status: Former    Current packs/day: 0.00    Types: Cigarettes    Quit date: 03/01/1983    Years since quitting: 39.8   Smokeless tobacco: Never  Vaping Use   Vaping status: Never Used  Substance Use Topics   Alcohol use: No   Drug use: No    Review of Systems Per HPI unless specifically indicated above     Objective:    BP 138/82 (BP Location: Left Arm, Cuff Size: Normal)   Ht 5\' 4"  (1.626 m)   Wt 187 lb (84.8 kg)   BMI 32.10 kg/m   Wt Readings from Last 3 Encounters:  12/12/22 187 lb (84.8 kg)  09/15/22 200 lb (90.7 kg)  08/25/22 200 lb (90.7 kg)    Physical Exam Vitals and nursing note reviewed.  Constitutional:      General: She is not in acute distress.    Appearance: She is well-developed. She is not diaphoretic.     Comments: Well-appearing, comfortable, cooperative  HENT:     Head: Normocephalic and atraumatic.  Eyes:     General:        Right eye: No discharge.        Left eye: No discharge.     Conjunctiva/sclera: Conjunctivae normal.  Neck:     Thyroid: No thyromegaly.  Cardiovascular:     Rate and Rhythm: Normal rate and regular rhythm.     Heart sounds: Normal heart sounds. No murmur heard. Pulmonary:     Effort: Pulmonary effort is  normal. No respiratory distress.     Breath sounds: Normal breath sounds. No wheezing or rales.  Musculoskeletal:        General: Normal range of motion.     Cervical back: Normal range of motion and neck supple.  Lymphadenopathy:     Cervical: No cervical adenopathy.  Skin:    General: Skin is warm and dry.     Findings: No erythema or rash.  Neurological:     Mental Status: She is alert and oriented to person, place, and time.  Psychiatric:        Behavior: Behavior normal.     Comments: Well groomed, good eye contact, normal speech and thoughts     Diabetic Foot Exam - Simple   Simple Foot Form Diabetic Foot exam was performed with the following findings: Yes 12/12/2022  9:30 AM  Visual Inspection No deformities, no ulcerations, no other skin breakdown bilaterally: Yes Sensation Testing Intact to touch and monofilament testing bilaterally: Yes Pulse Check Posterior Tibialis and Dorsalis pulse intact bilaterally: Yes Comments    Recent Labs    04/17/22 0810 08/18/22 0811 12/01/22 0807  HGBA1C 7.3* 5.8* 5.9*         Assessment & Plan:   Problem List Items Addressed This Visit     Benign hypertension with CKD (chronic kidney disease) stage III (HCC)    Well-controlled HYPERTENSION previously now some elevated readings. Complication with CKD-III  Plan:  Keep track of BP occasionally, 1-2 x month check readings.  Continue Amlodipine 5mg   Remain OFF Losartan ARB currently Follow up on BP readings and if need to restart Losartan (can be lower dose than 100mg ) If still elevated >140, we can resume a lower dose or other version of Losartan.   Encourage improved lifestyle - low sodium diet, regular exercise Continue monitor BP outside office, bring readings to next visit, if persistently >140/90 or new symptoms notify office sooner       Elevated TSH   OSA on CPAP    Well controlled, chronic OSA on CPAP - Good adherence to CPAP nightly - Continue current  CPAP therapy, patient seems to be benefiting from therapy       Type 2 diabetes mellitus with other specified complication (HCC) - Primary    Stable improved A1c 5.9 on recent lab on Mounjaro therapy Controlled Complications - CKDIII, Hyperlipidemia, OSA OFF Metformin  Plan:  Dose increase Mounjaro from 10 to 12.5mg  weekly inj Remain off  Metformin  Encourage improved lifestyle - low carb, low sugar diet, reduce portion size, continue improving regular exercise 4. Check CBG, bring log to next visit for review 5. Continue ARB,  Statin DM Foot      Relevant Medications   MOUNJARO 12.5 MG/0.5ML Pen   Other Relevant Orders   POCT HgB A1C (Completed)    POC A1c fingerstick ordered but discarded due to duplicate. Already had lab drawn prior to visit.  Bone Density is negative. No sign of osteoporosis  Remain off Ambien if not needed.  Improved TSH normalized Next time we can check thyroid panel including hashimoto thyroid antibody. Remain off Levothyroxine for now, we can re-discuss based on next lab.   Meds ordered this encounter  Medications   MOUNJARO 12.5 MG/0.5ML Pen    Sig: Inject 12.5 mg into the skin once a week.    Dispense:  2 mL    Refill:  5      Follow up plan: Return in about 6 months (around 06/11/2023) for 6 month fasting lab only then 1 week later Follow-up Lab results, DM, HTN, Thyroid.  Future labs ordered for 06/04/23  6 months A1c + Thyroid Panel including TPO eval for hashimoto's thyroiditis    Saralyn Pilar, DO [facility] 12/12/2022, 9:16 AM

## 2022-12-12 NOTE — Patient Instructions (Addendum)
Thank you for coming to the office today.  Dose increase Mounjaro from 10 to 12.5mg   Keep track of BP occasionally, 1-2 x month check readings.  Continue Amlodipine 5mg   If still elevated >140, we can resume a lower dose or other version of Losartan.  Recent Labs    08/18/22 0811 12/01/22 0807 12/12/22 0912  HGBA1C 5.8* 5.9* 5.6   Bone Density is negative. No sign of osteoporosis  Remain off Ambien if not needed.  Next time we can check thyroid panel including hashimoto thyroid antibody.  Remain off Levothyroxine for now, we can re-discuss based on next lab.  DUE for FASTING BLOOD WORK (no food or drink after midnight before the lab appointment, only water or coffee without cream/sugar on the morning of)  SCHEDULE "Lab Only" visit in the morning at the clinic for lab draw in 6 MONTHS   - Make sure Lab Only appointment is at about 1 week before your next appointment, so that results will be available  For Lab Results, once available within 2-3 days of blood draw, you can can log in to MyChart online to view your results and a brief explanation. Also, we can discuss results at next follow-up visit.   Please schedule a Follow-up Appointment to: Return in about 6 months (around 06/11/2023) for 6 month fasting lab only then 1 week later Follow-up Lab results, DM, HTN, Thyroid.  If you have any other questions or concerns, please feel free to call the office or send a message through MyChart. You may also schedule an earlier appointment if necessary.  Additionally, you may be receiving a survey about your experience at our office within a few days to 1 week by e-mail or mail. We value your feedback.  Saralyn Pilar, DO [facility], [place]

## 2022-12-13 ENCOUNTER — Encounter: Payer: Self-pay | Admitting: Family Medicine

## 2022-12-13 ENCOUNTER — Other Ambulatory Visit: Payer: Self-pay | Admitting: Family Medicine

## 2022-12-13 DIAGNOSIS — R7989 Other specified abnormal findings of blood chemistry: Secondary | ICD-10-CM

## 2022-12-13 DIAGNOSIS — E1169 Type 2 diabetes mellitus with other specified complication: Secondary | ICD-10-CM

## 2022-12-13 DIAGNOSIS — E039 Hypothyroidism, unspecified: Secondary | ICD-10-CM

## 2022-12-13 NOTE — Assessment & Plan Note (Signed)
Well controlled, chronic OSA on CPAP - Good adherence to CPAP nightly - Continue current CPAP therapy, patient seems to be benefiting from therapy  

## 2022-12-13 NOTE — Assessment & Plan Note (Signed)
Well-controlled HYPERTENSION previously now some elevated readings. Complication with CKD-III  Plan:  Keep track of BP occasionally, 1-2 x month check readings.  Continue Amlodipine 5mg   Remain OFF Losartan ARB currently Follow up on BP readings and if need to restart Losartan (can be lower dose than 100mg ) If still elevated >140, we can resume a lower dose or other version of Losartan.   Encourage improved lifestyle - low sodium diet, regular exercise Continue monitor BP outside office, bring readings to next visit, if persistently >140/90 or new symptoms notify office sooner

## 2022-12-13 NOTE — Assessment & Plan Note (Addendum)
Stable improved A1c 5.9 on recent lab on Mounjaro therapy Controlled Complications - CKDIII, Hyperlipidemia, OSA OFF Metformin  Plan:  Dose increase Mounjaro from 10 to 12.5mg  weekly inj Remain off  Metformin  Encourage improved lifestyle - low carb, low sugar diet, reduce portion size, continue improving regular exercise 4. Check CBG, bring log to next visit for review 5. Continue ARB, Statin DM Foot

## 2023-02-07 ENCOUNTER — Other Ambulatory Visit: Payer: Self-pay | Admitting: Family Medicine

## 2023-02-07 DIAGNOSIS — Z1231 Encounter for screening mammogram for malignant neoplasm of breast: Secondary | ICD-10-CM

## 2023-02-12 ENCOUNTER — Ambulatory Visit
Admission: RE | Admit: 2023-02-12 | Discharge: 2023-02-12 | Disposition: A | Discharge status: Home / Self Care | Payer: Medicare Other | Source: Ambulatory Visit | Attending: Family Medicine | Admitting: Family Medicine

## 2023-02-12 DIAGNOSIS — Z1231 Encounter for screening mammogram for malignant neoplasm of breast: Secondary | ICD-10-CM | POA: Diagnosis present

## 2023-02-16 ENCOUNTER — Other Ambulatory Visit: Payer: Self-pay | Admitting: Family Medicine

## 2023-02-16 DIAGNOSIS — K219 Gastro-esophageal reflux disease without esophagitis: Secondary | ICD-10-CM

## 2023-02-16 NOTE — Telephone Encounter (Signed)
Requested medication (s) are due for refill today: routing for review  Requested medication (s) are on the active medication list: no  Last refill:  unknown  Future visit scheduled: yes  Notes to clinic:  Unable to refill per protocol, Rx expired. Not on current  list, routing for approval      Requested Prescriptions  Pending Prescriptions Disp Refills   triamterene-hydrochlorothiazide (MAXZIDE-25) 37.5-25 MG tablet [Pharmacy Med Name: TRIAMTERENE-HCTZ 37.5-25 MG TB] 90 tablet 2    Sig: TAKE 1 TABLET BY MOUTH EVERY DAY     Cardiovascular: Diuretic Combos Passed - 02/16/2023  1:40 AM      Passed - K in normal range and within 180 days    Potassium  Date Value Ref Range Status  12/01/2022 4.4 3.5 - 5.3 mmol/L Final         Passed - Na in normal range and within 180 days    Sodium  Date Value Ref Range Status  12/01/2022 139 135 - 146 mmol/L Final         Passed - Cr in normal range and within 180 days    Creat  Date Value Ref Range Status  12/01/2022 0.94 0.60 - 1.00 mg/dL Final   Creatinine, Urine  Date Value Ref Range Status  08/25/2022 95 20 - 275 mg/dL Final         Passed - Last BP in normal range    BP Readings from Last 1 Encounters:  12/13/22 138/82         Passed - Valid encounter within last 6 months    Recent Outpatient Visits           2 months ago Type 2 diabetes mellitus with other specified complication, without long-term current use of insulin (HCC)   [place] [place], Netta Neat, DO   5 months ago Type 2 diabetes mellitus with other specified complication, without long-term current use of insulin (HCC)   [place] [facility] [place], Netta Neat, DO   10 months ago Type 2 diabetes mellitus with other specified complication, without long-term current use of insulin (HCC)   [place] [facility] [place], Netta Neat, DO   1 year ago Pre-diabetes   [place] [facility] Althea Charon, Netta Neat, DO       Future Appointments             In 3 months Althea Charon, Netta Neat, DO [place] [facility], [facility]            Refused Prescriptions Disp Refills   omeprazole (PRILOSEC) 40 MG capsule [Pharmacy Med Name: OMEPRAZOLE DR 40 MG CAPSULE] 90 capsule 1    Sig: TAKE 1 CAPSULE BY MOUTH EVERY DAY BEFORE BREAKFAST     Gastroenterology: Proton Pump Inhibitors Passed - 02/16/2023  1:40 AM      Passed - Valid encounter within last 12 months    Recent Outpatient Visits           2 months ago Type 2 diabetes mellitus with other specified complication, without long-term current use of insulin [facility])   [place] [facility] [place], Netta Neat, DO   5 months ago Type 2 diabetes mellitus with other specified complication, without long-term current use of insulin [facility])   [place] [facility] Smitty Cords, DO   10 months ago Type 2 diabetes mellitus with other specified complication, without long-term  current use of insulin [facility])   [place] [facility] [place], Netta Neat, DO   1 year ago Pre-diabetes   [facility] Althea Charon, Netta Neat, DO       Future Appointments             In 3 months Althea Charon, Netta Neat, DO [place] [facility], [place]

## 2023-02-16 NOTE — Telephone Encounter (Signed)
Unable to refill per protocol, Rx expired. Discontinued 12/12/22.  Requested Prescriptions  Pending Prescriptions Disp Refills   omeprazole (PRILOSEC) 40 MG capsule [Pharmacy Med Name: OMEPRAZOLE DR 40 MG CAPSULE] 90 capsule 1    Sig: TAKE 1 CAPSULE BY MOUTH EVERY DAY BEFORE BREAKFAST     Gastroenterology: Proton Pump Inhibitors Passed - 02/16/2023  1:40 AM      Passed - Valid encounter within last 12 months    Recent Outpatient Visits           2 months ago Type 2 diabetes mellitus with other specified complication, without long-term current use of insulin (HCC)   [facility] [facility] [place], Netta Neat, DO   5 months ago Type 2 diabetes mellitus with other specified complication, without long-term current use of insulin [facility])   [place] [facility] [place], Netta Neat, DO   10 months ago Type 2 diabetes mellitus with other specified complication, without long-term current use of insulin (HCC)   [place] [facility] [place], Netta Neat, DO   1 year ago Pre-diabetes   [place] [facility] [place], Netta Neat, DO       Future Appointments             In 3 months Althea Charon, Netta Neat, DO [place] [facility], [facility]             triamterene-hydrochlorothiazide (MAXZIDE-25) 37.5-25 MG tablet [Pharmacy Med Name: TRIAMTERENE-HCTZ 37.5-25 MG TB] 90 tablet 2    Sig: TAKE 1 TABLET BY MOUTH EVERY DAY     Cardiovascular: Diuretic Combos Passed - 02/16/2023  1:40 AM      Passed - K in normal range and within 180 days    Potassium  Date Value Ref Range Status  12/01/2022 4.4 3.5 - 5.3 mmol/L Final         Passed - Na in normal range and within 180 days    Sodium  Date Value Ref Range Status  12/01/2022 139 135 - 146 mmol/L Final         Passed - Cr in normal range and within 180 days    Creat  Date Value Ref Range Status  12/01/2022 0.94 0.60 - 1.00 mg/dL Final    Creatinine, Urine  Date Value Ref Range Status  08/25/2022 95 20 - 275 mg/dL Final         Passed - Last BP in normal range    BP Readings from Last 1 Encounters:  12/13/22 138/82         Passed - Valid encounter within last 6 months    Recent Outpatient Visits           2 months ago Type 2 diabetes mellitus with other specified complication, without long-term current use of insulin [facility])   [place] [facility] [place], Netta Neat, DO   5 months ago Type 2 diabetes mellitus with other specified complication, without long-term current use of insulin [facility])   [place] [facility] [place], Netta Neat, DO   10 months ago Type 2 diabetes mellitus with other specified complication, without long-term current use of insulin [facility])   [place] [facility] [place], Netta Neat, DO   1 year ago Pre-diabetes   [place] [facility] Smitty Cords, DO       Future Appointments  In 3 months [place], Netta Neat, DO [place] [facility], [facility] [place]

## 2023-02-18 ENCOUNTER — Other Ambulatory Visit: Payer: Self-pay | Admitting: Family Medicine

## 2023-02-18 DIAGNOSIS — E1169 Type 2 diabetes mellitus with other specified complication: Secondary | ICD-10-CM

## 2023-02-19 NOTE — Telephone Encounter (Signed)
Requested Prescriptions  Refused Prescriptions Disp Refills   MOUNJARO 10 MG/0.5ML Pen [Pharmacy Med Name: MOUNJARO 10 MG/0.5 ML PEN]  3    Sig: INJECT 10 MG INTO THE SKIN ONE TIME PER WEEK     Off-Protocol Failed - 02/18/2023  8:48 AM      Failed - Medication not assigned to a protocol, review manually.      Passed - Valid encounter within last 12 months    Recent Outpatient Visits           2 months ago Type 2 diabetes mellitus with other specified complication, without long-term current use of insulin [facility])   [place] [facility] [place], Netta Neat, DO   5 months ago Type 2 diabetes mellitus with other specified complication, without long-term current use of insulin [facility])   [place] [facility] [place], Netta Neat, DO   10 months ago Type 2 diabetes mellitus with other specified complication, without long-term current use of insulin [facility])   [facility] Smitty Cords, DO   1 year ago Pre-diabetes   [place] [facility] Smitty Cords, DO       Future Appointments             In 3 months Althea Charon, Netta Neat, DO [place] [facility]

## 2023-04-27 ENCOUNTER — Telehealth: Payer: Self-pay | Admitting: Family Medicine

## 2023-04-27 DIAGNOSIS — E1169 Type 2 diabetes mellitus with other specified complication: Secondary | ICD-10-CM

## 2023-04-27 MED ORDER — MOUNJARO 15 MG/0.5ML ~~LOC~~ SOAJ: 15.0000 mg | SUBCUTANEOUS | 5 refills | Status: DC

## 2023-04-27 NOTE — Telephone Encounter (Signed)
Medication Refill -  Most Recent Primary Care Visit:  Provider: Smitty Cords  Department: [facility]  Visit Type: OFFICE VISIT  Date: 12/12/2022  Medication: MOUNJARO 12.5 MG/0.5ML Pen   (Patient is requesting to be upped in dosage to 15 mg)  Has the patient contacted their pharmacy? No   Is this the correct pharmacy for this prescription? Yes  This is the patient's preferred pharmacy:  [id number] - [place], [place] - [address] [place] [address] Phone: [phone] Fax: [phone]   Has the prescription been filled recently? Yes  Is the patient out of the medication? Yes  Has the patient been seen for an appointment in the last year OR does the patient have an upcoming appointment? Yes  Can we respond through MyChart? Yes  Agent: Please be advised that Rx refills may take up to 3 business days. We ask that you follow-up with your pharmacy.

## 2023-05-21 DIAGNOSIS — G4733 Obstructive sleep apnea (adult) (pediatric): Secondary | ICD-10-CM | POA: Diagnosis not present

## 2023-05-25 DIAGNOSIS — M25562 Pain in left knee: Secondary | ICD-10-CM | POA: Diagnosis not present

## 2023-05-25 DIAGNOSIS — M1712 Unilateral primary osteoarthritis, left knee: Secondary | ICD-10-CM | POA: Diagnosis not present

## 2023-05-29 ENCOUNTER — Other Ambulatory Visit: Payer: Self-pay | Admitting: Family Medicine

## 2023-05-29 DIAGNOSIS — E039 Hypothyroidism, unspecified: Secondary | ICD-10-CM

## 2023-05-29 DIAGNOSIS — Z01818 Encounter for other preprocedural examination: Secondary | ICD-10-CM

## 2023-05-29 DIAGNOSIS — E1169 Type 2 diabetes mellitus with other specified complication: Secondary | ICD-10-CM

## 2023-06-04 ENCOUNTER — Other Ambulatory Visit: Payer: PRIVATE HEALTH INSURANCE

## 2023-06-04 DIAGNOSIS — Z01818 Encounter for other preprocedural examination: Secondary | ICD-10-CM

## 2023-06-04 DIAGNOSIS — E1169 Type 2 diabetes mellitus with other specified complication: Secondary | ICD-10-CM | POA: Diagnosis not present

## 2023-06-04 DIAGNOSIS — E039 Hypothyroidism, unspecified: Secondary | ICD-10-CM | POA: Diagnosis not present

## 2023-06-06 LAB — BASIC METABOLIC PANEL WITH GFR
BUN: 10 mg/dL (ref 7–25)
CO2: 29 mmol/L (ref 20–32)
Calcium: 9.6 mg/dL (ref 8.6–10.4)
Chloride: 103 mmol/L (ref 98–110)
Creat: 0.97 mg/dL (ref 0.60–1.00)
Glucose, Bld: 73 mg/dL (ref 65–99)
Potassium: 3.9 mmol/L (ref 3.5–5.3)
Sodium: 141 mmol/L (ref 135–146)
eGFR: 62 mL/min/{1.73_m2} (ref >=60)

## 2023-06-06 LAB — CBC WITH DIFFERENTIAL/PLATELET
Absolute Lymphocytes: 1890 {cells}/uL (ref 850–3900)
Absolute Monocytes: 748 {cells}/uL (ref 200–950)
Basophils Absolute: 59 {cells}/uL (ref 0–200)
Basophils Relative: 0.7 %
Eosinophils Absolute: 151 {cells}/uL (ref 15–500)
Eosinophils Relative: 1.8 %
HCT: 45.2 % — ABNORMAL HIGH (ref 35.0–45.0)
Hemoglobin: 14.9 g/dL (ref 11.7–15.5)
MCH: 28.2 pg (ref 27.0–33.0)
MCHC: 33 g/dL (ref 32.0–36.0)
MCV: 85.6 fL (ref 80.0–100.0)
MPV: 12.9 fL — ABNORMAL HIGH (ref 7.5–12.5)
Monocytes Relative: 8.9 %
Neutro Abs: 5552 {cells}/uL (ref 1500–7800)
Neutrophils Relative %: 66.1 %
Platelets: 205 10*3/uL (ref 140–400)
RBC: 5.28 10*6/uL — ABNORMAL HIGH (ref 3.80–5.10)
RDW: 12.7 % (ref 11.0–15.0)
Total Lymphocyte: 22.5 %
WBC: 8.4 10*3/uL (ref 3.8–10.8)

## 2023-06-06 LAB — HEMOGLOBIN A1C
Hgb A1c MFr Bld: 5.7 %{Hb} — ABNORMAL HIGH (ref <5.7)
Mean Plasma Glucose: 117 mg/dL
eAG (mmol/L): 6.5 mmol/L

## 2023-06-06 LAB — T4, FREE: Free T4: 1.1 ng/dL (ref 0.8–1.8)

## 2023-06-06 LAB — THYROGLOBULIN LEVEL: Thyroglobulin: 15.9 ng/mL

## 2023-06-06 LAB — TSH: TSH: 2.21 m[IU]/L (ref 0.40–4.50)

## 2023-06-06 LAB — THYROID PEROXIDASE ANTIBODY: Thyroperoxidase Ab SerPl-aCnc: <1 [IU]/mL (ref <9)

## 2023-06-11 ENCOUNTER — Encounter: Payer: Self-pay | Admitting: Family Medicine

## 2023-06-11 ENCOUNTER — Ambulatory Visit: Payer: 59 | Admitting: Family Medicine

## 2023-06-11 VITALS — BP 122/80 | HR 64 | Ht 64.0 in | Wt 166.0 lb

## 2023-06-11 DIAGNOSIS — N183 Chronic kidney disease, stage 3 unspecified: Secondary | ICD-10-CM | POA: Diagnosis not present

## 2023-06-11 DIAGNOSIS — G4733 Obstructive sleep apnea (adult) (pediatric): Secondary | ICD-10-CM

## 2023-06-11 DIAGNOSIS — M1712 Unilateral primary osteoarthritis, left knee: Secondary | ICD-10-CM | POA: Insufficient documentation

## 2023-06-11 DIAGNOSIS — F5101 Primary insomnia: Secondary | ICD-10-CM | POA: Diagnosis not present

## 2023-06-11 DIAGNOSIS — I129 Hypertensive chronic kidney disease with stage 1 through stage 4 chronic kidney disease, or unspecified chronic kidney disease: Secondary | ICD-10-CM

## 2023-06-11 DIAGNOSIS — Z01818 Encounter for other preprocedural examination: Secondary | ICD-10-CM

## 2023-06-11 DIAGNOSIS — Z7985 Long-term (current) use of injectable non-insulin antidiabetic drugs: Secondary | ICD-10-CM

## 2023-06-11 DIAGNOSIS — B351 Tinea unguium: Secondary | ICD-10-CM | POA: Diagnosis not present

## 2023-06-11 DIAGNOSIS — E1169 Type 2 diabetes mellitus with other specified complication: Secondary | ICD-10-CM | POA: Diagnosis not present

## 2023-06-11 NOTE — Patient Instructions (Addendum)
 Thank you for coming to the office today.  Toenail Fungus - use the topical antifungal nail polish up to 1 year  Contact the medical equipment supplier for CPAP first - and find out best way to order, either we can generate an order and fax it if you tell us where, or you can initiate order and they can send it to [place].  Cleared for surgery  Pause or hold Mounjaro 2 weeks prior to surgery.  All lab results are excellent.  No hashimotos thyroid  Please schedule a Follow-up Appointment to: Return in about 6 months (around 12/12/2023) for 6 month DM A1c.  If you have any other questions or concerns, please feel free to call the office or send a message through MyChart. You may also schedule an earlier appointment if necessary.  Additionally, you may be receiving a survey about your experience at our office within a few days to 1 week by e-mail or mail. We value your feedback.  Saralyn Pilar, DO [facility], [place]

## 2023-06-11 NOTE — Progress Notes (Addendum)
 Subjective:    Patient ID: Kelsey Mendoza, female    DOB: 02-Sep-1950, 73 y.o.   MRN: [id number]  Kelsey Mendoza is a 73 y.o. female presenting on 06/11/2023 for Knee Pain, Pre-op Exam (Left Knee TKR), and Diabetes   HPI  Discussed the use of AI scribe software for clinical note transcription with the patient, who gave verbal consent to proceed.  History of Present Illness    Kelsey Mendoza "Kelsey Mendoza" is a 73 year old female with diabetes who presents for preoperative evaluation and lab review.  PRE-OPERATIVE MEDICAL CLEARANCE  Anticipated upcoming surgery - Left Total Knee Replacement TKR, by Dr Audelia Acton (at [facility]) in approximately 3 weeks approx 07/03/23  Regarding surgical and anesthesia history: - Known history of several major surgeries in past, and has tolerated general anesthesia well without problem, complication or allergy. No family history of problem with anesthesia - Able to tolerate regular exercise up to >4 METs, walking up flight of stairs - No known history of cardiovascular disease. Never had MI or known CAD. - No known history of pulmonary disease.  Former smoker quit 1984. No diagnosis of COPD Asthma She has OSA treated with CPAP - Denies exertional symptoms of chest pain or tightness, dyspnea, coughing, apnea, syncopal episodes, palpitations  Last lab result reviwed  Type 2 Diabetes  She is preparing for an upcoming left total knee replacement surgery. Her blood pressure is stable, and she has been managing her diabetes effectively. She has new insurance and has been receiving letters regarding prior authorization for [facility], but she has enough supply for more than a month.  Her diabetes is managed with Mounjaro 15 mg, and her recent A1c is 5.7%, indicating her blood sugar levels are in the prediabetic range. She has experienced a significant weight loss of 54 pounds over the past year, attributed to her current treatment regimen.  Screening  Thyroid Disease She is not taking any thyroid medication, and recent tests for Hashimoto's disease were negative. Her thyroid function tests, including TSH and free T4, are within normal limits.  OSA on CPAP - Patient reports prior history of dx OSA and on CPAP - Today reports that sleep apnea is well controlled. She uses the CPAP machine every night. Tolerates the machine well, and thinks that sleeps better with it. She has benefited from using CPAP machine.  Recently, due to L knee she has difficulty sleeping, which she attributes to pain, and often takes naps during the day. She is considering obtaining a travel CPAP machine to use in her living room to help with her sleep issues.  Left Great toenail Onychomycosis She has a toenail fungus on her left big toe, characterized by discoloration and thickening of the nail, without additional symptoms.       Past Surgical History:  Procedure Laterality Date   ABDOMINAL HYSTERECTOMY     BACK SURGERY     BLADDER SURGERY     BROW LIFT Bilateral 05/27/2021   Procedure: BLEPHAROPLASTY UPPER EYELID; W/EXCESS SKIN AND BROW PTOSIS REPAIR BILATERAL;  Surgeon: Imagene Riches, MD;  Location: [facility];  Service: Ophthalmology;  Laterality: Bilateral;   CARPAL TUNNEL RELEASE Right 06/06/2017   Procedure: CARPAL TUNNEL RELEASE;  Surgeon: Juanell Fairly, MD;  Location: [facility];  Service: Orthopedics;  Laterality: Right;   EYE SURGERY Left 11/26/2019   Dr. Luciana Axe, Vit, Mem Glynda Jaeger   I & D EXTREMITY Right 06/06/2017   Procedure: IRRIGATION AND DEBRIDEMENT EXTREMITY--RIGHT ARM;  Surgeon:  Juanell Fairly, MD;  Location: [facility];  Service: Orthopedics;  Laterality: Right;   KNEE ARTHROSCOPY Left 10/06/2019   Procedure: ARTHROSCOPY KNEE,PARTIAL MEDIAL MENISECTOMY,PARTIAL CHONDROPLASTY;  Surgeon: Donato Heinz, MD;  Location: [facility];  Service: Orthopedics;  Laterality: Left;   KNEE SURGERY     OPEN REDUCTION INTERNAL FIXATION (ORIF) DISTAL RADIAL  FRACTURE N/A 06/06/2017   Procedure: OPEN REDUCTION INTERNAL FIXATION (ORIF) DISTAL RADIAL FRACTURE;  Surgeon: Juanell Fairly, MD;  Location: [facility];  Service: Orthopedics;  Laterality: N/A;         06/11/2023    9:24 AM 12/12/2022    9:06 AM 09/15/2022    3:38 PM  Depression screen PHQ 2/9  Decreased Interest 0 0 0  Down, Depressed, Hopeless 0 0 0  PHQ - 2 Score 0 0 0  Altered sleeping 3  0  Tired, decreased energy 3  0  Change in appetite 0  0  Feeling bad or failure about yourself  0  0  Trouble concentrating 0  0  Moving slowly or fidgety/restless 0  0  Suicidal thoughts 0    PHQ-9 Score 6  0  Difficult doing work/chores Not difficult at all         06/11/2023    9:24 AM 12/12/2022    9:06 AM 08/25/2022    8:25 AM 04/20/2022    9:01 AM  GAD 7 : Generalized Anxiety Score  Nervous, Anxious, on Edge 0 0 0 0  Control/stop worrying 0 0 0 0  Worry too much - different things 0 0 0 0  Trouble relaxing 0 0 0 0  Restless 0 0 0 0  Easily annoyed or irritable 0 0 0 0  Afraid - awful might happen 0 0 0 0  Total GAD 7 Score 0 0 0 0  Anxiety Difficulty    Not difficult at all    Social History   Tobacco Use   Smoking status: Former    Current packs/day: 0.00    Types: Cigarettes    Quit date: 03/01/1983    Years since quitting: 40.3   Smokeless tobacco: Never  Vaping Use   Vaping status: Never Used  Substance Use Topics   Alcohol use: No   Drug use: No    Review of Systems Per HPI unless specifically indicated above     Objective:    BP 122/80   Pulse 64   Ht 5\' 4"  (1.626 m)   Wt 166 lb (75.3 kg)   SpO2 96%   BMI 28.49 kg/m   Wt Readings from Last 3 Encounters:  06/11/23 166 lb (75.3 kg)  12/12/22 187 lb (84.8 kg)  09/15/22 200 lb (90.7 kg)    Physical Exam Vitals and nursing note reviewed.  Constitutional:      General: She is not in acute distress.    Appearance: She is well-developed. She is not diaphoretic.     Comments: Well-appearing, comfortable,  cooperative  HENT:     Head: Normocephalic and atraumatic.  Eyes:     General:        Right eye: No discharge.        Left eye: No discharge.     Conjunctiva/sclera: Conjunctivae normal.  Neck:     Thyroid: No thyromegaly.  Cardiovascular:     Rate and Rhythm: Normal rate and regular rhythm.     Heart sounds: Normal heart sounds. No murmur heard. Pulmonary:     Effort: Pulmonary effort is normal. No respiratory  distress.     Breath sounds: Normal breath sounds. No wheezing or rales.  Musculoskeletal:        General: Normal range of motion.     Cervical back: Normal range of motion and neck supple.     Right lower leg: No edema.     Left lower leg: No edema.  Lymphadenopathy:     Cervical: No cervical adenopathy.  Skin:    General: Skin is warm and dry.     Findings: Lesion (Left great toenail with discoloration and thickening) present. No erythema or rash.  Neurological:     Mental Status: She is alert and oriented to person, place, and time.  Psychiatric:        Behavior: Behavior normal.     Comments: Well groomed, good eye contact, normal speech and thoughts     Results for orders placed or performed in visit on 06/04/23  BASIC METABOLIC PANEL WITH GFR   Collection Time: 06/04/23  8:47 AM  Result Value Ref Range   Glucose, Bld 73 65 - 99 mg/dL   BUN 10 7 - 25 mg/dL   Creat 9.52 8.41 - 3.24 mg/dL   eGFR 62 > OR = 60 MW/NUU/7.25D6   BUN/Creatinine Ratio SEE NOTE: 6 - 22 (calc)   Sodium 141 135 - 146 mmol/L   Potassium 3.9 3.5 - 5.3 mmol/L   Chloride 103 98 - 110 mmol/L   CO2 29 20 - 32 mmol/L   Calcium 9.6 8.6 - 10.4 mg/dL  Thyroid peroxidase antibody   Collection Time: 06/04/23  8:47 AM  Result Value Ref Range   Thyroperoxidase Ab SerPl-aCnc <1 <9 IU/mL  Thyroglobulin Level   Collection Time: 06/04/23  8:47 AM  Result Value Ref Range   Thyroglobulin 15.9 ng/mL   Comment    T4, free   Collection Time: 06/04/23  8:47 AM  Result Value Ref Range   Free T4 1.1  0.8 - 1.8 ng/dL  TSH   Collection Time: 06/04/23  8:47 AM  Result Value Ref Range   TSH 2.21 0.40 - 4.50 mIU/L  Hemoglobin A1c   Collection Time: 06/04/23  8:47 AM  Result Value Ref Range   Hgb A1c MFr Bld 5.7 (H) <5.7 % of total Hgb   Mean Plasma Glucose 117 mg/dL   eAG (mmol/L) 6.5 mmol/L  CBC with Differential/Platelet   Collection Time: 06/04/23  8:47 AM  Result Value Ref Range   WBC 8.4 3.8 - 10.8 Thousand/uL   RBC 5.28 (H) 3.80 - 5.10 Million/uL   Hemoglobin 14.9 11.7 - 15.5 g/dL   HCT 64.4 (H) 03.4 - 74.2 %   MCV 85.6 80.0 - 100.0 fL   MCH 28.2 27.0 - 33.0 pg   MCHC 33.0 32.0 - 36.0 g/dL   RDW 59.5 63.8 - 75.6 %   Platelets 205 140 - 400 Thousand/uL   MPV 12.9 (H) 7.5 - 12.5 fL   Neutro Abs 5,552 1,500 - 7,800 cells/uL   Absolute Lymphocytes 1,890 850 - 3,900 cells/uL   Absolute Monocytes 748 200 - 950 cells/uL   Eosinophils Absolute 151 15 - 500 cells/uL   Basophils Absolute 59 0 - 200 cells/uL   Neutrophils Relative % 66.1 %   Total Lymphocyte 22.5 %   Monocytes Relative 8.9 %   Eosinophils Relative 1.8 %   Basophils Relative 0.7 %      Assessment & Plan:   Problem List Items Addressed This Visit     Benign hypertension with  CKD (chronic kidney disease) stage III (HCC)   OSA on CPAP   Primary osteoarthritis of left knee   Type 2 diabetes mellitus with other specified complication (HCC) - Primary   Other Visit Diagnoses       Pre-operative clearance         Primary insomnia         Onychomycosis of left great toe         Long-term current use of injectable noninsulin antidiabetic medication            Pre-op clearance for non-cardiac surgery today, Left Total Knee Replacement TKR (intermediate risk), general anesthesia. - Previously tolerated prior surgeries with general anesthesia - No known cardiac hx. Appropriate functional status >4 METs - Former smoker quit 1984, essentially non smoker at this point, no COPD - She has OSA on CPAP  1. Cleared  for elective surgery. Completed provided pre-op paperwork per Dr Ellene Route [facility] office 2. Reviewed lab results. No abnormality that would delay surgery No required repeat urine, EKG or Chest X-ray  - Hold GLP-1 agonist (Mounjaro) two weeks prior to surgery due to full stomach precautions.  Type 2 Diabetes Mellitus Well-managed with A1c of 5.7. Patient is on Mounjaro. - Continue current management. - Address prior authorization for [facility] with insurance when next refill is needed.  L Great Toenail Fungus - Start topical OTC antifungal nail polish, apply for one year. - Reevaluate progress at next visit.  Sleep Apnea Patient reports poor sleep and daytime fatigue. Currently using a CPAP machine and interested in obtaining a travel CPAP machine.  Note the patient is using and benefiting from the CPAP machine. The patient is interested in a travel CPAP however this is not available. She would benefit from regular CPAP updated equipment.    Follow-up Schedule a follow-up visit in six months to monitor diabetes management and evaluate progress of toenail fungus treatment.         No orders of the defined types were placed in this encounter.   No orders of the defined types were placed in this encounter.   Follow up plan: Return in about 6 months (around 12/12/2023) for 6 month DM A1c.  Saralyn Pilar, DO [facility] 06/11/2023, 9:49 AM

## 2023-06-12 ENCOUNTER — Telehealth: Payer: Self-pay

## 2023-06-12 DIAGNOSIS — G4733 Obstructive sleep apnea (adult) (pediatric): Secondary | ICD-10-CM

## 2023-06-12 NOTE — Telephone Encounter (Signed)
 2 phone notes today for CPAP orders  [facility] order printed for home CPAP  Other phone note for [facility]AP Shop is Travel CPAP machine out of pocket.  Both CPAP orders ready to be faxed to each location  Saralyn Pilar, DO [facility] 06/12/2023, 1:06 PM

## 2023-06-12 NOTE — Addendum Note (Signed)
 Addended by: Smitty Cords on: 06/12/2023 01:06 PM   Modules accepted: Orders

## 2023-06-12 NOTE — Telephone Encounter (Signed)
 Copied from CRM [id number]. Topic: Clinical - Prescription Issue >> Jun 12, 2023 10:51 AM Kelsey Mendoza wrote: Reason for CRM: Patient called and has another script that needs to called in she said the CPAP machine she uses now has an error message stated it exceeded max limit and need another one ordered [facility] [phone] is the fax number. Please follow up with the patient.

## 2023-06-12 NOTE — Telephone Encounter (Signed)
 Copied from CRM [id number]. Topic: Clinical - Prescription Issue >> Jun 12, 2023 10:12 AM [name] wrote: Reason for CRM: The patient has called to request a prescription for a CPAP machine  The patient has stressed that their prescription will need to include Order #[id number]  Patient DOB Pressure Setting 9  EPR 1  [employer]   Please fax order to [facility] Shop at  [phone]  Attn Mena Pauls   Please contact the patient further if needed

## 2023-06-18 ENCOUNTER — Telehealth: Payer: Self-pay

## 2023-06-18 NOTE — Telephone Encounter (Signed)
 Copied from CRM [id number]. Topic: General - Other >> Jun 18, 2023 12:47 PM Higinio Roger wrote: Reason for CRM: Corrie Dandy from [facility] wanted to inform Saralyn Pilar, DO that they do not offer a travel CPAP machine.   Callback #: [phone]

## 2023-06-19 ENCOUNTER — Other Ambulatory Visit: Payer: Self-pay | Admitting: Orthopedic Surgery

## 2023-06-19 NOTE — Telephone Encounter (Signed)
 Spoke with patient, notified her that new order for home CPAP was sent in.

## 2023-06-19 NOTE — Telephone Encounter (Unsigned)
 Copied from CRM [id number]. Topic: Clinical - Medical Advice >> Jun 19, 2023  9:19 AM Gildardo Pounds wrote: Reason for CRM: Patient was working with Britt Boozer yesterday to get cpap machine. Callback number is [phone]

## 2023-06-22 ENCOUNTER — Telehealth: Payer: Self-pay

## 2023-06-22 NOTE — Telephone Encounter (Signed)
 Approved today by [facility] [employer] 2017 14-MAR-25:14-MAR-26 Mounjaro 15MG /0.5ML [place] [facility] Quantity:2;

## 2023-06-22 NOTE — Telephone Encounter (Signed)
 Kelsey Mendoza (Key: Peggye Ley) Rx #: [id number] Need Help? Call us at [phone] Status New (Not sent to plan) Drug Mounjaro 15MG /0.5ML auto-injectors ePA cloud logo Form [facility] [employer] [facility] Electronic Prior Authorization Form 2017 NCP[place] Original Claim Info [id number]

## 2023-06-25 ENCOUNTER — Telehealth: Payer: Self-pay

## 2023-06-25 NOTE — Telephone Encounter (Signed)
 Copied from CRM [id number]. Topic: Clinical - Prescription Issue >> Jun 25, 2023  1:48 PM Yolanda T wrote: Reason for CRM: Rainard called from [employer] [facility] about the orders for the CPAP machine and he needs to add the heated humidifier and and electronic signature. Chart notes dated 06/11/23 has some contradicitions, need confirmation that patient is benefiting from the CPAP machine and it is medically necessary. Please f/u with Rainard at [phone]

## 2023-06-25 NOTE — Telephone Encounter (Signed)
 Copied from CRM [id number]. Topic: Medical Record Request - Other >> Jun 25, 2023  2:00 PM Franchot Heidelberg wrote: Reason for CRM: Needs clinical notes updated from June 11 2023, needs to state that patient is using and benefiting from the CPAP machine. The patient is interested in a travel CPAP however they do not carry/supply this. Needs to state that a regular CPAP is fine. The Rx needs to state that the Rx electronically signed by the PCP.    [facility] contact: [phone] Fax: [phone]

## 2023-06-27 DIAGNOSIS — G4733 Obstructive sleep apnea (adult) (pediatric): Secondary | ICD-10-CM | POA: Diagnosis not present

## 2023-07-03 ENCOUNTER — Other Ambulatory Visit: Payer: Self-pay

## 2023-07-03 ENCOUNTER — Encounter
Admission: RE | Admit: 2023-07-03 | Discharge: 2023-07-03 | Disposition: A | Discharge status: Home / Self Care | Payer: Self-pay | Source: Ambulatory Visit | Attending: Orthopedic Surgery | Admitting: Orthopedic Surgery

## 2023-07-03 VITALS — BP 120/79 | HR 61 | Temp 97.8°F | Resp 12 | Ht 64.0 in | Wt 165.0 lb

## 2023-07-03 DIAGNOSIS — R8271 Bacteriuria: Secondary | ICD-10-CM | POA: Insufficient documentation

## 2023-07-03 DIAGNOSIS — R8281 Pyuria: Secondary | ICD-10-CM | POA: Diagnosis not present

## 2023-07-03 DIAGNOSIS — Z01812 Encounter for preprocedural laboratory examination: Secondary | ICD-10-CM

## 2023-07-03 DIAGNOSIS — Z01818 Encounter for other preprocedural examination: Secondary | ICD-10-CM | POA: Diagnosis not present

## 2023-07-03 DIAGNOSIS — R8290 Unspecified abnormal findings in urine: Secondary | ICD-10-CM | POA: Insufficient documentation

## 2023-07-03 DIAGNOSIS — M1712 Unilateral primary osteoarthritis, left knee: Secondary | ICD-10-CM | POA: Diagnosis not present

## 2023-07-03 HISTORY — DX: Unilateral primary osteoarthritis, left knee: M17.12

## 2023-07-03 HISTORY — DX: Essential (primary) hypertension: I10

## 2023-07-03 HISTORY — DX: Insomnia, unspecified: G47.00

## 2023-07-03 HISTORY — DX: Obstructive sleep apnea (adult) (pediatric): G47.33

## 2023-07-03 HISTORY — DX: Diverticulosis of intestine, part unspecified, without perforation or abscess without bleeding: K57.90

## 2023-07-03 HISTORY — DX: Unspecified cataract: H26.9

## 2023-07-03 HISTORY — DX: Type 2 diabetes mellitus without complications: E11.9

## 2023-07-03 HISTORY — DX: Other pericardial effusion (noninflammatory): I31.39

## 2023-07-03 HISTORY — DX: Chronic kidney disease, stage 3 unspecified: N18.30

## 2023-07-03 LAB — COMPREHENSIVE METABOLIC PANEL
ALT: 10 U/L (ref 0–44)
AST: 14 U/L — ABNORMAL LOW (ref 15–41)
Albumin: 3.9 g/dL (ref 3.5–5.0)
Alkaline Phosphatase: 80 U/L (ref 38–126)
Anion gap: 8 (ref 5–15)
BUN: 12 mg/dL (ref 8–23)
CO2: 29 mmol/L (ref 22–32)
Calcium: 9.9 mg/dL (ref 8.9–10.3)
Chloride: 101 mmol/L (ref 98–111)
Creatinine, Ser: 0.96 mg/dL (ref 0.44–1.00)
GFR, Estimated: >60 mL/min (ref >60)
Glucose, Bld: 78 mg/dL (ref 70–99)
Potassium: 3.2 mmol/L — ABNORMAL LOW (ref 3.5–5.1)
Sodium: 138 mmol/L (ref 135–145)
Total Bilirubin: 0.8 mg/dL (ref 0.0–1.2)
Total Protein: 7.1 g/dL (ref 6.5–8.1)

## 2023-07-03 LAB — URINALYSIS, ROUTINE W REFLEX MICROSCOPIC
Bilirubin Urine: NEGATIVE
Glucose, UA: NEGATIVE mg/dL
Hgb urine dipstick: NEGATIVE
Ketones, ur: NEGATIVE mg/dL
Nitrite: NEGATIVE
Protein, ur: NEGATIVE mg/dL
Specific Gravity, Urine: 1.016 (ref 1.005–1.030)
WBC, UA: >50 WBC/hpf (ref 0–5)
pH: 6 (ref 5.0–8.0)

## 2023-07-03 LAB — SURGICAL PCR SCREEN
MRSA, PCR: NEGATIVE
Staphylococcus aureus: NEGATIVE

## 2023-07-03 NOTE — Patient Instructions (Addendum)
 Your procedure is scheduled on: Monday, April 7 Report to the Registration Desk on the 1st floor of the [facility] To find out your arrival time, please call [phone] between 1PM - 3PM on: Friday, April 4 If your arrival time is 6:00 am, do not arrive before that time as the [facility] entrance doors do not open until 6:00 am.  REMEMBER: Instructions that are not followed completely may result in serious medical risk, up to and including death; or upon the discretion of your surgeon and anesthesiologist your surgery may need to be rescheduled.  Do not eat food after midnight the night before surgery.  No gum chewing or hard candies.  You may however, drink water up to 2 hours before you are scheduled to arrive for your surgery. Do not drink anything within 2 hours of your scheduled arrival time.  In addition, your doctor has ordered for you to drink the provided:  Gatorade G2 Drinking this carbohydrate drink up to two hours before surgery helps to reduce insulin resistance and improve patient outcomes. Please complete drinking 2 hours before scheduled arrival time.  One week prior to surgery: starting March 31 Stop Anti-inflammatories (NSAIDS) such as Advil, Aleve, Ibuprofen, Motrin, Naproxen, Naprosyn and Aspirin based products such as Excedrin, Goody's Powder, BC Powder. Stop ANY OVER THE COUNTER supplements until after surgery. Stop vitamin D and  B12.  You may however, continue to take Tylenol if needed for pain up until the day of surgery.  Mounjaro - hold for 7 days before surgery. Do NOT take dose on Thursday, April 3. Resume on your regular weekly day, Thursday, April 10.  Continue taking all of your other prescription medications up until the day of surgery.  ON THE DAY OF SURGERY ONLY TAKE THESE MEDICATIONS WITH SIPS OF WATER:  Amlodipine Rosuvastatin  No Alcohol for 24 hours before or after surgery.  No Smoking including e-cigarettes for 24 hours before  surgery.  No chewable tobacco products for at least 6 hours before surgery.  No nicotine patches on the day of surgery.  Do not use any "recreational" drugs for at least a week (preferably 2 weeks) before your surgery.  Please be advised that the combination of cocaine and anesthesia may have negative outcomes, up to and including death. If you test positive for cocaine, your surgery will be cancelled.  On the morning of surgery brush your teeth with toothpaste and water, you may rinse your mouth with mouthwash if you wish. Do not swallow any toothpaste or mouthwash.  Use CHG Soap as directed on instruction sheet.  Do not wear jewelry, make-up, hairpins, clips or nail polish.  For welded (permanent) jewelry: bracelets, anklets, waist bands, etc.  Please have this removed prior to surgery.  If it is not removed, there is a chance that hospital personnel will need to cut it off on the day of surgery.  Do not wear lotions, powders, or perfumes.   Do not shave body hair from the neck down 48 hours before surgery.  Contact lenses, hearing aids and dentures may not be worn into surgery.  Do not bring valuables to the hospital. [facility] is not responsible for any missing/lost belongings or valuables.   Bring your C-PAP to the hospital in case you may have to spend the night.   Notify your doctor if there is any change in your medical condition (cold, fever, infection).  Wear comfortable clothing (specific to your surgery type) to the hospital.  After  surgery, you can help prevent lung complications by doing breathing exercises.  Take deep breaths and cough every 1-2 hours. Your doctor may order a device called an Incentive Spirometer to help you take deep breaths.  If you are being admitted to the hospital overnight, leave your suitcase in the car. After surgery it may be brought to your room.  In case of increased patient census, it may be necessary for you, the patient, to continue  your postoperative care in the Same Day Surgery department.  If you are being discharged the day of surgery, you will not be allowed to drive home. You will need a responsible individual to drive you home and stay with you for 24 hours after surgery.   If you are taking public transportation, you will need to have a responsible individual with you.  Please call the Pre-admissions Testing Dept. at [phone] if you have any questions about these instructions.  Surgery Visitation Policy:  Patients having surgery or a procedure may have two visitors.  Children under the age of 19 must have an adult with them who is not the patient.  Temporary Visitor Restrictions Due to increasing cases of flu, RSV and COVID-19: Children ages 36 and under will not be able to visit patients in [facility] hospitals under most circumstances.  Inpatient Visitation:    Visiting hours are 7 a.m. to 8 p.m. Up to four visitors are allowed at one time in a patient room. The visitors may rotate out with other people during the day.  One visitor age 67 or older may stay with the patient overnight and must be in the room by 8 p.m.       Pre-operative 5 CHG Bath Instructions   You can play a key role in reducing the risk of infection after surgery. Your skin needs to be as free of germs as possible. You can reduce the number of germs on your skin by washing with CHG (chlorhexidine gluconate) soap before surgery. CHG is an antiseptic soap that kills germs and continues to kill germs even after washing.   DO NOT use if you have an allergy to chlorhexidine/CHG or antibacterial soaps. If your skin becomes reddened or irritated, stop using the CHG and notify one of our RNs at [phone].   Please shower with the CHG soap starting 4 days before surgery using the following schedule:     Please keep in mind the following:  DO NOT shave, including legs and underarms, starting the day of your first shower.    You may shave your face at any point before/day of surgery.  Place clean sheets on your bed the day you start using CHG soap. Use a clean washcloth (not used since being washed) for each shower. DO NOT sleep with pets once you start using the CHG.   CHG Shower Instructions:  If you choose to wash your hair and private area, wash first with your normal shampoo/soap.  After you use shampoo/soap, rinse your hair and body thoroughly to remove shampoo/soap residue.  Turn the water OFF and apply about 3 tablespoons (45 ml) of CHG soap to a CLEAN washcloth.  Apply CHG soap ONLY FROM YOUR NECK DOWN TO YOUR TOES (washing for 3-5 minutes)  DO NOT use CHG soap on face, private areas, open wounds, or sores.  Pay special attention to the area where your surgery is being performed.  If you are having back surgery, having someone wash your back for you may  be helpful. Wait 2 minutes after CHG soap is applied, then you may rinse off the CHG soap.  Pat dry with a clean towel  Put on clean clothes/pajamas   If you choose to wear lotion, please use ONLY the CHG-compatible lotions on the back of this paper.     Additional instructions for the day of surgery: DO NOT APPLY any lotions, deodorants, cologne, or perfumes.   Put on clean/comfortable clothes.  Brush your teeth.  Ask your nurse before applying any prescription medications to the skin.      CHG Compatible Lotions   Aveeno Moisturizing lotion  Cetaphil Moisturizing Cream  Cetaphil Moisturizing Lotion  Clairol Herbal Essence Moisturizing Lotion, Dry Skin  Clairol Herbal Essence Moisturizing Lotion, Extra Dry Skin  Clairol Herbal Essence Moisturizing Lotion, Normal Skin  Curel Age Defying Therapeutic Moisturizing Lotion with Alpha Hydroxy  Curel Extreme Care Body Lotion  Curel Soothing Hands Moisturizing Hand Lotion  Curel Therapeutic Moisturizing Cream, Fragrance-Free  Curel Therapeutic Moisturizing Lotion, Fragrance-Free  Curel  Therapeutic Moisturizing Lotion, Original Formula  Eucerin Daily Replenishing Lotion  Eucerin Dry Skin Therapy Plus Alpha Hydroxy Crme  Eucerin Dry Skin Therapy Plus Alpha Hydroxy Lotion  Eucerin Original Crme  Eucerin Original Lotion  Eucerin Plus Crme Eucerin Plus Lotion  Eucerin TriLipid Replenishing Lotion  Keri Anti-Bacterial Hand Lotion  Keri Deep Conditioning Original Lotion Dry Skin Formula Softly Scented  Keri Deep Conditioning Original Lotion, Fragrance Free Sensitive Skin Formula  Keri Lotion Fast Absorbing Fragrance Free Sensitive Skin Formula  Keri Lotion Fast Absorbing Softly Scented Dry Skin Formula  Keri Original Lotion  Keri Skin Renewal Lotion Keri Silky Smooth Lotion  Keri Silky Smooth Sensitive Skin Lotion  Nivea Body Creamy Conditioning Oil  Nivea Body Extra Enriched Lotion  Nivea Body Original Lotion  Nivea Body Sheer Moisturizing Lotion Nivea Crme  Nivea Skin Firming Lotion  NutraDerm 30 Skin Lotion  NutraDerm Skin Lotion  NutraDerm Therapeutic Skin Cream  NutraDerm Therapeutic Skin Lotion  ProShield Protective Hand Cream  Provon moisturizing lotion       Preoperative Educational Videos for Total Hip, Knee and Shoulder Replacements  To better prepare for surgery, please view our videos that explain the physical activity and discharge planning required to have the best surgical recovery at [facility].  [email or web address]  Questions? Call [phone] or email [email or web address]

## 2023-07-06 ENCOUNTER — Telehealth: Payer: Self-pay | Admitting: Urgent Care

## 2023-07-06 DIAGNOSIS — N390 Urinary tract infection, site not specified: Secondary | ICD-10-CM

## 2023-07-06 DIAGNOSIS — B9620 Unspecified Escherichia coli [E. coli] as the cause of diseases classified elsewhere: Secondary | ICD-10-CM

## 2023-07-06 DIAGNOSIS — E876 Hypokalemia: Secondary | ICD-10-CM

## 2023-07-06 DIAGNOSIS — Z01812 Encounter for preprocedural laboratory examination: Secondary | ICD-10-CM

## 2023-07-06 LAB — URINE CULTURE: Culture: >=100000 — AB

## 2023-07-06 MED ORDER — CEPHALEXIN 500 MG PO CAPS: 500.0000 mg | ORAL_CAPSULE | Freq: Two times a day (BID) | ORAL | 0 refills | Status: AC

## 2023-07-06 MED ORDER — POTASSIUM CHLORIDE ER 10 MEQ PO TBCR: 10.0000 meq | EXTENDED_RELEASE_TABLET | Freq: Every day | ORAL | 0 refills | Status: DC

## 2023-07-06 NOTE — Progress Notes (Addendum)
 [facility] Perioperative Services: Pre-Admission/Anesthesia Testing  Abnormal Lab Notification and Treatment Plan of Care   Date: 07/10/23  Name: Kelsey Mendoza MRN:   [id number]  Re: Abnormal labs noted during PAT appointment   Notified:  Provider Name Provider Role Notification Mode  Reinaldo Berber, MD Orthopedics (Surgeon) Routed and/or faxed via [facility]   Abnormal Lab Value(s):   Lab Results  Component Value Date   COLORURINE YELLOW (A) 07/03/2023   APPEARANCEUR CLOUDY (A) 07/03/2023   LABSPEC 1.016 07/03/2023   PHURINE 6.0 07/03/2023   GLUCOSEU NEGATIVE 07/03/2023   HGBUR NEGATIVE 07/03/2023   BILIRUBINUR NEGATIVE 07/03/2023   KETONESUR NEGATIVE 07/03/2023   PROTEINUR NEGATIVE 07/03/2023   NITRITE NEGATIVE 07/03/2023   LEUKOCYTESUR MODERATE (A) 07/03/2023   EPIU 0-5 07/03/2023   WBCU >50 07/03/2023   RBCU 0-5 07/03/2023   BACTERIA MANY (A) 07/03/2023   CULT (A) 07/03/2023    >=100,000 COLONIES/mL KLEBSIELLA PNEUMONIAE Two isolates with different morphologies were identified as the same organism.The most resistant organism was reported. Performed at [facility], [address], [place], [place] [address]     Clinical Information and Notes:  Patient is scheduled for an elective TOTAL KNEE ARTHROPLASTY on 07/16/2023.     UA performed in PAT consistent with/concerning for infection.  No leukocytosis noted on CBC; WBC 8.4 Renal function: Estimated Creatinine Clearance: 50.9 mL/min (by C-G formula based on SCr of 0.96 mg/dL). Urine C&S added to assess for pathogenically significant growth.  Impression and Plan:  Kelsey Mendoza with a UA that was (+) for infection; reflex culture sent. Contacted patient to discuss. Patient reporting that she is experiencing minor frequency and urgency. Suspect early/developing urinary tract infection. Patient with surgery scheduled soon. In efforts to avoid delaying patient's procedure, or have her  experience any potentially significant perioperative complications related to the aforementioned, I would like to proceed with empiric treatment for urinary tract infection.  Allergies reviewed. Culture report also reviewed to ensure culture appropriate coverage is being provided. Will treat with a 5 day course of CEPHALEXIN. Patient encouraged to complete the entire course of antibiotics even if she begins to feel better. She was advised that if culture demonstrates resistance to the prescribed antibiotic, she will be contacted and advised of the need to change the antibiotic being used to treat her infection.   Meds ordered this encounter  Medications   cephALEXin (KEFLEX) 500 MG capsule    Sig: Take 1 capsule (500 mg total) by mouth 2 (two) times daily for 5 days. Increase water intake while taking this medication.    Dispense:  10 capsule    Refill:  0    Please contact the patient as soon as it is available for pickup. Rx is for preoperative UTI treatment and needs to be started ASAP.   Patient encouraged to increase her fluid intake as much as possible. Discussed that water is always best to flush the urinary tract. She was advised to avoid caffeine containing fluids until her infections clears, as caffeine can cause her to experience painful bladder spasms.   May use Tylenol as needed for pain/fever should she experience these symptoms.   Patient instructed to call surgeon's office or PAT with any questions or concerns related to the above outlined course of treatment. Additionally, she was instructed to call if she feels like she is getting worse overall while on treatment. Results and treatment plan of care forwarded to primary attending surgeon to make them  aware.   Encounter Diagnoses  Name Primary?   Pre-operative laboratory examination Yes   UTI due to Klebsiella species    Quentin Mulling, MSN, APRN, FNP-C, CEN [facility]  Perioperative Services Nurse  Practitioner Phone: [phone] Fax: [phone] 07/10/23 9:14 AM  NOTE: This note has been prepared using Dragon dictation software. Despite my best ability to proofread, there is always the potential that unintentional transcriptional errors may still occur from this process.

## 2023-07-06 NOTE — Progress Notes (Signed)
 [facility] Perioperative Services: Pre-Admission/Anesthesia Testing  Abnormal Lab Notification and Treatment Plan of Care   Date: 07/06/23  Name: Kelsey Mendoza MRN:   [id number]  Re: Abnormal labs noted during PAT appointment   Notified:  Provider Name Provider Role Notification Mode  Reinaldo Berber, MD Orthopedics (Surgeon) Routed and/or faxed via Deanne Coffer, Netta Neat, DO Primary Care Provider Routed and/or faxed via [facility]   Clinical Information and Notes:  ABNORMAL LAB VALUE(S): Lab Results  Component Value Date   K 3.2 (L) 07/03/2023   Kelsey Mendoza is scheduled for an elective LEFT TOTAL KNEE ARTHROPLASTY on 07/16/2023. In review of her medication reconciliation, it is noted that the patient is not taking prescribed diuretic medications.  Please note, in efforts to promote a safe and effective anesthetic course, per current guidelines/standards set by the [facility] anesthesia team, the minimal acceptable K+ level for the patient to proceed with general anesthesia is 3.0 mmol/L. With that being said, if the patient drops any lower, her elective procedure will need to be postponed until K+ is better optimized. In efforts to prevent case cancellation, and ultimately to promote the safety of this patient undergoing sedation/anesthesia, will make efforts to optimize pre-surgical K+ level allowing the surgical intervention to proceed as planned.    Impression and Plan:  Kelsey Mendoza found to be HYPOkalemic at 3.2 mmol/L on preoperative labs.   She is not on diuretic therapy. In the absence of GI symptoms (diarrhea) and laxative use, likely nutritional etiology related to decreased intake of K+ rich food sources. Reviewed other potential insensible losses. Reviewed plans for preoperative optimization as follows:   Meds ordered this encounter  Medications   potassium chloride (KLOR-CON) 10 MEQ tablet    Sig: Take 1 tablet (10 mEq total) by mouth  daily.    Dispense:  10 tablet    Refill:  0    Please contact the patient as soon as it is available for pickup. Rx is for preoperative K+ optimization and needs to be started ASAP.   Encouraged patient to follow up with PCP about 2-3 weeks postoperatively to have labs rechecked to ensure that levels are remaining within normal range. Discussed nutritional intake of K+ rich foods as an adjunctive way to keep her K+ levels normal; list of K+ rich foods provided. Also mentioned ORS, however advised her not to rely solely on these drinks, as they are high in Na+, and she has a HTN diagnosis.   Will send copy of this note to surgeon and PCP to make them aware of K+ level and plans for correction. Discussed that PCP may elect to pursue adding a daily K+ supplement if levels remain low on recheck. Order entered to recheck K+ on the day of her surgery to ensure optimization. Wished patient the best of luck with her upcoming surgery and subsequent recovery. She was encouraged to return call to the PAT clinic, or to her surgeon's office, should any questions or concerns arise between now and the time of her surgery. Patient was appreciative of the care/concern expressed by PAT staff.   Encounter Diagnoses  Name Primary?   Pre-operative laboratory examination Yes   Hypokalemia    Quentin Mulling, MSN, APRN, FNP-C, CEN [facility]  Perioperative Services Nurse Practitioner Phone: [phone] 07/06/23 12:04 PM  NOTE: This note has been prepared using Dragon dictation software. Despite my best ability to proofread, there is always the potential that unintentional transcriptional  errors may still occur from this process.

## 2023-07-06 NOTE — Addendum Note (Signed)
 Addended by: Quentin Mulling on: 07/06/2023 12:10 PM   Modules accepted: Orders

## 2023-07-10 ENCOUNTER — Other Ambulatory Visit: Payer: PRIVATE HEALTH INSURANCE

## 2023-07-10 ENCOUNTER — Encounter: Payer: Self-pay | Admitting: Orthopedic Surgery

## 2023-07-10 NOTE — Progress Notes (Incomplete)
 Perioperative / Anesthesia Services  Pre-Admission Testing Clinical Review / Pre-Operative Anesthesia Consult  Date: 07/10/23  Patient Demographics:  Name: Kelsey Mendoza DOB: [birth year] MRN:   [id number]  Planned Surgical Procedure(s):    Case: [id number] Date/Time: 07/16/23 0955   Procedure: ARTHROPLASTY, KNEE, TOTAL (Left: Knee)   Anesthesia type: Choice   Diagnosis:      Primary osteoarthritis of left knee [M17.12]     Left knee pain, unspecified chronicity [M25.562]   Pre-op diagnosis:      Primary osteoarthritis of left knee M17.12     Left knee pain, unspecified chronicity M25.562   Location: [facility] OR ROOM 02 / [facility]   Surgeons: Reinaldo Berber, MD      NOTE: Available PAT nursing documentation and vital signs have been reviewed. Clinical nursing staff has updated patient's PMH/PSHx, current medication list, and drug allergies/intolerances to ensure comprehensive history available to assist in medical decision making as it pertains to the aforementioned surgical procedure and anticipated anesthetic course. Extensive review of available clinical information personally performed. [place] PMH and PSHx updated with any diagnoses/procedures that  may have been inadvertently omitted during his intake with the pre-admission testing department's nursing staff.  Clinical Discussion:  Kelsey Mendoza is a 73 y.o. female who is submitted for pre-surgical anesthesia review and clearance prior to her undergoing the above procedure. Patient is a Former Smoker (quit 02/1983). Pertinent PMH includes: CAD, diastolic dysfunction, pericardial effusion, HTN, HLD, T2DM, CKD-III, COPD, psoriasis, OA, insomnia.  Patient is followed by cardiology Juliann Pares, MD). She was last seen in the cardiology clinic on 08/17/2022; notes reviewed. At the time of her clinic visit, patient doing well overall from a cardiovascular perspective.  Patient complained of increased fatigue.   Patient denied any chest pain, shortness of breath, PND, orthopnea, palpitations, significant peripheral edema, weakness, vertiginous symptoms, or presyncope/syncope. Patient with a past medical history significant for cardiovascular diagnoses. Documented physical exam was grossly benign, providing no evidence of acute exacerbation and/or decompensation of the patient's known cardiovascular conditions.  Most recent TTE performed on 02/07/2022 revealed a normal left ventricular systolic function with an EF of >55%. There were no regional wall motion abnormalities. Left ventricular diastolic Doppler parameters consistent with abnormal relaxation (G1DD). Right ventricular size and function normal. There was trivial mitral and pulmonary in addition to mild tricuspid valve regurgitation.  RVSP = 27 mmHg.  All transvalvular gradients were noted to be normal providing no evidence suggestive of valvular stenosis. Aorta normal in size with no evidence of ectasia or aneurysmal dilatation.  Most recent myocardial perfusion imaging study was performed on 07/13/2022 revealing a normal left ventricular systolic function with a hyperdynamic EF of 67%.  There were no regional wall motion abnormalities.  No artifact or left ventricular cavity size enlargement appreciated on review of imaging. SPECT images demonstrate large reversible perfusion abnormality of moderate intensity is present in the anterior region on the stress images. TID ratio = 1.03.  Study suggestive of ischemia and felt to be high risk.  Coronary CTA was performed on 07/20/2022 that demonstrated an Agatston coronary artery calcium score of 105. This placed patient in the 60th percentile for age, sex, and race matched controls. Calcium depositions noted to be isolated mainly in the proximal LAD and RCA (<25%) distributions.  Study demonstrates normal coronary origin with RIGHT dominance.  Blood pressure elevated at 150/80 mmHg on currently prescribed  diuretic (furosemide) and CCB (amlodipine) therapies.  Patient is on rosuvastatin for her  HLD diagnosis and ASCVD prevention. T2DM well controlled on currently prescribed regimen; last HgbA1c was 5.7% when checked on 06/04/2023. She does not have an OSAH diagnosis. Patient is able to complete all of her  ADL/IADLs without cardiovascular limitation.  Per the [facility], patient is able to achieve at least 4 METS of physical activity without experiencing any significant degree of angina/anginal equivalent symptoms.  No changes were made to her medication regimen during her visit with cardiology.  Patient scheduled to follow-up with outpatient cardiology in 1 year or sooner if needed.  Kelsey Mendoza is scheduled for an elective ARTHROPLASTY, KNEE, TOTAL (Left: Knee) on 07/16/2023 with Dr. Reinaldo Berber, MD. Given patient's past medical history significant for cardiovascular diagnoses, presurgical cardiac clearance was sought by the PAT team.  Additionally, patient surgeons office requested clearance from patient's primary care provider.  Clearances were obtained as follows.  Per internal/family medicine Althea Charon, DO), "this patient is optimized for surgery from a medical perspective and may proceed with the planned procedural course at an overall LOW risk of perioperative complications".  Per cardiology Juliann Pares, MD), "this patient is optimized for surgery and may proceed with the planned procedural course with a LOW risk of significant perioperative cardiovascular complications".  In review of her medication reconciliation, the patient is not noted to be taking any type of anticoagulation or antiplatelet therapies that would need to be held during her perioperative course.  Patient denies previous perioperative complications with anesthesia in the past. In review her EMR, it is noted that patient underwent a general anesthetic course at [facility] ([facility]) in 05/27/2021 without documented  complications.      07/03/2023    8:56 AM 06/11/2023    9:21 AM 12/13/2022    6:43 AM  Vitals with BMI  Height 5\' 4"  5\' 4"    Weight 165 lbs 166 lbs   BMI 28.31 28.48   Systolic 120 122 161  Diastolic 79 80 82  Pulse 61 64    Providers/Specialists:  NOTE: Primary physician provider listed below. Patient may have been seen by APP or partner within same practice.   PROVIDER ROLE / SPECIALTY LAST Wilber Bihari, MD Orthopedics (Surgeon) 07/03/2023  Smitty Cords, DO Primary Care Provider 06/11/2023  Rudean Hitt, MD Cardiology 08/17/2022   Allergies:   Allergies  Allergen Reactions   Trazodone Other (See Comments)    Caused violent, disturbing nightmares    Current Home Medications:   No current facility-administered medications for this encounter.    amLODipine (NORVASC) 5 MG tablet   amoxicillin (AMOXIL) 500 MG tablet   Cholecalciferol (VITAMIN D3) 50 MCG (2000 UT) capsule   Cyanocobalamin (B-12 PO)   Deucravacitinib (SOTYKTU) 6 MG TABS   furosemide (LASIX) 40 MG tablet   MOUNJARO 15 MG/0.5ML Pen   rosuvastatin (CRESTOR) 5 MG tablet   cephALEXin (KEFLEX) 500 MG capsule   diphenhydramine-acetaminophen (TYLENOL PM) 25-500 MG TABS tablet   potassium chloride (KLOR-CON) 10 MEQ tablet   History:   Past Medical History:  Diagnosis Date   Bladder cancer (HCC) 2000   resection x 3 and BCG therapy   CAD (coronary artery disease)    Cataracts, bilateral    Chronic kidney disease, stage 3 (HCC)    Diastolic dysfunction    Diverticulosis    Fatigue    HLD (hyperlipidemia)    HTN (hypertension)    Insomnia    Long-term current use of immunosuppressive biologic agent    a.) deucravacitinib  Obstructive sleep apnea with dependence on continuous positive airway pressure (CPAP)    Pericardial effusion    Primary osteoarthritis of left knee    Psoriasis of scalp    a.) Tx'd with deucravacitinib   Type 2 diabetes mellitus (HCC)    Past Surgical  History:  Procedure Laterality Date   BLADDER SURGERY     x 3 resection bladder cancer   BROW LIFT Bilateral 05/27/2021   Procedure: BLEPHAROPLASTY UPPER EYELID; W/EXCESS SKIN AND BROW PTOSIS REPAIR BILATERAL;  Surgeon: Imagene Riches, MD;  Location: [facility];  Service: Ophthalmology;  Laterality: Bilateral;   CARPAL TUNNEL RELEASE Right 06/06/2017   Procedure: CARPAL TUNNEL RELEASE;  Surgeon: Juanell Fairly, MD;  Location: [facility];  Service: Orthopedics;  Laterality: Right;   CATARACT EXTRACTION W/ INTRAOCULAR LENS  IMPLANT, BILATERAL Bilateral 08/2019   COLONOSCOPY  07/16/2012   EYE SURGERY Left 11/26/2019   Dr. Luciana Axe, Vit, Mem Glynda Jaeger   I & D EXTREMITY Right 06/06/2017   Procedure: IRRIGATION AND DEBRIDEMENT EXTREMITY--RIGHT ARM;  Surgeon: Juanell Fairly, MD;  Location: [facility];  Service: Orthopedics;  Laterality: Right;   KNEE ARTHROSCOPY Left 10/06/2019   Procedure: ARTHROSCOPY KNEE,PARTIAL MEDIAL MENISECTOMY,PARTIAL CHONDROPLASTY;  Surgeon: Donato Heinz, MD;  Location: [facility];  Service: Orthopedics;  Laterality: Left;   LUMBAR SPINE SURGERY     MEDIAL PARTIAL KNEE REPLACEMENT Right 12/29/2016   Makoplasty   OPEN REDUCTION INTERNAL FIXATION (ORIF) DISTAL RADIAL FRACTURE N/A 06/06/2017   Procedure: OPEN REDUCTION INTERNAL FIXATION (ORIF) DISTAL RADIAL FRACTURE;  Surgeon: Juanell Fairly, MD;  Location: [facility];  Service: Orthopedics;  Laterality: N/A;   VAGINAL HYSTERECTOMY     VITRECTOMY Bilateral    Family History  Problem Relation Age of Onset   Hypertension Mother    Hypertension Father    CAD Father    Breast cancer Neg Hx    Social History   Tobacco Use   Smoking status: Former    Current packs/day: 0.00    Types: Cigarettes    Quit date: 03/01/1983    Years since quitting: 40.3   Smokeless tobacco: Never  Substance Use Topics   Alcohol use: No   Pertinent Clinical Results:  LABS:  Hospital Outpatient Visit on 07/03/2023  Component Date  Value Ref Range Status   MRSA, PCR 07/03/2023 NEGATIVE  NEGATIVE Final   Staphylococcus aureus 07/03/2023 NEGATIVE  NEGATIVE Final   Comment: (NOTE) The Xpert SA Assay (FDA approved for NASAL specimens in patients 55 years of age and older), is one component of a comprehensive surveillance program. It is not intended to diagnose infection nor to guide or monitor treatment. Performed at [facility], [address] [place], [place] [address]    Sodium 07/03/2023 138  135 - 145 mmol/L Final   Potassium 07/03/2023 3.2 (L)  3.5 - 5.1 mmol/L Final   Chloride 07/03/2023 101  98 - 111 mmol/L Final   CO2 07/03/2023 29  22 - 32 mmol/L Final   Glucose, Bld 07/03/2023 78  70 - 99 mg/dL Final   Glucose reference range applies only to samples taken after fasting for at least 8 hours.   BUN 07/03/2023 12  8 - 23 mg/dL Final   Creatinine, Ser 07/03/2023 0.96  0.44 - 1.00 mg/dL Final   Calcium 98/02/9146 9.9  8.9 - 10.3 mg/dL Final   Total Protein 82/95/6213 7.1  6.5 - 8.1 g/dL Final   Albumin 08/65/7846 3.9  3.5 - 5.0 g/dL Final  AST 07/03/2023 14 (L)  15 - 41 U/L Final   ALT 07/03/2023 10  0 - 44 U/L Final   Alkaline Phosphatase 07/03/2023 80  38 - 126 U/L Final   Total Bilirubin 07/03/2023 0.8  0.0 - 1.2 mg/dL Final   GFR, Estimated 07/03/2023 >60  >60 mL/min Final   Comment: (NOTE) Calculated using the CKD-EPI Creatinine Equation (2021)    Anion gap 07/03/2023 8  5 - 15 Final   Performed at [facility], [address] [place], [place] [address]   Color, Urine 07/03/2023 YELLOW (A)  YELLOW Final   APPearance 07/03/2023 CLOUDY (A)  CLEAR Final   Specific Gravity, Urine 07/03/2023 1.016  1.005 - 1.030 Final   pH 07/03/2023 6.0  5.0 - 8.0 Final   Glucose, UA 07/03/2023 NEGATIVE  NEGATIVE mg/dL Final   Hgb urine dipstick 07/03/2023 NEGATIVE  NEGATIVE Final   Bilirubin Urine 07/03/2023 NEGATIVE  NEGATIVE Final   Ketones, ur 07/03/2023 NEGATIVE  NEGATIVE mg/dL Final    Protein, ur 60/45/4098 NEGATIVE  NEGATIVE mg/dL Final   Nitrite 11/91/4782 NEGATIVE  NEGATIVE Final   Leukocytes,Ua 07/03/2023 MODERATE (A)  NEGATIVE Final   RBC / HPF 07/03/2023 0-5  0 - 5 RBC/hpf Final   WBC, UA 07/03/2023 >50  0 - 5 WBC/hpf Final   Bacteria, UA 07/03/2023 MANY (A)  NONE SEEN Final   Squamous Epithelial / HPF 07/03/2023 0-5  0 - 5 /HPF Final   Performed at [facility], [address], [place], [place] [address]   Specimen Description 07/03/2023    Final                   Value:URINE, CLEAN CATCH Performed at [facility], [address], [place], [place] [address]    Special Requests 07/03/2023    Final                   Value:NONE Performed at [facility], [address] [place], [place] [address]    Culture 07/03/2023  (A)   Final                   Value:>=100,000 COLONIES/mL KLEBSIELLA PNEUMONIAE Two isolates with different morphologies were identified as the same organism.The most resistant organism was reported. Performed at [facility], [address], [place], [place] [address]    Report Status 07/03/2023 07/06/2023 FINAL   Final   Organism ID, Bacteria 07/03/2023 KLEBSIELLA PNEUMONIAE (A)   Final    ECG: Date: 07/03/2023  Time ECG obtained: 0857 AM Rate: 59 bpm Rhythm: sinus bradycardia Axis (leads I and aVF): normal Intervals: PR 180 ms. QRS 108 ms. QTc 419 ms. ST segment and T wave changes: Nonspecific ST and T wave abnormalities  Evidence of a possible, age undetermined, prior infarct:  No Comparison: Similar to previous tracing obtained on 07/28/2021   IMAGING / PROCEDURES: DIAGNOSTIC RADIOGRAPHS OF LEFT 4+ VIEWS performed on 05/25/2023 Moderate to severe tricompartmental degenerative changes worse in the medial and patellofemoral joints with bone-on-bone articulation, osteophyte formation in all 3 compartments with sclerosis and cystic changes.  Kellgren-Lawrence grade 4.     AP, sunrise, and flexed PA of  the right knee also show status post medial compartment partial knee arthroplasty with some mild degenerative changes in the lateral  compartment and moderate changes in the patellofemoral compartment with joint space narrowing osteophyte formation and sclerosis.   No peri-hardware or osteolysis noted or fractures.  CT  CORONARY MORPH W/CTA COR W/SCORE W/CA W/CM &/OR WO/CM performed on 07/20/2022 No acute extracardiac abnormality.   Coronary calcium score of 105. This was 60th percentile for age and sex matched control. Normal coronary origin with right dominance. Minimal stenosis in proximal LAD and RCA (<25%). CAD-RADS 1. Minimal non-obstructive CAD (0-24%). Consider non-atherosclerotic causes of chest pain. Consider preventive therapy and risk factor modification.  MYOCARDIAL PERFUSION IMAGING STUDY (LEXISCAN) performed on 07/13/2022 Normal left ventricular systolic function with a normal LVEF of 67% Normal myocardial thickening and wall motion Left ventricular cavity size normal SPECT images demonstrate large reversible perfusion abnormality of moderate intensity is present in the anterior region on the stress  TID ratio = 1.03 This is a high risk study suggestive of ischemia recommend invasive further evaluation   TRANSTHORACIC ECHOCARDIOGRAM performed on 02/07/2022 Normal left ventricular systolic function with an EF of >55% Left ventricular diastolic Doppler parameters consistent with abnormal relaxation (G1DD). Normal right ventricular systolic function Trivial MR and PR Mild TR RVSP = 27.0 mmHg Normal transvalvular gradients; no valvular stenosis No pericardial effusion  Impression and Plan:  Kelsey Mendoza has been referred for pre-anesthesia review and clearance prior to her undergoing the planned anesthetic and procedural courses. Available labs, pertinent testing, and imaging results were personally reviewed by me in preparation for upcoming operative/procedural course.  [facility] medical record has been updated following extensive record review and patient interview with PAT staff.   Preoperative labs indicated the patient had a preoperative urinary tract infection.  Culture and sensitivity report grew out Klebsiella pneumoniae.  Allergies were reviewed.  Patient treated with a culture appropriate 5-day course of CEPHALEXIN 500 mg twice daily.  She was encouraged to increase her fluid intake while taking the prescribed antimicrobial therapy. Patient to contact PAT office with any questions or concerns while on treatment.  Preoperative K+ levels found to be low at 3.2 mmol/L. Patient denies GI symptoms. She is on daily loop diuretic therapy. Prescription sent in for K-Dur 10 mEq to be taken once daily for a total of 10 days. Patient will take a dose on the morning of her procedure. We will plan on rechecking K+ levels prior to patient's surgery to ensure that she is safe to proceed with the planned surgical/anesthetic course. Patient and surgeon have been updated on the plan of care as it stands at this point.   This patient has been appropriately cleared by cardiology (LOW) and by internal/family medicine (LOW) with the individually indicated risk of patient experiencing significant perioperative complications. Based on clinical review performed today (07/10/23), barring any significant acute changes in the patient's overall condition, it is anticipated that she will be able to proceed with the planned surgical intervention. Any acute changes in clinical condition may necessitate her procedure being postponed and/or cancelled. Patient will meet with anesthesia team (MD and/or CRNA) on the day of her procedure for preoperative evaluation/assessment. Questions regarding anesthetic course will be fielded at that time.   Pre-surgical instructions were reviewed with the patient during his PAT appointment, and questions were fielded to satisfaction by PAT clinical staff. She  has been instructed on which medications that she will need to hold prior to surgery, as well as the ones that have been deemed safe/appropriate to take on the day of her procedure. As part of the general education provided by PAT, patient made aware both verbally and in writing, that she would need to abstain from the use of any illegal substances during her perioperative  course. She was advised that failure to follow the provided instructions could necessitate case cancellation or result in serious perioperative complications up to and including death. Patient encouraged to contact PAT and/or her surgeon's office to discuss any questions or concerns that may arise prior to surgery; verbalized understanding.   Quentin Mulling, MSN, APRN, FNP-C, CEN [facility]  Perioperative Services Nurse Practitioner Phone: [phone] Fax: [phone] 07/10/23 8:55 AM  NOTE: This note has been prepared using Dragon dictation software. Despite my best ability to proofread, there is always the potential that unintentional transcriptional errors may still occur from this process.

## 2023-07-16 ENCOUNTER — Ambulatory Visit: Payer: Self-pay | Admitting: Urgent Care

## 2023-07-16 ENCOUNTER — Encounter: Payer: Self-pay | Admitting: Orthopedic Surgery

## 2023-07-16 ENCOUNTER — Ambulatory Visit

## 2023-07-16 ENCOUNTER — Other Ambulatory Visit: Payer: Self-pay

## 2023-07-16 ENCOUNTER — Encounter
Admission: RE | Disposition: A | Discharge status: Home Health | Payer: Self-pay | Source: Ambulatory Visit | Attending: Orthopedic Surgery

## 2023-07-16 ENCOUNTER — Ambulatory Visit
Admission: RE | Admit: 2023-07-16 | Discharge: 2023-07-17 | Disposition: A | Discharge status: Home Health | Payer: PRIVATE HEALTH INSURANCE | Source: Ambulatory Visit | Attending: Orthopedic Surgery | Admitting: Orthopedic Surgery

## 2023-07-16 DIAGNOSIS — Z7984 Long term (current) use of oral hypoglycemic drugs: Secondary | ICD-10-CM | POA: Diagnosis not present

## 2023-07-16 DIAGNOSIS — Z87891 Personal history of nicotine dependence: Secondary | ICD-10-CM | POA: Insufficient documentation

## 2023-07-16 DIAGNOSIS — R609 Edema, unspecified: Secondary | ICD-10-CM | POA: Diagnosis not present

## 2023-07-16 DIAGNOSIS — I129 Hypertensive chronic kidney disease with stage 1 through stage 4 chronic kidney disease, or unspecified chronic kidney disease: Secondary | ICD-10-CM | POA: Diagnosis not present

## 2023-07-16 DIAGNOSIS — Z01812 Encounter for preprocedural laboratory examination: Secondary | ICD-10-CM

## 2023-07-16 DIAGNOSIS — M1712 Unilateral primary osteoarthritis, left knee: Secondary | ICD-10-CM | POA: Diagnosis not present

## 2023-07-16 DIAGNOSIS — I2510 Atherosclerotic heart disease of native coronary artery without angina pectoris: Secondary | ICD-10-CM | POA: Diagnosis not present

## 2023-07-16 DIAGNOSIS — K219 Gastro-esophageal reflux disease without esophagitis: Secondary | ICD-10-CM | POA: Insufficient documentation

## 2023-07-16 DIAGNOSIS — J449 Chronic obstructive pulmonary disease, unspecified: Secondary | ICD-10-CM | POA: Diagnosis not present

## 2023-07-16 DIAGNOSIS — G4733 Obstructive sleep apnea (adult) (pediatric): Secondary | ICD-10-CM | POA: Diagnosis not present

## 2023-07-16 DIAGNOSIS — Z471 Aftercare following joint replacement surgery: Secondary | ICD-10-CM | POA: Diagnosis not present

## 2023-07-16 DIAGNOSIS — M1990 Unspecified osteoarthritis, unspecified site: Secondary | ICD-10-CM | POA: Diagnosis not present

## 2023-07-16 DIAGNOSIS — Z79899 Other long term (current) drug therapy: Secondary | ICD-10-CM | POA: Insufficient documentation

## 2023-07-16 DIAGNOSIS — E876 Hypokalemia: Secondary | ICD-10-CM

## 2023-07-16 DIAGNOSIS — N183 Chronic kidney disease, stage 3 unspecified: Secondary | ICD-10-CM | POA: Insufficient documentation

## 2023-07-16 DIAGNOSIS — Z7985 Long-term (current) use of injectable non-insulin antidiabetic drugs: Secondary | ICD-10-CM | POA: Insufficient documentation

## 2023-07-16 DIAGNOSIS — Z96652 Presence of left artificial knee joint: Secondary | ICD-10-CM | POA: Diagnosis not present

## 2023-07-16 DIAGNOSIS — E1122 Type 2 diabetes mellitus with diabetic chronic kidney disease: Secondary | ICD-10-CM | POA: Diagnosis not present

## 2023-07-16 HISTORY — DX: Other fatigue: R53.83

## 2023-07-16 HISTORY — DX: Other ill-defined heart diseases: I51.89

## 2023-07-16 HISTORY — DX: Long term (current) use of immunosuppressive biologic: Z79.620

## 2023-07-16 HISTORY — PX: TOTAL KNEE ARTHROPLASTY: SHX125

## 2023-07-16 HISTORY — DX: Essential (primary) hypertension: I10

## 2023-07-16 HISTORY — DX: Cataract extraction status, right eye: Z98.41

## 2023-07-16 HISTORY — DX: Atherosclerotic heart disease of native coronary artery without angina pectoris: I25.10

## 2023-07-16 LAB — POCT I-STAT, CHEM 8
BUN: 14 mg/dL (ref 8–23)
Calcium, Ion: 1.23 mmol/L (ref 1.15–1.40)
Chloride: 107 mmol/L (ref 98–111)
Creatinine, Ser: 1.1 mg/dL — ABNORMAL HIGH (ref 0.44–1.00)
Glucose, Bld: 93 mg/dL (ref 70–99)
HCT: 45 % (ref 36.0–46.0)
Hemoglobin: 15.3 g/dL — ABNORMAL HIGH (ref 12.0–15.0)
Potassium: 4.1 mmol/L (ref 3.5–5.1)
Sodium: 140 mmol/L (ref 135–145)
TCO2: 24 mmol/L (ref 22–32)

## 2023-07-16 LAB — GLUCOSE, CAPILLARY: Glucose-Capillary: 122 mg/dL — ABNORMAL HIGH (ref 70–99)

## 2023-07-16 SURGERY — ARTHROPLASTY, KNEE, TOTAL
Anesthesia: Spinal | Site: Knee | Laterality: Left

## 2023-07-16 MED ORDER — EPHEDRINE SULFATE-NACL 50-0.9 MG/10ML-% IV SOSY
PREFILLED_SYRINGE | INTRAVENOUS | Status: DC | PRN
  Administered 2023-07-16: 5 mg via INTRAVENOUS

## 2023-07-16 MED ORDER — PROPOFOL 1000 MG/100ML IV EMUL
INTRAVENOUS | Status: AC
  Filled 2023-07-16: qty 100

## 2023-07-16 MED ORDER — CEFAZOLIN SODIUM-DEXTROSE 2-4 GM/100ML-% IV SOLN
2.0000 g | Freq: Four times a day (QID) | INTRAVENOUS | Status: AC
  Administered 2023-07-16 – 2023-07-17 (×2): 2 g via INTRAVENOUS
  Filled 2023-07-16: qty 100

## 2023-07-16 MED ORDER — ONDANSETRON HCL 4 MG/2ML IJ SOLN
INTRAMUSCULAR | Status: AC
  Filled 2023-07-16: qty 2

## 2023-07-16 MED ORDER — AMLODIPINE BESYLATE 5 MG PO TABS
5.0000 mg | ORAL_TABLET | Freq: Every day | ORAL | Status: DC
  Administered 2023-07-17: 5 mg via ORAL

## 2023-07-16 MED ORDER — KETOROLAC TROMETHAMINE 15 MG/ML IJ SOLN
INTRAMUSCULAR | Status: AC
  Filled 2023-07-16: qty 1

## 2023-07-16 MED ORDER — SODIUM CHLORIDE 0.9 % IV SOLN: INTRAVENOUS | Status: DC

## 2023-07-16 MED ORDER — DEXAMETHASONE SODIUM PHOSPHATE 10 MG/ML IJ SOLN
INTRAMUSCULAR | Status: AC
  Filled 2023-07-16: qty 1

## 2023-07-16 MED ORDER — PHENYLEPHRINE HCL-NACL 20-0.9 MG/250ML-% IV SOLN
INTRAVENOUS | Status: DC | PRN
  Administered 2023-07-16: 40 ug/min via INTRAVENOUS

## 2023-07-16 MED ORDER — BUPIVACAINE HCL (PF) 0.5 % IJ SOLN
INTRAMUSCULAR | Status: DC | PRN
  Administered 2023-07-16: 2.4 mL via INTRATHECAL

## 2023-07-16 MED ORDER — FENTANYL CITRATE (PF) 100 MCG/2ML IJ SOLN
INTRAMUSCULAR | Status: AC
  Filled 2023-07-16: qty 2

## 2023-07-16 MED ORDER — METOCLOPRAMIDE HCL 5 MG PO TABS: 5.0000 mg | ORAL_TABLET | Freq: Three times a day (TID) | ORAL | Status: DC | PRN

## 2023-07-16 MED ORDER — ACETAMINOPHEN 500 MG PO TABS
ORAL_TABLET | ORAL | Status: AC
  Filled 2023-07-16: qty 2

## 2023-07-16 MED ORDER — PHENYLEPHRINE HCL (PRESSORS) 10 MG/ML IV SOLN
INTRAVENOUS | Status: AC
  Filled 2023-07-16: qty 1

## 2023-07-16 MED ORDER — SODIUM CHLORIDE 0.9 % IR SOLN
Status: DC | PRN
  Administered 2023-07-16: 3000 mL

## 2023-07-16 MED ORDER — ORAL CARE MOUTH RINSE: 15.0000 mL | Freq: Once | OROMUCOSAL | Status: AC

## 2023-07-16 MED ORDER — MIDAZOLAM HCL 2 MG/2ML IJ SOLN
INTRAMUSCULAR | Status: AC
  Filled 2023-07-16: qty 2

## 2023-07-16 MED ORDER — ACETAMINOPHEN 500 MG PO TABS
1000.0000 mg | ORAL_TABLET | Freq: Three times a day (TID) | ORAL | Status: DC
  Administered 2023-07-16 – 2023-07-17 (×3): 1000 mg via ORAL
  Filled 2023-07-16 (×2): qty 2

## 2023-07-16 MED ORDER — ACETAMINOPHEN 325 MG PO TABS: 325.0000 mg | ORAL_TABLET | Freq: Four times a day (QID) | ORAL | Status: DC | PRN

## 2023-07-16 MED ORDER — HYDROCODONE-ACETAMINOPHEN 5-325 MG PO TABS
1.0000 | ORAL_TABLET | ORAL | Status: DC | PRN
  Administered 2023-07-16: 2 via ORAL
  Administered 2023-07-17: 1 via ORAL
  Filled 2023-07-16 (×2): qty 2

## 2023-07-16 MED ORDER — PROPOFOL 10 MG/ML IV BOLUS
INTRAVENOUS | Status: AC
  Filled 2023-07-16: qty 20

## 2023-07-16 MED ORDER — CEFAZOLIN SODIUM-DEXTROSE 2-4 GM/100ML-% IV SOLN
INTRAVENOUS | Status: AC
  Filled 2023-07-16: qty 100

## 2023-07-16 MED ORDER — TRAMADOL HCL 50 MG PO TABS
50.0000 mg | ORAL_TABLET | Freq: Four times a day (QID) | ORAL | Status: DC | PRN
  Administered 2023-07-16 – 2023-07-17 (×2): 50 mg via ORAL
  Filled 2023-07-16: qty 1

## 2023-07-16 MED ORDER — CHLORHEXIDINE GLUCONATE 0.12 % MT SOLN
OROMUCOSAL | Status: AC
Start: 2023-07-16 — End: ?
  Filled 2023-07-16: qty 15

## 2023-07-16 MED ORDER — PANTOPRAZOLE SODIUM 40 MG PO TBEC
40.0000 mg | DELAYED_RELEASE_TABLET | Freq: Every day | ORAL | Status: DC
  Administered 2023-07-17: 40 mg via ORAL
  Filled 2023-07-16: qty 1

## 2023-07-16 MED ORDER — TRANEXAMIC ACID-NACL 1000-0.7 MG/100ML-% IV SOLN
INTRAVENOUS | Status: AC
  Filled 2023-07-16: qty 100

## 2023-07-16 MED ORDER — SODIUM CHLORIDE (PF) 0.9 % IJ SOLN
INTRAMUSCULAR | Status: DC | PRN
  Administered 2023-07-16: 70 mL

## 2023-07-16 MED ORDER — ROSUVASTATIN CALCIUM 5 MG PO TABS
5.0000 mg | ORAL_TABLET | Freq: Every day | ORAL | Status: DC
  Administered 2023-07-17: 5 mg via ORAL
  Filled 2023-07-16: qty 1

## 2023-07-16 MED ORDER — EPHEDRINE 5 MG/ML INJ
INTRAVENOUS | Status: AC
  Filled 2023-07-16: qty 5

## 2023-07-16 MED ORDER — ACETAMINOPHEN 10 MG/ML IV SOLN
INTRAVENOUS | Status: AC
  Filled 2023-07-16: qty 100

## 2023-07-16 MED ORDER — MIDAZOLAM HCL 5 MG/5ML IJ SOLN
INTRAMUSCULAR | Status: DC | PRN
  Administered 2023-07-16: 2 mg via INTRAVENOUS

## 2023-07-16 MED ORDER — SURGIPHOR WOUND IRRIGATION SYSTEM - OPTIME
TOPICAL | Status: DC | PRN
  Administered 2023-07-16: 450 mL via TOPICAL

## 2023-07-16 MED ORDER — BUPIVACAINE LIPOSOME 1.3 % IJ SUSP
INTRAMUSCULAR | Status: AC
  Filled 2023-07-16: qty 20

## 2023-07-16 MED ORDER — PROPOFOL 500 MG/50ML IV EMUL
INTRAVENOUS | Status: DC | PRN
  Administered 2023-07-16: 100 ug/kg/min via INTRAVENOUS

## 2023-07-16 MED ORDER — MENTHOL 3 MG MT LOZG: 1.0000 | LOZENGE | OROMUCOSAL | Status: DC | PRN

## 2023-07-16 MED ORDER — TRANEXAMIC ACID-NACL 1000-0.7 MG/100ML-% IV SOLN
1000.0000 mg | INTRAVENOUS | Status: AC
Start: 2023-07-16 — End: 2023-07-16
  Administered 2023-07-16 (×2): 1000 mg via INTRAVENOUS

## 2023-07-16 MED ORDER — MORPHINE SULFATE (PF) 4 MG/ML IV SOLN: 0.5000 mg | INTRAVENOUS | Status: DC | PRN

## 2023-07-16 MED ORDER — ONDANSETRON HCL 4 MG/2ML IJ SOLN
INTRAMUSCULAR | Status: DC | PRN
  Administered 2023-07-16: 4 mg via INTRAVENOUS

## 2023-07-16 MED ORDER — BUPIVACAINE-EPINEPHRINE (PF) 0.25% -1:200000 IJ SOLN
INTRAMUSCULAR | Status: AC
  Filled 2023-07-16: qty 30

## 2023-07-16 MED ORDER — KETOROLAC TROMETHAMINE 15 MG/ML IJ SOLN
7.5000 mg | Freq: Four times a day (QID) | INTRAMUSCULAR | Status: DC
  Administered 2023-07-16 – 2023-07-17 (×3): 7.5 mg via INTRAVENOUS
  Filled 2023-07-16 (×2): qty 1

## 2023-07-16 MED ORDER — ONDANSETRON HCL 4 MG PO TABS: 4.0000 mg | ORAL_TABLET | Freq: Four times a day (QID) | ORAL | Status: DC | PRN

## 2023-07-16 MED ORDER — DOCUSATE SODIUM 100 MG PO CAPS
100.0000 mg | ORAL_CAPSULE | Freq: Two times a day (BID) | ORAL | Status: DC
  Administered 2023-07-17: 100 mg via ORAL
  Filled 2023-07-16: qty 1

## 2023-07-16 MED ORDER — SODIUM CHLORIDE (PF) 0.9 % IJ SOLN
INTRAMUSCULAR | Status: AC
  Filled 2023-07-16: qty 20

## 2023-07-16 MED ORDER — CHLORHEXIDINE GLUCONATE 0.12 % MT SOLN
15.0000 mL | Freq: Once | OROMUCOSAL | Status: AC
  Administered 2023-07-16: 15 mL via OROMUCOSAL

## 2023-07-16 MED ORDER — CEFAZOLIN SODIUM-DEXTROSE 2-4 GM/100ML-% IV SOLN
2.0000 g | INTRAVENOUS | Status: AC
  Administered 2023-07-16: 2 g via INTRAVENOUS

## 2023-07-16 MED ORDER — DEXAMETHASONE SODIUM PHOSPHATE 10 MG/ML IJ SOLN
8.0000 mg | Freq: Once | INTRAMUSCULAR | Status: AC
  Administered 2023-07-16: 10 mg via INTRAVENOUS

## 2023-07-16 MED ORDER — TRAMADOL HCL 50 MG PO TABS
ORAL_TABLET | ORAL | Status: AC
  Filled 2023-07-16: qty 1

## 2023-07-16 MED ORDER — ONDANSETRON HCL 4 MG/2ML IJ SOLN: 4.0000 mg | Freq: Four times a day (QID) | INTRAMUSCULAR | Status: DC | PRN

## 2023-07-16 MED ORDER — ENOXAPARIN SODIUM 30 MG/0.3ML IJ SOSY
30.0000 mg | PREFILLED_SYRINGE | Freq: Two times a day (BID) | INTRAMUSCULAR | Status: DC
  Administered 2023-07-17: 30 mg via SUBCUTANEOUS
  Filled 2023-07-16: qty 0.3

## 2023-07-16 MED ORDER — ACETAMINOPHEN 500 MG PO TABS
1000.0000 mg | ORAL_TABLET | Freq: Once | ORAL | Status: AC
  Administered 2023-07-16: 1000 mg via ORAL

## 2023-07-16 MED ORDER — FENTANYL CITRATE (PF) 100 MCG/2ML IJ SOLN: 25.0000 ug | INTRAMUSCULAR | Status: DC | PRN

## 2023-07-16 MED ORDER — BUPIVACAINE HCL (PF) 0.5 % IJ SOLN
INTRAMUSCULAR | Status: AC
Start: 2023-07-16 — End: ?
  Filled 2023-07-16: qty 10

## 2023-07-16 MED ORDER — PHENOL 1.4 % MT LIQD: 1.0000 | OROMUCOSAL | Status: DC | PRN

## 2023-07-16 MED ORDER — METOCLOPRAMIDE HCL 5 MG/ML IJ SOLN: 5.0000 mg | Freq: Three times a day (TID) | INTRAMUSCULAR | Status: DC | PRN

## 2023-07-16 SURGICAL SUPPLY — 68 items
BLADE PATELLA REAM PILOT HOLE (MISCELLANEOUS) IMPLANT
BLADE SAGITTAL AGGR TOOTH XLG (BLADE) IMPLANT
BLADE SAW SAG 25X90X1.19 (BLADE) ×1 IMPLANT
BLADE SAW SAG 29X58X.64 (BLADE) ×1 IMPLANT
BNDG ELASTIC 6INX 5YD STR LF (GAUZE/BANDAGES/DRESSINGS) ×1 IMPLANT
BOWL CEMENT MIX W/ADAPTER (MISCELLANEOUS) IMPLANT
BRUSH SCRUB EZ PLAIN DRY (MISCELLANEOUS) ×1 IMPLANT
CHLORAPREP W/TINT 26 (MISCELLANEOUS) ×2 IMPLANT
COMP FEM KNEE PS STD 6 LT (Joint) ×1 IMPLANT
COMP PATELLA PEG 3 32 (Joint) ×1 IMPLANT
COMPONENT FEM KNEE PS STD 6 LT (Joint) IMPLANT
COMPONENT PATELLA PEG 3 32 (Joint) IMPLANT
COOLER POLAR GLACIER W/PUMP (MISCELLANEOUS) ×1 IMPLANT
CUFF TRNQT CYL 24X4X16.5-23 (TOURNIQUET CUFF) IMPLANT
CUFF TRNQT CYL 30X4X21-28X (TOURNIQUET CUFF) IMPLANT
DERMABOND ADVANCED .7 DNX12 (GAUZE/BANDAGES/DRESSINGS) ×1 IMPLANT
DRAPE SHEET LG 3/4 BI-LAMINATE (DRAPES) ×2 IMPLANT
DRSG MEPILEX SACRM 8.7X9.8 (GAUZE/BANDAGES/DRESSINGS) ×1 IMPLANT
DRSG OPSITE POSTOP 4X10 (GAUZE/BANDAGES/DRESSINGS) IMPLANT
DRSG OPSITE POSTOP 4X8 (GAUZE/BANDAGES/DRESSINGS) IMPLANT
ELECT REM PT RETURN 9FT ADLT (ELECTROSURGICAL) ×1 IMPLANT
ELECTRODE REM PT RTRN 9FT ADLT (ELECTROSURGICAL) ×1 IMPLANT
GLOVE BIO SURGEON STRL SZ8 (GLOVE) ×1 IMPLANT
GLOVE BIOGEL PI IND STRL 8 (GLOVE) ×1 IMPLANT
GLOVE PI ORTHO PRO STRL 7.5 (GLOVE) ×2 IMPLANT
GLOVE PI ORTHO PRO STRL SZ8 (GLOVE) ×2 IMPLANT
GLOVE SURG SYN 7.5 E (GLOVE) ×1 IMPLANT
GLOVE SURG SYN 7.5 PF PI (GLOVE) ×1 IMPLANT
GOWN SRG XL LVL 3 NONREINFORCE (GOWNS) ×1 IMPLANT
GOWN STRL REUS W/ TWL LRG LVL3 (GOWN DISPOSABLE) ×1 IMPLANT
GOWN STRL REUS W/ TWL XL LVL3 (GOWN DISPOSABLE) ×1 IMPLANT
HOOD PEEL AWAY T7 (MISCELLANEOUS) ×2 IMPLANT
INSERT FIXED AS SZ 6-7 13 LT (Insert) IMPLANT
KIT TURNOVER KIT A (KITS) ×1 IMPLANT
MANIFOLD NEPTUNE II (INSTRUMENTS) ×1 IMPLANT
MARKER SKIN DUAL TIP RULER LAB (MISCELLANEOUS) ×1 IMPLANT
MAT ABSORB FLUID 56X50 GRAY (MISCELLANEOUS) ×1 IMPLANT
NDL HYPO 21X1.5 SAFETY (NEEDLE) ×1 IMPLANT
NEEDLE HYPO 21X1.5 SAFETY (NEEDLE) ×1 IMPLANT
PACK TOTAL KNEE (MISCELLANEOUS) ×1 IMPLANT
PAD ARMBOARD POSITIONER FOAM (MISCELLANEOUS) ×3 IMPLANT
PAD WRAPON POLAR KNEE (MISCELLANEOUS) ×1 IMPLANT
PENCIL SMOKE EVACUATOR (MISCELLANEOUS) ×1 IMPLANT
PIN DRILL HDLS TROCAR 75 4PK (PIN) IMPLANT
PULSAVAC PLUS IRRIG FAN TIP (DISPOSABLE) ×1 IMPLANT
SCREW FEMALE HEX FIX 25X2.5 (ORTHOPEDIC DISPOSABLE SUPPLIES) IMPLANT
SCREW HEX HEADED 3.5X27 DISP (ORTHOPEDIC DISPOSABLE SUPPLIES) IMPLANT
SLEEVE SCD COMPRESS KNEE MED (STOCKING) ×1 IMPLANT
SOL .9 NS 3000ML IRR UROMATIC (IV SOLUTION) ×1 IMPLANT
SOLUTION IRRIG SURGIPHOR (IV SOLUTION) ×1 IMPLANT
STEM TIB PERS SZ E 5D LT (Screw) IMPLANT
STOCKINETTE IMPERV 14X48 (MISCELLANEOUS) ×1 IMPLANT
SUT STRATA 1 CT-1 DLB (SUTURE) ×1 IMPLANT
SUT STRATAFIX 14 PDO 36 VLT (SUTURE) ×1 IMPLANT
SUT STRATAFIX PDO 1 14 VIOLET (SUTURE) ×1 IMPLANT
SUT VIC AB 0 CT1 36 (SUTURE) ×1 IMPLANT
SUT VIC AB 2-0 CT2 27 (SUTURE) ×2 IMPLANT
SUT VICRYL 1-0 27IN ABS (SUTURE) ×1 IMPLANT
SUTURE STRATA 1 CT-1 DLB (SUTURE) ×1 IMPLANT
SUTURE STRATA SPIR 4-0 18 (SUTURE) ×1 IMPLANT
SUTURE VICRYL 1-0 27IN ABS (SUTURE) ×1 IMPLANT
SYR 20ML LL LF (SYRINGE) ×2 IMPLANT
TAPE CLOTH 3X10 WHT NS LF (GAUZE/BANDAGES/DRESSINGS) ×1 IMPLANT
TIP FAN IRRIG PULSAVAC PLUS (DISPOSABLE) ×1 IMPLANT
TOWEL OR 17X26 4PK STRL BLUE (TOWEL DISPOSABLE) IMPLANT
TRAP FLUID SMOKE EVACUATOR (MISCELLANEOUS) ×1 IMPLANT
WATER STERILE IRR 1000ML POUR (IV SOLUTION) ×1 IMPLANT
WRAPON POLAR PAD KNEE (MISCELLANEOUS) ×1 IMPLANT

## 2023-07-16 NOTE — Anesthesia Procedure Notes (Signed)
 Spinal  Patient location during procedure: OR Start time: 07/16/2023 1:38 PM End time: 07/16/2023 1:45 PM Reason for block: surgical anesthesia Staffing Performed: resident/CRNA  Resident/CRNA: Morene Crocker, CRNA Performed by: Morene Crocker, CRNA Authorized by: Lenard Simmer, MD   Preanesthetic Checklist Completed: patient identified, IV checked, site marked, risks and benefits discussed, surgical consent, monitors and equipment checked, pre-op evaluation and timeout performed Spinal Block Patient position: sitting Prep: ChloraPrep Patient monitoring: heart rate, continuous pulse ox and blood pressure Approach: midline Location: L2-3 Injection technique: single-shot Needle Needle type: Introducer and Pencan  Needle gauge: 24 G Needle length: 9 cm Assessment Sensory level: T10 Events: CSF return Additional Notes Negative paresthesia. Negative blood return. Positive free-flowing CSF. Expiration date of kit checked and confirmed. Patient tolerated procedure well, without complications. Successful on first attempt, no complications noted. Pt. Tolerated procedure well without any c/o pain/discomfort.

## 2023-07-16 NOTE — Anesthesia Postprocedure Evaluation (Signed)
 Anesthesia Post Note  Patient: Kelsey Mendoza  Procedure(s) Performed: ARTHROPLASTY, KNEE, TOTAL (Left: Knee)  Patient location during evaluation: PACU Anesthesia Type: Spinal Level of consciousness: awake and alert Pain management: pain level controlled Vital Signs Assessment: post-procedure vital signs reviewed and stable Respiratory status: spontaneous breathing, nonlabored ventilation, respiratory function stable and patient connected to nasal cannula oxygen Cardiovascular status: blood pressure returned to baseline and stable Postop Assessment: no apparent nausea or vomiting Anesthetic complications: no   No notable events documented.   Last Vitals:  Vitals:   07/16/23 1050  BP: (!) 158/86  Pulse: 64  Resp: 16  Temp: 37.1 C  SpO2: 98%    Last Pain:  Vitals:   07/16/23 1216  TempSrc:   PainSc: 7                  Yevette Edwards

## 2023-07-16 NOTE — Progress Notes (Signed)
 Patient ambulated to BR with two assists using walker, voided clear yellow urine.

## 2023-07-16 NOTE — Anesthesia Preprocedure Evaluation (Signed)
 Anesthesia Evaluation  Patient identified by MRN, date of birth, ID band Patient awake    Reviewed: Allergy & Precautions, H&P , NPO status , reviewed documented beta blocker date and time   History of Anesthesia Complications Negative for: history of anesthetic complications  Airway Mallampati: III  TM Distance: >3 FB     Dental  (+) Upper Dentures, Partial Lower, Dental Advidsory Given   Pulmonary neg shortness of breath, sleep apnea and Continuous Positive Airway Pressure Ventilation , neg COPD, neg recent URI, former smoker   Pulmonary exam normal        Cardiovascular Exercise Tolerance: Good hypertension, (-) angina (-) Past MI and (-) Cardiac Stents Normal cardiovascular exam(-) dysrhythmias (-) Valvular Problems/Murmurs     Neuro/Psych  PSYCHIATRIC DISORDERS  Depression    negative neurological ROS     GI/Hepatic Neg liver ROS,GERD  Controlled,,  Endo/Other  diabetes    Renal/GU Renal disease (stage 3 CKD)     Musculoskeletal   Abdominal   Peds  Hematology negative hematology ROS (+)   Anesthesia Other Findings Past Medical History: 2000s: Cancer (HCC)     Comment:  bladder No date: COPD (chronic obstructive pulmonary disease) (HCC) No date: CPAP (continuous positive airway pressure) dependence No date: HLD (hyperlipidemia) No date: Hypertension No date: Knee pain No date: Pre-diabetes No date: Psoriasis of scalp No date: Sleep apnea   Reproductive/Obstetrics                             Anesthesia Physical Anesthesia Plan  ASA: II  Anesthesia Plan: Spinal   Post-op Pain Management:    Induction: Intravenous  PONV Risk Score and Plan: 3 and 4 or greater and Propofol infusion, TIVA and Treatment may vary due to age or medical condition  Airway Management Planned: Natural Airway and Simple Face Mask  Additional Equipment:   Intra-op Plan:   Post-operative Plan:    Informed Consent: I have reviewed the patients History and Physical, chart, labs and discussed the procedure including the risks, benefits and alternatives for the proposed anesthesia with the patient or authorized representative who has indicated his/her understanding and acceptance.     Dental Advisory Given  Plan Discussed with: CRNA  Anesthesia Plan Comments:         Anesthesia Quick Evaluation

## 2023-07-16 NOTE — Discharge Instructions (Signed)

## 2023-07-16 NOTE — Op Note (Signed)
 Patient Name: Kelsey Mendoza  ZOX:[id number]  Pre-Operative Diagnosis: Left knee Osteoarthritis  Post-Operative Diagnosis: (same)  Procedure: Left Total Knee Arthroplasty  Components/Implants: Femur: Persona Size 6 CR PPS   Tibia: Persona Size E osseoti  Poly: 13mm MC  Patella: 32x9.14mm symmetric porous  Femoral Valgus Cut Angle: 5 degrees  Distal Femoral Re-cut: none  Patella Resurfacing: yes   Date of Surgery: 07/16/2023  Surgeon: Reinaldo Berber MD  Assistant: Amador Cunas PA (present and scrubbed throughout the case, critical for assistance with exposure, retraction, instrumentation, and closure)   Anesthesiologist: Karlton Lemon  Anesthesia: Spinal   Tourniquet Time: 52 min  EBL: 50cc  IVF: 400cc  Complications: None   Brief history: The patient is a 73 year old female with a history of osteoarthritis of the left knee with pain limiting their range of motion and activities of daily living, which has failed multiple attempts at conservative therapy.  The risks and benefits of total knee arthroplasty as definitive surgical treatment were discussed with the patient, who opted to proceed with the operation.  After outpatient medical clearance and optimization was completed the patient was admitted to [facility] for the procedure.  All preoperative films were reviewed and an appropriate surgical plan was made prior to surgery. Preoperative range of motion was 0 to 115.   Description of procedure: The patient was brought to the operating room where laterality was confirmed by all those present to be the left side.   Spinal anesthesia was administered and the patient received an intravenous dose of antibiotics for surgical prophylaxis and a dose of tranexamic acid.  Patient is positioned supine on the operating room table with all bony prominences well-padded.  A well-padded tourniquet was applied to the left thigh.  The knee was then prepped and draped in usual  sterile fashion with multiple layers of adhesive and nonadhesive drapes.  All of those present in the operating room participated in a surgical timeout laterality and patient were confirmed.   An Esmarch was wrapped around the extremity and the leg was elevated and the knee flexed.  The tourniquet was inflated to a pressure of 250 mmHg. The Esmarch was removed and the leg was brought down to full extension.  The patella and tibial tubercle identified and outlined using a marking pen and a midline skin incision was made with a knife carried through the subcutaneous tissue down to the extensor retinaculum.  After exposure of the extensor mechanism the medial parapatellar arthrotomy was performed with a scalpel and electrocautery extending down medial and distal to the tibial tubercle taking care to avoid incising the patellar tendon.   A standard medial release was performed over the proximal tibia.  The knee was brought into extension in order to excise the fat pad taking care not to damage the patella tendon.  The superior soft tissue was removed from the anterior surface of the distal femur to visualize for the procedure.  The knee was then brought into flexion with the patella subluxed laterally and subluxing the tibia anteriorly.  The ACL was transected and removed with electrocautery and additional soft tissue was removed from the proximal surface of the tibia to fully expose. The PCL was found to be intact and was preserved.  An extramedullary tibial cutting guide was then applied to the leg with a spring-loaded ankle clamp placed around the distal tibia just above the malleoli the angulation of the guide was adjusted to give some posterior slope in the tibial resection with an  appropriate varus/valgus alignment.  The resection guide was then pinned to the proximal tibia and the proximal tibial surface was resected with an oscillating saw.  Careful attention was paid to ensure the blade did not disrupt any  of the soft tissues including any lateral or medial ligament.  Attention was then turned to the femur, with the knee slightly flexed a opening drill was used to enter the medullary canal of the femur.  After removing the drill marrow was suctioned out to decompress the distal femur.  An intramedullary femoral guide was then inserted into the drill hole and the alignment guide was seated firmly against the distal end of the medial femoral condyle.  The distal femoral cutting guide was then attached and pinned securely to the anterior surface of the femur and the intramedullary rod and alignment guide was removed.  Distal femur resection was then performed with an oscillating saw with retractors protecting medial and laterally.   The distal cutting block was then removed and the extension gap was checked with a spacer.  Extension gap was found to be appropriately sized to accommodate the spacer block.   The femoral sizing guide was then placed securely into the posterior condyles of the femur and the femoral size was measured and determined to be 6.  The size 6; 4-in-1 cutting guide was placed in position and secured with 2 pins.  The anterior posterior and chamfer resections were then performed with an oscillating saw.  Bony fragments and osteophytes were then removed.  Using a lamina spreader the posterior medial and lateral condyles were checked for additional osteophytes and posterior soft tissue remnants.  Any remaining meniscus was removed at this time.  Periarticular injection was performed in the meniscal rims and posterior capsule with aspiration performed to ensure no intravascular injection.   The tibia was then exposed and the tibial trial was pinned onto the plateau after confirming appropriate orientation and rotation.  Using the drill bushing the tibia was prepared to the appropriate drill depth.  Tibial broach impactor was then driven through the punch guide using a mallet.  The femoral trial  component was then inserted onto the femur. A trial tibial polyethylene bearing was then placed and the knee was reduced.  The knee achieved full extension with no hyperextension and was found to be balanced in flexion and extension with the trials in place.  The knee was then brought into full extension the patella was everted and held with 2 Kocher clamps.  The articular surface of the patella was then resected with an patella reamer and saw after careful measurement with a caliper.  The patella was then prepared with the drill guide and a trial patella was placed.  The knee was then taken through range of motion and it was found that the patella articulated appropriately with the trochlea and good patellofemoral motion without subluxation.    The correct final components for implantation were confirmed and opened by the circulator nurse.  The knee was irrigated with normal saline via pulsatile lavage to remove any bony debris or soft tissue.  The prepared surface of the tibia was exposed and the tibial component was implanted with good bony contact.  The femoral component was then placed and impacted showing good coverage and a good snug fit.  The patella was then cleared off and the patella compression tool was used to apply the patellar component with symmetric compression onto the patella.  The tibial component was then irrigated and cleared  of any debris and a real polyethylene component was placed and engaged with the locking mechanism.  The knee was then injected with the particular cocktail.  The knee was taken through range of motion and found to be stable in flexion and extension with patellar tracking.  The knee was then irrigated with copious amount of normal saline via pulsatile lavage to remove all loose bodies and other debris.  The knee was then irrigated with surgiphor betadine based wash and reirrigated with saline.  The tourniquet was then dropped and all bleeding vessels were identified and  coagulated.  The arthrotomy was approximated with #1 Vicryl and closed with #1 Stratafix suture.  The knee was brought into slight flexion and the subcutaneous tissues were closed with 0 Vicryl, 2-0 Vicryl and a running subcuticular 4-0 stratafix barbed suture.  Skin was then glued with Dermabond.  A sterile adhesive dressing was then placed along with a sequential compression device to the calf, a Ted stocking, and a cryotherapy cuff.   Sponge, needle, and Lap counts were all correct at the end of the case.   The patient was transferred off of the operating room table to a hospital bed, good pulses were found distally on the operative side.  The patient was transferred to the recovery room in stable condition.

## 2023-07-16 NOTE — Progress Notes (Signed)
 PT Cancellation Note  Patient Details Name: Kelsey Mendoza MRN: [id number] DOB: 05-23-1950   Cancelled Treatment:    Reason Eval/Treat Not Completed: Other (comment).  PT consult received.  Chart reviewed.  Pt s/p L TKA.  Discussed pt's status with nursing (pt in PACU); pt does not have adequate LE strength return (s/p spinal anesthesia) to safely participate in therapy at this time.  Will re-attempt PT evaluation tomorrow.  Hendricks Limes, PT 07/16/23, 4:29 PM

## 2023-07-16 NOTE — Transfer of Care (Signed)
 Immediate Anesthesia Transfer of Care Note  Patient: Kelsey Mendoza  Procedure(s) Performed: ARTHROPLASTY, KNEE, TOTAL (Left: Knee)  Patient Location: PACU  Anesthesia Type:Spinal  Level of Consciousness: awake, alert , and drowsy  Airway & Oxygen Therapy: Patient Spontanous Breathing and Patient connected to face mask oxygen  Post-op Assessment: Report given to RN and Post -op Vital signs reviewed and stable  Post vital signs: Reviewed and stable  Last Vitals:  Vitals Value Taken Time  BP 107/64 07/16/23 1546  Temp 35.8 1546  Pulse 75 07/16/23 1549  Resp 25 07/16/23 1549  SpO2 99 % 07/16/23 1549  Vitals shown include unfiled device data.  Last Pain:  Vitals:   07/16/23 1216  TempSrc:   PainSc: 7          Complications: No notable events documented.

## 2023-07-16 NOTE — H&P (Signed)
 History of Present Illness: The patient is an 73 y.o. female who Kelsey Mendoza for history and physical for left total knee arthroplasty with Dr. Audelia Acton on 07/16/2023. Patient is having severe pain in her left knee that is been present for years. She has medial anterior and lateral aspect pain of the knee. She describes swelling, instability. X-rays show advanced tricompartmental degenerative changes with complete loss of joint space in the medial compartment of the left knee. She has tried anti-inflammatory medications, home exercises, injections with diminishing results. Her pain is interfering with her quality of life and activities daily living. She is having a hard time walking short distances.  The patient denies fevers, chills, numbness, tingling, shortness of breath, chest pain, recent illness, or any trauma.  The patient is a non-smoker, nondiabetic with a last hemoglobin A1c of 5.9, a BMI of 29.3 and denies any history of metal allergy.  Past Medical History: Past Medical History:  Diagnosis Date  Borderline diabetes  A1C 6.1%- 07/2013; diet-controlled  Broken wrist, right, sequela  Essential hypertension 09/03/2013  Hx of bladder cancer  Hyperlipidemia  LDL 159- 07/2013  Hypertension  Insomnia  Menopausal syndrome  Hx of menopausal syndrome  Obesity  Osteoarthritis  Sleep apnea  Uses CPAP machine   Past Surgical History: Past Surgical History:  Procedure Laterality Date  COLONOSCOPY 07/16/12  diverticulosis in sigmoid, descending, transverse, and ascending colon and cecum  JOINT REPLACEMENT Right 12/29/2016  Right medial Makoplasty  ORIF DISTAL RADIUS FRACTURE 06/06/2017  Dr. Elgie Congo  CATARACT EXTRACTION W/ INTRAOCULAR LENS IMPLANT & ANTERIOR VITRECTOMY, BILATERAL 08/2019  Right 08/19/19. Left 09/02/19  Left knee arthroscopy, partial medial meniscectomy, and chondroplasty 10/06/2019  Dr Ernest Pine  BIOPSY SKIN ARM Bilateral  Left arm- Precancerous  BIOPSY SKIN LEG Right  Right  leg-Cancerous  Bladder cancer surgeries  x 3  HYSTERECTOMY  Vaginal hysterectomy  Lumbar back surgery  Right knee surgery Right  TURP VAPORIZATION   Past Family History: Family History  Problem Relation Age of Onset  Stroke Mother  Heart disease Mother  Myocardial Infarction (Heart attack) Mother  Heart disease Father  No Known Problems Sister  No Known Problems Brother   Medications: Current Outpatient Medications  Medication Sig Dispense Refill  amLODIPine (NORVASC) 5 MG tablet Take 1 tablet (5 mg total) by mouth once daily 90 tablet 3  amoxicillin (AMOXIL) 500 MG capsule TAKE 4 CAPSULES BY MOUTH 1 HOUR PRIOR TO DENTAL WORK  cholecalciferol 1000 unit tablet Take 1,000 Units by mouth once daily  diclofenac (VOLTAREN) 1 % topical gel Apply 2 g topically 4 (four) times daily  FUROsemide (LASIX) 40 MG tablet TAKE 1 TABLET (40 MG TOTAL) BY MOUTH ONCE DAILY AS NEEDED 90 tablet 1  gabapentin (NEURONTIN) 100 MG capsule Take 2 capsules (200 mg total) by mouth 2 (two) times daily 360 capsule 3  hydrocortisone 2.5 % cream APPLY 1 (ONE) APPLICATION(S) TOPICAL 2 (TWO) TIMES A DAY TO FACE  ketoconazole (NIZORAL) 2 % shampoo WASH SCALP 3(THREE) TIMES A WEEK. LEAVE IT PLACE FOR 30 MINUTES BEFORE RINSING.  levothyroxine (SYNTHROID) 25 MCG tablet Take 1 tablet by mouth every morning before breakfast  losartan (COZAAR) 100 MG tablet take 1 tablet by mouth every day 90 tablet 3  metFORMIN (GLUCOPHAGE-XR) 500 MG XR tablet Take 500 mg by mouth daily with dinner  metoprolol TARTrate (LOPRESSOR) 100 MG tablet TAKE TABLET TWO HOURS PRIOR TO YOUR CARDIAC CT SCAN.  MOUNJARO 7.5 mg/0.5 mL PnIj INJECT 7.5 MG SUBCUTANEOUSLY WEEKLY  omeprazole (  PRILOSEC) 40 MG DR capsule TAKE 1 CAPSULE BY MOUTH EVERY DAY BEFORE BREAKFAST  rosuvastatin (CRESTOR) 5 MG tablet Take 1 tablet (5 mg total) by mouth once daily 90 tablet 3  zolpidem (AMBIEN CR) 6.25 MG CR tablet Take 1 tablet by mouth at bedtime as needed   No  current facility-administered medications for this visit.   Allergies: Allergies  Allergen Reactions  Trazodone Other (See Comments)  Caused violent, disturbing nightmares    Visit Vitals: Vitals:  07/03/23 0916  BP: (!) 144/82    Review of Systems:  A comprehensive 14 point ROS was performed, reviewed, and the pertinent orthopaedic findings are documented in the HPI.  Physical Exam: General:  Well developed, well nourished, no apparent distress, normal affect, normal gait with mild antalgic gait.  HEENT: Head normocephalic, atraumatic, PERRL.   Abdomen: Soft, non tender, non distended, Bowel sounds present.  Heart: Examination of the heart reveals regular, rate, and rhythm. There is no murmur noted on ascultation. There is a normal apical pulse.  Lungs: Lungs are clear to auscultation. There is no wheeze, rhonchi, or crackles. There is normal expansion of bilateral chest walls.   Comprehensive Knee Exam: Gait Mildly antalgic on the left  Alignment Neutral   Inspection Left  Skin Normal appearance with no obvious deformity. Healed arthroscopic portals. No ecchymosis or erythema.  Soft Tissue No focal soft tissue swelling  Quad Atrophy None   Palpation  Left  Tenderness Medial joint line, lateral joint line, and peripatellar tenderness to palpation  Crepitus + patellofemoral and tibiofemoral crepitus  Effusion None   Range of Motion Left  Flexion 0-115  Extension Full knee extension without hyperextension   Ligamentous Exam Left  Lachman Normal  Valgus 0 Normal  Valgus 30 Normal  Varus 0 Normal  Varus 30 Normal  Anterior Drawer Normal  Posterior Drawer Normal   Meniscal Exam Left  Hyperflexion Test Positive  Hyperextension Test Positive  McMurray's Positive   Neurovascular Left  Quadriceps Strength 5/5  Hamstring Strength 5/5  Hip Abductor Strength 4/5  Distal Motor Normal  Distal Sensory Normal light touch sensation  Distal Pulses Normal     Imaging Studies: I have reviewed AP, lateral,sunrise, and flexed PA weight bearing knee X-rays (4 views) of the left knee that show moderate to severe tricompartmental degenerative changes worse in the medial and patellofemoral joints with bone-on-bone articulation, osteophyte formation in all 3 compartments with sclerosis and cystic changes. Kellgren-Lawrence grade 4. AP, sunrise, and flexed PA of the right knee also show status post medial compartment partial knee arthroplasty with some mild degenerative changes in the lateral compartment and moderate changes in the patellofemoral compartment with joint space narrowing osteophyte formation and sclerosis. No PERI hardware or osteolysis noted or fractures..   Assessment:  ICD-10-CM  1. Primary osteoarthritis of left knee M17.12  Left knee osteoarthritis  Plan: Mija is a 73 year old female presents with left knee bone on bone arthritis with tricompartmental changes. Patient has had years of increasing left knee pain that has interfered with her quality of life and activities day living. No relief with conservative treatment. Pain is severe and debilitating and interfering with her quality of life and activities of daily living. Risks, benefits, complications of a left total knee arthroplasty have been discussed with the patient and patient has agreed and consented the procedure with Dr. Audelia Acton on 07/16/2023  The hospitalization and post-operative care and rehabilitation were also discussed. The use of perioperative antibiotics and DVT prophylaxis were discussed.  The risk, benefits and alternatives to a surgical intervention were discussed at length with the patient. The patient was also advised of risks related to the medical comorbidities and elevated body mass index (BMI). A lengthy discussion took place to review the most common complications including but not limited to: stiffness, loss of function, complex regional pain syndrome, deep vein  thrombosis, pulmonary embolus, heart attack, stroke, infection, wound breakdown, numbness, intraoperative fracture, damage to nerves, tendon,muscles, arteries or other blood vessels, death and other possible complications from anesthesia. The patient was told that we will take steps to minimize these risks by using sterile technique, antibiotics and DVT prophylaxis when appropriate and follow the patient postoperatively in the office setting to monitor progress. The possibility of recurrent pain, no improvement in pain and actual worsening of pain were also discussed with the patient.   The patient received clearance for surgery. All questions answered patient agrees with above plan to proceed with left total knee arthroplasty.

## 2023-07-16 NOTE — Plan of Care (Signed)
 Patient  verbalizes understanding of plan of care/discharge

## 2023-07-16 NOTE — Plan of Care (Signed)
?  Problem: Activity: Goal: Ability to avoid complications of mobility impairment will improve Outcome: Progressing Goal: Range of joint motion will improve Outcome: Progressing   Problem: Pain Management: Goal: Pain level will decrease with appropriate interventions Outcome: Progressing   Problem: Skin Integrity: Goal: Will show signs of wound healing Outcome: Progressing   

## 2023-07-16 NOTE — Progress Notes (Signed)
 Bladder scan performed at 1552 for :  I&O cath'd for also at same time incont of urine. Tolerated procedure without event. Able to move bilateral lower ext side to side, pulses intact +2 bilaterally. Patient is awake/alert x4. Dr. Audelia Acton spoke to patient at bedside in pacu.

## 2023-07-16 NOTE — Interval H&P Note (Signed)
 Patient history and physical updated. Consent reviewed including risks, benefits, and alternatives to surgery. Patient agrees with above plan to proceed with left total knee arthroplasty.

## 2023-07-17 ENCOUNTER — Encounter: Payer: Self-pay | Admitting: Orthopedic Surgery

## 2023-07-17 DIAGNOSIS — M1712 Unilateral primary osteoarthritis, left knee: Secondary | ICD-10-CM | POA: Diagnosis not present

## 2023-07-17 LAB — BASIC METABOLIC PANEL WITH GFR
Anion gap: 7 (ref 5–15)
BUN: 15 mg/dL (ref 8–23)
CO2: 23 mmol/L (ref 22–32)
Calcium: 9.1 mg/dL (ref 8.9–10.3)
Chloride: 103 mmol/L (ref 98–111)
Creatinine, Ser: 0.83 mg/dL (ref 0.44–1.00)
GFR, Estimated: >60 mL/min (ref >60)
Glucose, Bld: 148 mg/dL — ABNORMAL HIGH (ref 70–99)
Potassium: 3.9 mmol/L (ref 3.5–5.1)
Sodium: 133 mmol/L — ABNORMAL LOW (ref 135–145)

## 2023-07-17 LAB — CBC
HCT: 38.1 % (ref 36.0–46.0)
Hemoglobin: 13.2 g/dL (ref 12.0–15.0)
MCH: 29 pg (ref 26.0–34.0)
MCHC: 34.6 g/dL (ref 30.0–36.0)
MCV: 83.7 fL (ref 80.0–100.0)
Platelets: 198 10*3/uL (ref 150–400)
RBC: 4.55 MIL/uL (ref 3.87–5.11)
RDW: 12.9 % (ref 11.5–15.5)
WBC: 14.9 10*3/uL — ABNORMAL HIGH (ref 4.0–10.5)
nRBC: 0 % (ref 0.0–0.2)

## 2023-07-17 MED ORDER — AMLODIPINE BESYLATE 5 MG PO TABS
ORAL_TABLET | ORAL | Status: AC
Start: 2023-07-17 — End: ?
  Filled 2023-07-17: qty 1

## 2023-07-17 MED ORDER — OXYCODONE HCL 5 MG PO TABS: 2.5000 mg | ORAL_TABLET | Freq: Three times a day (TID) | ORAL | 0 refills | Status: DC | PRN

## 2023-07-17 MED ORDER — ACETAMINOPHEN 500 MG PO TABS: 1000.0000 mg | ORAL_TABLET | Freq: Three times a day (TID) | ORAL | 0 refills | Status: DC

## 2023-07-17 MED ORDER — CELECOXIB 200 MG PO CAPS: 200.0000 mg | ORAL_CAPSULE | Freq: Two times a day (BID) | ORAL | 0 refills | Status: AC

## 2023-07-17 MED ORDER — DOCUSATE SODIUM 100 MG PO CAPS: 100.0000 mg | ORAL_CAPSULE | Freq: Two times a day (BID) | ORAL | 0 refills | Status: DC

## 2023-07-17 MED ORDER — TRAMADOL HCL 50 MG PO TABS
50.0000 mg | ORAL_TABLET | Freq: Four times a day (QID) | ORAL | 0 refills | Status: DC | PRN
Start: 2023-07-17 — End: 2023-12-14

## 2023-07-17 MED ORDER — ENOXAPARIN SODIUM 40 MG/0.4ML IJ SOSY: 40.0000 mg | PREFILLED_SYRINGE | INTRAMUSCULAR | 0 refills | Status: DC

## 2023-07-17 MED ORDER — ONDANSETRON HCL 4 MG PO TABS: 4.0000 mg | ORAL_TABLET | Freq: Four times a day (QID) | ORAL | 0 refills | Status: DC | PRN

## 2023-07-17 NOTE — Evaluation (Signed)
 Physical Therapy Evaluation Patient Details Name: LAKEITHIA RASOR MRN: [id number] DOB: 1951-02-23 Today's Date: 07/17/2023  History of Present Illness  73 y/o female s/p L TKA on 07/16/23  Clinical Impression  Patient admitted following above procedure. PTA, patient lives alone and was independent with no AD. Son in law will be assisting at discharge. Patient functioning at supervision level with RW. Ambulatory 300' with RW with cues for heel strike to emphasize L knee extension. Reviewed HEP, LE positioning, and importance of ROM, patient verbalized understanding. Patient will benefit from skilled PT services during acute stay to address listed deficits. Patient will benefit from ongoing therapy at discharge to maximize functional independence and safety.         If plan is discharge home, recommend the following: A little help with walking and/or transfers;A little help with bathing/dressing/bathroom;Assist for transportation;Help with stairs or ramp for entrance   Can travel by private vehicle        Equipment Recommendations None recommended by PT  Recommendations for Other Services       Functional Status Assessment Patient has had a recent decline in their functional status and demonstrates the ability to make significant improvements in function in a reasonable and predictable amount of time.     Precautions / Restrictions Precautions Precautions: Fall      Mobility  Bed Mobility               General bed mobility comments: OOB on arrival    Transfers Overall transfer level: Needs assistance Equipment used: Rolling Zakiah Gauthreaux (2 wheels) Transfers: Sit to/from Stand Sit to Stand: Supervision                Ambulation/Gait Ambulation/Gait assistance: Supervision Gait Distance (Feet): 300 Feet Assistive device: Rolling Jeanie Mccard (2 wheels) Gait Pattern/deviations: Step-through pattern, Decreased stride length Gait velocity: decreased     General Gait  Details: cues for heel strike on L to emphasize knee extensino  Stairs            Wheelchair Mobility     Tilt Bed    Modified Rankin (Stroke Patients Only)       Balance                                             Pertinent Vitals/Pain Pain Assessment Pain Assessment: Faces Faces Pain Scale: Hurts little more Pain Location: L knee Pain Descriptors / Indicators: Grimacing Pain Intervention(s): Monitored during session, Limited activity within patient's tolerance, Repositioned    Home Living Family/patient expects to be discharged to:: Private residence Living Arrangements: Alone Available Help at Discharge: Family Type of Home: House Home Access: Ramped entrance       Home Layout: One level Home Equipment: Agricultural consultant (2 wheels);Shower seat - built in;Toilet riser;Grab bars - tub/shower      Prior Function Prior Level of Function : Independent/Modified Independent;Driving                     Extremity/Trunk Assessment   Upper Extremity Assessment Upper Extremity Assessment: Overall WFL for tasks assessed    Lower Extremity Assessment Lower Extremity Assessment: Generalized weakness;LLE deficits/detail LLE Deficits / Details: deficits consistent with post op pain and weakness    Cervical / Trunk Assessment Cervical / Trunk Assessment: Normal  Communication   Communication Communication: No apparent difficulties    Cognition Arousal:  Alert Behavior During Therapy: WFL for tasks assessed/performed   PT - Cognitive impairments: No apparent impairments                         Following commands: Intact       Cueing       General Comments      Exercises Other Exercises Other Exercises: reviewed HEP handout with patient   Assessment/Plan    PT Assessment Patient needs continued PT services  PT Problem List Decreased strength;Decreased activity tolerance;Decreased range of motion;Decreased  balance;Decreased mobility;Decreased knowledge of precautions       PT Treatment Interventions DME instruction;Gait training;Functional mobility training;Therapeutic activities;Therapeutic exercise;Balance training;Neuromuscular re-education;Patient/family education    PT Goals (Current goals can be found in the Care Plan section)  Acute Rehab PT Goals Patient Stated Goal: to go home PT Goal Formulation: With patient Time For Goal Achievement: 07/31/23 Potential to Achieve Goals: Good    Frequency BID     Co-evaluation               AM-PAC PT "6 Clicks" Mobility  Outcome Measure Help needed turning from your back to your side while in a flat bed without using bedrails?: A Little Help needed moving from lying on your back to sitting on the side of a flat bed without using bedrails?: A Little Help needed moving to and from a bed to a chair (including a wheelchair)?: A Little Help needed standing up from a chair using your arms (e.g., wheelchair or bedside chair)?: A Little Help needed to walk in hospital room?: A Little Help needed climbing 3-5 steps with a railing? : A Little 6 Click Score: 18    End of Session   Activity Tolerance: Patient tolerated treatment well Patient left: in chair;with call bell/phone within reach Nurse Communication: Mobility status PT Visit Diagnosis: Unsteadiness on feet (R26.81);Muscle weakness (generalized) (M62.81)    Time: 8469-6295 PT Time Calculation (min) (ACUTE ONLY): 17 min   Charges:   PT Evaluation $PT Eval Low Complexity: 1 Low   PT General Charges $$ ACUTE PT VISIT: 1 Visit         Maylon Peppers, PT, DPT Physical Therapist - [facility]  [facility]   Jeremie Abdelaziz A Arzell Mcgeehan 07/17/2023, 9:45 AM

## 2023-07-17 NOTE — Progress Notes (Signed)
 DISCHARGE NOTE:   Pt dc with IV removed and dc instructions given. Pt has both TED hose on and in place. Pt has polar care and wheeled down to medical mall entrance by staff. Pt's husband provided transportation.

## 2023-07-17 NOTE — Plan of Care (Signed)
°  Problem: Education: Goal: Knowledge of General Education information will improve Description: Including pain rating scale, medication(s)/side effects and non-pharmacologic comfort measures Outcome: Progressing   Problem: Education: Goal: Knowledge of the prescribed therapeutic regimen will improve Outcome: Progressing   Problem: Clinical Measurements: Goal: Postoperative complications will be avoided or minimized Outcome: Progressing   Problem: Pain Management: Goal: Pain level will decrease with appropriate interventions Outcome: Progressing

## 2023-07-17 NOTE — Discharge Summary (Signed)
 Physician Discharge Summary  Patient ID: Kelsey Mendoza MRN: [id number] DOB/AGE: 1950/08/24 73 y.o.  Admit date: 07/16/2023 Discharge date: 07/17/2023  Admission Diagnoses:  Primary osteoarthritis of left knee [M17.12] Left knee pain, unspecified chronicity [M25.562] S/P TKR (total knee replacement), left [Z96.652]   Discharge Diagnoses: Patient Active Problem List   Diagnosis Date Noted   S/P TKR (total knee replacement), left 07/16/2023   Primary osteoarthritis of left knee 06/11/2023   Elevated TSH 04/18/2022   Benign hypertension with CKD (chronic kidney disease) stage III (HCC) 01/17/2022   Hyperlipidemia associated with type 2 diabetes mellitus (HCC) 01/17/2022   OSA on CPAP 06/13/2021   Posterior capsular opacification, right 03/25/2020   Postoperative follow-up 03/18/2020   Left posterior capsular opacification 02/12/2020   Left epiretinal membrane 11/10/2019   Right epiretinal membrane 11/10/2019   Cystoid macular edema of left eye 11/10/2019   Glaucoma suspect, bilateral 11/10/2019   Cancer (HCC) 10/04/2019   Closed Colles' fracture 07/03/2017   Distal radius fracture, right 06/05/2017   Contusion of left knee 11/27/2016   Contusion of right knee 11/27/2016   Depression 03/26/2014   Depressed affect 09/25/2013   Difficulty sleeping 09/25/2013   Type 2 diabetes mellitus with other specified complication (HCC) 09/03/2013   Diverticular disease 07/17/2012   Diverticulosis of colon 07/17/2012   Arthritis 07/14/2012   Bladder cancer (HCC) 07/14/2012    Past Medical History:  Diagnosis Date   Bladder cancer (HCC) 2000   resection x 3 and BCG therapy   CAD (coronary artery disease)    Chronic kidney disease, stage 3 (HCC)    Diastolic dysfunction    Diverticulosis    Fatigue    History of bilateral cataract extraction    HLD (hyperlipidemia)    HTN (hypertension)    Insomnia    Long-term current use of immunosuppressive biologic agent    a.) deucravacitinib    Obstructive sleep apnea with dependence on continuous positive airway pressure (CPAP)    Pericardial effusion    Primary osteoarthritis of left knee    Psoriasis of scalp    a.) Tx'd with deucravacitinib   Type 2 diabetes mellitus (HCC)      Transfusion: none   Consultants (if any):   Discharged Condition: Improved  Hospital Course: Kelsey Mendoza is an 73 y.o. female who was admitted 07/16/2023 with a diagnosis of S/P TKR (total knee replacement), left and went to the operating room on 07/16/2023 and underwent the above named procedures.    Surgeries: Procedure(s): ARTHROPLASTY, KNEE, TOTAL on 07/16/2023 Patient tolerated the surgery well. Taken to PACU where she was stabilized and then transferred to the orthopedic floor.  Started on Lovenox 30 mg q 12 hrs. TEDs and SCDs applied bilaterally. Heels elevated on bed. No evidence of DVT. Negative Homan. Physical therapy started on day #1 for gait training and transfer. OT started day #1 for ADL and assisted devices.  Patient's IV was d/c on day #1. Patient was able to safely and independently complete all PT goals. PT recommending discharge to home.    On post op day #1 patient was stable and ready for discharge to home with HHPT.  Implants:  Persona Size 6 CR PPS   Tibia: Persona Size E osseoti  Poly: 13mm MC  Patella: 32x9.22mm symmetric porous   She was given perioperative antibiotics:  Anti-infectives (From admission, onward)    Start     Dose/Rate Route Frequency Ordered Stop   07/16/23 2000  ceFAZolin (ANCEF) IVPB 2g/100  mL premix        2 g 200 mL/hr over 30 Minutes Intravenous Every 6 hours 07/16/23 1721 07/17/23 0139   07/16/23 1045  ceFAZolin (ANCEF) IVPB 2g/100 mL premix        2 g 200 mL/hr over 30 Minutes Intravenous On call to O.R. 07/16/23 1042 07/16/23 1406     .  She was given sequential compression devices, early ambulation, and Lovenox TEDs for DVT prophylaxis.  She benefited maximally from the hospital  stay and there were no complications.    Recent vital signs:  Vitals:   07/17/23 0327 07/17/23 0747  BP: 135/80 130/85  Pulse: 70 60  Resp: 16 15  Temp: 98 F (36.7 C) 97.6 F (36.4 C)  SpO2: 96%     Recent laboratory studies:  Lab Results  Component Value Date   HGB 13.2 07/17/2023   HGB 15.3 (H) 07/16/2023   HGB 14.9 06/04/2023   Lab Results  Component Value Date   WBC 14.9 (H) 07/17/2023   PLT 198 07/17/2023   Lab Results  Component Value Date   INR 0.89 06/05/2017   Lab Results  Component Value Date   NA 133 (L) 07/17/2023   K 3.9 07/17/2023   CL 103 07/17/2023   CO2 23 07/17/2023   BUN 15 07/17/2023   CREATININE 0.83 07/17/2023   GLUCOSE 148 (H) 07/17/2023    Discharge Medications:   Allergies as of 07/17/2023       Reactions   Trazodone Other (See Comments)   Caused violent, disturbing nightmares        Medication List     STOP taking these medications    Sotyktu 6 MG Tabs Generic drug: Deucravacitinib       TAKE these medications    acetaminophen 500 MG tablet Commonly known as: TYLENOL Take 2 tablets (1,000 mg total) by mouth every 8 (eight) hours.   amLODipine 5 MG tablet Commonly known as: NORVASC Take 5 mg by mouth daily.   amoxicillin 500 MG tablet Commonly known as: AMOXIL Take 2,000 mg by mouth once.   B-12 PO Take 5,000 mcg by mouth daily at 6 (six) AM.   celecoxib 200 MG capsule Commonly known as: CeleBREX Take 1 capsule (200 mg total) by mouth 2 (two) times daily for 10 days.   diphenhydramine-acetaminophen 25-500 MG Tabs tablet Commonly known as: TYLENOL PM Take 1 tablet by mouth at bedtime as needed.   docusate sodium 100 MG capsule Commonly known as: COLACE Take 1 capsule (100 mg total) by mouth 2 (two) times daily.   enoxaparin 40 MG/0.4ML injection Commonly known as: LOVENOX Inject 0.4 mLs (40 mg total) into the skin daily for 14 days.   furosemide 40 MG tablet Commonly known as: LASIX Take 40 mg by  mouth daily as needed for fluid.   Mounjaro 15 MG/0.5ML Pen Generic drug: tirzepatide Inject 15 mg into the skin once a week. What changed: additional instructions   ondansetron 4 MG tablet Commonly known as: ZOFRAN Take 1 tablet (4 mg total) by mouth every 6 (six) hours as needed for nausea.   oxyCODONE 5 MG immediate release tablet Commonly known as: Roxicodone Take 0.5-1 tablets (2.5-5 mg total) by mouth every 8 (eight) hours as needed.   potassium chloride 10 MEQ tablet Commonly known as: KLOR-CON Take 1 tablet (10 mEq total) by mouth daily.   rosuvastatin 5 MG tablet Commonly known as: CRESTOR TAKE 1 TABLET BY MOUTH EVERY DAY   traMADol  50 MG tablet Commonly known as: ULTRAM Take 1 tablet (50 mg total) by mouth every 6 (six) hours as needed for moderate pain (pain score 4-6).   Vitamin D3 50 MCG (2000 UT) capsule Take by mouth daily.        Diagnostic Studies: DG Knee Left Port Result Date: 07/16/2023 CLINICAL DATA:  Elective surgery.  Postop. EXAM: PORTABLE LEFT KNEE - 1-2 VIEW COMPARISON:  None Available. FINDINGS: Left knee arthroplasty in expected alignment. No periprosthetic lucency or fracture. Recent postsurgical change includes air and edema in the soft tissues and joint space. IMPRESSION: Left knee arthroplasty without immediate postoperative complication. Electronically Signed   By: Narda Rutherford M.D.   On: 07/16/2023 16:12    Disposition:      Follow-up Information     Evon Slack, PA-C Follow up in 2 week(s).   Specialties: Orthopedic Surgery, Emergency Medicine Contact information: [address] [place] [address] [phone]                  Signed: Evon Slack 07/17/2023, 8:05 AM

## 2023-07-17 NOTE — Progress Notes (Signed)
   Subjective: 1 Day Post-Op Procedure(s) (LRB): ARTHROPLASTY, KNEE, TOTAL (Left) Patient reports pain as mild.   Patient is well, and has had no acute complaints or problems Denies any CP, SOB, ABD pain. We will continue therapy today.  Plan is to go Home after hospital stay.  Objective: Vital signs in last 24 hours: Temp:  [97.3 F (36.3 C)-98.8 F (37.1 C)] 97.6 F (36.4 C) (04/08 0747) Pulse Rate:  [60-94] 60 (04/08 0747) Resp:  [14-18] 15 (04/08 0747) BP: (104-158)/(64-88) 130/85 (04/08 0747) SpO2:  [93 %-100 %] 96 % (04/08 0327) Weight:  [74.8 kg] 74.8 kg (04/07 1050)  Intake/Output from previous day: 04/07 0701 - 04/08 0700 In: 1705.1 [P.O.:325; I.V.:1080.1; IV Piggyback:300] Out: 350 [Urine:300; Blood:50] Intake/Output this shift: No intake/output data recorded.  Recent Labs    07/16/23 1055 07/17/23 0523  HGB 15.3* 13.2   Recent Labs    07/16/23 1055 07/17/23 0523  WBC  --  14.9*  RBC  --  4.55  HCT 45.0 38.1  PLT  --  198   Recent Labs    07/16/23 1055 07/17/23 0523  NA 140 133*  K 4.1 3.9  CL 107 103  CO2  --  23  BUN 14 15  CREATININE 1.10* 0.83  GLUCOSE 93 148*  CALCIUM  --  9.1   No results for input(s): "LABPT", "INR" in the last 72 hours.  EXAM General - Patient is Alert, Appropriate, and Oriented Extremity - Neurovascular intact Sensation intact distally Intact pulses distally Dorsiflexion/Plantar flexion intact No cellulitis present Compartment soft Dressing - dressing C/D/I and no drainage Motor Function - intact, moving foot and toes well on exam.   Past Medical History:  Diagnosis Date   Bladder cancer (HCC) 2000   resection x 3 and BCG therapy   CAD (coronary artery disease)    Chronic kidney disease, stage 3 (HCC)    Diastolic dysfunction    Diverticulosis    Fatigue    History of bilateral cataract extraction    HLD (hyperlipidemia)    HTN (hypertension)    Insomnia    Long-term current use of immunosuppressive  biologic agent    a.) deucravacitinib   Obstructive sleep apnea with dependence on continuous positive airway pressure (CPAP)    Pericardial effusion    Primary osteoarthritis of left knee    Psoriasis of scalp    a.) Tx'd with deucravacitinib   Type 2 diabetes mellitus (HCC)     Assessment/Plan:   1 Day Post-Op Procedure(s) (LRB): ARTHROPLASTY, KNEE, TOTAL (Left) Principal Problem:   S/P TKR (total knee replacement), left  Estimated body mass index is 28.32 kg/m as calculated from the following:   Height as of this encounter: 5\' 4"  (1.626 m).   Weight as of this encounter: 74.8 kg. Advance diet Up with therapy Pain well controlled Labs and VSS CM to assist with discharge to home with HHPT today  DVT Prophylaxis - Lovenox, TED hose, and SCDs Weight-Bearing as tolerated to left leg   T. Cranston Neighbor, PA-C [facility] 07/17/2023, 7:59 AM

## 2023-07-21 DIAGNOSIS — Z471 Aftercare following joint replacement surgery: Secondary | ICD-10-CM | POA: Diagnosis not present

## 2023-07-21 DIAGNOSIS — F32A Depression, unspecified: Secondary | ICD-10-CM | POA: Diagnosis not present

## 2023-07-21 DIAGNOSIS — Z96653 Presence of artificial knee joint, bilateral: Secondary | ICD-10-CM | POA: Diagnosis not present

## 2023-07-21 DIAGNOSIS — E1169 Type 2 diabetes mellitus with other specified complication: Secondary | ICD-10-CM | POA: Diagnosis not present

## 2023-07-21 DIAGNOSIS — Z8551 Personal history of malignant neoplasm of bladder: Secondary | ICD-10-CM | POA: Diagnosis not present

## 2023-07-21 DIAGNOSIS — E1122 Type 2 diabetes mellitus with diabetic chronic kidney disease: Secondary | ICD-10-CM | POA: Diagnosis not present

## 2023-07-21 DIAGNOSIS — E669 Obesity, unspecified: Secondary | ICD-10-CM | POA: Diagnosis not present

## 2023-07-21 DIAGNOSIS — E785 Hyperlipidemia, unspecified: Secondary | ICD-10-CM | POA: Diagnosis not present

## 2023-07-21 DIAGNOSIS — Z7901 Long term (current) use of anticoagulants: Secondary | ICD-10-CM | POA: Diagnosis not present

## 2023-07-21 DIAGNOSIS — Z7985 Long-term (current) use of injectable non-insulin antidiabetic drugs: Secondary | ICD-10-CM | POA: Diagnosis not present

## 2023-07-21 DIAGNOSIS — I129 Hypertensive chronic kidney disease with stage 1 through stage 4 chronic kidney disease, or unspecified chronic kidney disease: Secondary | ICD-10-CM | POA: Diagnosis not present

## 2023-07-21 DIAGNOSIS — G4733 Obstructive sleep apnea (adult) (pediatric): Secondary | ICD-10-CM | POA: Diagnosis not present

## 2023-07-21 DIAGNOSIS — Z6827 Body mass index (BMI) 27.0-27.9, adult: Secondary | ICD-10-CM | POA: Diagnosis not present

## 2023-07-21 DIAGNOSIS — G4700 Insomnia, unspecified: Secondary | ICD-10-CM | POA: Diagnosis not present

## 2023-07-21 DIAGNOSIS — I2510 Atherosclerotic heart disease of native coronary artery without angina pectoris: Secondary | ICD-10-CM | POA: Diagnosis not present

## 2023-07-21 DIAGNOSIS — N183 Chronic kidney disease, stage 3 unspecified: Secondary | ICD-10-CM | POA: Diagnosis not present

## 2023-07-31 DIAGNOSIS — M25562 Pain in left knee: Secondary | ICD-10-CM | POA: Diagnosis not present

## 2023-07-31 DIAGNOSIS — M25662 Stiffness of left knee, not elsewhere classified: Secondary | ICD-10-CM | POA: Diagnosis not present

## 2023-07-31 DIAGNOSIS — Z96652 Presence of left artificial knee joint: Secondary | ICD-10-CM | POA: Diagnosis not present

## 2023-07-31 DIAGNOSIS — M6281 Muscle weakness (generalized): Secondary | ICD-10-CM | POA: Diagnosis not present

## 2023-08-02 DIAGNOSIS — Z96652 Presence of left artificial knee joint: Secondary | ICD-10-CM | POA: Diagnosis not present

## 2023-08-02 DIAGNOSIS — M25562 Pain in left knee: Secondary | ICD-10-CM | POA: Diagnosis not present

## 2023-08-06 DIAGNOSIS — Z96652 Presence of left artificial knee joint: Secondary | ICD-10-CM | POA: Diagnosis not present

## 2023-08-06 DIAGNOSIS — M25562 Pain in left knee: Secondary | ICD-10-CM | POA: Diagnosis not present

## 2023-08-10 DIAGNOSIS — Z96652 Presence of left artificial knee joint: Secondary | ICD-10-CM | POA: Diagnosis not present

## 2023-08-10 DIAGNOSIS — M25562 Pain in left knee: Secondary | ICD-10-CM | POA: Diagnosis not present

## 2023-08-14 DIAGNOSIS — M25562 Pain in left knee: Secondary | ICD-10-CM | POA: Diagnosis not present

## 2023-08-14 DIAGNOSIS — Z96652 Presence of left artificial knee joint: Secondary | ICD-10-CM | POA: Diagnosis not present

## 2023-08-16 DIAGNOSIS — M25562 Pain in left knee: Secondary | ICD-10-CM | POA: Diagnosis not present

## 2023-08-16 DIAGNOSIS — Z96652 Presence of left artificial knee joint: Secondary | ICD-10-CM | POA: Diagnosis not present

## 2023-08-16 DIAGNOSIS — E663 Overweight: Secondary | ICD-10-CM | POA: Diagnosis not present

## 2023-08-16 DIAGNOSIS — G4733 Obstructive sleep apnea (adult) (pediatric): Secondary | ICD-10-CM | POA: Diagnosis not present

## 2023-08-16 DIAGNOSIS — I959 Hypotension, unspecified: Secondary | ICD-10-CM | POA: Diagnosis not present

## 2023-08-16 DIAGNOSIS — I259 Chronic ischemic heart disease, unspecified: Secondary | ICD-10-CM | POA: Diagnosis not present

## 2023-08-16 DIAGNOSIS — E782 Mixed hyperlipidemia: Secondary | ICD-10-CM | POA: Diagnosis not present

## 2023-08-16 DIAGNOSIS — E119 Type 2 diabetes mellitus without complications: Secondary | ICD-10-CM | POA: Diagnosis not present

## 2023-08-16 DIAGNOSIS — I3139 Other pericardial effusion (noninflammatory): Secondary | ICD-10-CM | POA: Diagnosis not present

## 2023-08-16 DIAGNOSIS — I2510 Atherosclerotic heart disease of native coronary artery without angina pectoris: Secondary | ICD-10-CM | POA: Diagnosis not present

## 2023-08-17 DIAGNOSIS — G4733 Obstructive sleep apnea (adult) (pediatric): Secondary | ICD-10-CM | POA: Diagnosis not present

## 2023-08-20 DIAGNOSIS — Z96652 Presence of left artificial knee joint: Secondary | ICD-10-CM | POA: Diagnosis not present

## 2023-08-20 DIAGNOSIS — M25562 Pain in left knee: Secondary | ICD-10-CM | POA: Diagnosis not present

## 2023-08-22 DIAGNOSIS — M25562 Pain in left knee: Secondary | ICD-10-CM | POA: Diagnosis not present

## 2023-08-22 DIAGNOSIS — Z96652 Presence of left artificial knee joint: Secondary | ICD-10-CM | POA: Diagnosis not present

## 2023-08-28 DIAGNOSIS — M25562 Pain in left knee: Secondary | ICD-10-CM | POA: Diagnosis not present

## 2023-08-28 DIAGNOSIS — Z96652 Presence of left artificial knee joint: Secondary | ICD-10-CM | POA: Diagnosis not present

## 2023-09-04 ENCOUNTER — Ambulatory Visit: Payer: Self-pay

## 2023-09-04 NOTE — Telephone Encounter (Signed)
 Spoke with patient, scheduled appointment with Regina,NP

## 2023-09-04 NOTE — Telephone Encounter (Signed)
  Chief Complaint: 4 tick bites over the past 5 weeks Symptoms: fatigue Frequency: 5 weeks Pertinent Negatives: Patient denies fever Disposition: [] ED /[] Urgent Care (no appt availability in office) / [] Appointment(In office/virtual)/ []  [place] [facility]/ [] Home Care/ [] Refused Recommended Disposition /[] [place] [facility]/ []  Follow-up with PCP Additional Notes: PT has had 4 tick bites over the past 5 weeks. Pt had last tetanus over 5 years ago. Pt needs to be seen within 24 hours. Pt does not want to go to UC. Pt wants to be worked  in. Please return pt's call with appt.    Copied from CRM [id number]. Topic: Clinical - Red Word Triage >> Sep 04, 2023  4:18 PM Yolanda T wrote: [facility] that prompted transfer to Nurse Triage: patient called stated she has had several tick bites and is now feeling very fatigue Reason for Disposition  [1] 2 to 14 days following tick bite AND [2] fever AND [3] no rash or headache  Answer Assessment - Initial Assessment Questions 1. ATTACHED:  "Is the tick still on the skin?"  (e.g., yes, no, unsure)     no 2. ONSET - TICK STILL ATTACHED:  "How long do you think the tick has been on your skin?" (e.g., hours, days, unsure)  Note:  Is there a recent activity (camping, hiking) where the caller may have been exposed?     no 3. ONSET - TICK NOT STILL ATTACHED: "If the tick has been removed, how long do you think the tick was attached before you removed it?" (e.g., 5 hours, 2 days). "When was this?"     no 4. LOCATION: "Where is the tick bite located?" (e.g., arm, leg)     legs 5. TYPE of TICK: "Is it a wood tick or a deer tick?" (e.g., deer tick, wood tick; unsure)     Unsure 6. SIZE of TICK: "How big is the tick?" (e.g., size of poppy seed, apple seed, watermelon seed; unsure) Note: Deer ticks can be the size of a poppy seed (nymph) or an apple seed (adult).       Head of a pin 7. ENGORGED: "Did the tick look flat or engorged (full, swollen)?" (e.g.,  flat, engorged; unsure)     Not engorged 8. OTHER SYMPTOMS: "Do you have any other symptoms?" (e.g., fever, rash, redness at bite area, red ring around bite)     Fatigue  Protocols used: Tick Bite-A-AH

## 2023-09-05 ENCOUNTER — Encounter: Payer: Self-pay | Admitting: Internal Medicine

## 2023-09-05 ENCOUNTER — Ambulatory Visit (INDEPENDENT_AMBULATORY_CARE_PROVIDER_SITE_OTHER): Admitting: Internal Medicine

## 2023-09-05 VITALS — BP 124/74 | Ht 64.0 in | Wt 161.4 lb

## 2023-09-05 DIAGNOSIS — F5101 Primary insomnia: Secondary | ICD-10-CM | POA: Diagnosis not present

## 2023-09-05 DIAGNOSIS — R5383 Other fatigue: Secondary | ICD-10-CM

## 2023-09-05 DIAGNOSIS — E538 Deficiency of other specified B group vitamins: Secondary | ICD-10-CM | POA: Diagnosis not present

## 2023-09-05 DIAGNOSIS — E559 Vitamin D deficiency, unspecified: Secondary | ICD-10-CM

## 2023-09-05 DIAGNOSIS — W57XXXA Bitten or stung by nonvenomous insect and other nonvenomous arthropods, initial encounter: Secondary | ICD-10-CM

## 2023-09-05 NOTE — Patient Instructions (Signed)
Tick Bite Information, Adult  Ticks are insects that can bite. Most ticks live in bushes and grassy areas. They climb onto people and animals that go by. Then they bite. Some ticks carry germs that can make you sick. How can I prevent tick bites? Take these steps: Before you go outside: Wear long sleeves and long pants. Wear light-colored clothes. Tuck your pant legs into your socks. Use an insect repellent that has 20% or higher of the ingredients DEET, picaridin, or IR3535. Follow the instructions on the label. Put it on: Bare skin. Avoid your eyes and mouth areas. The tops of your boots. Your pant legs. The ends of your sleeves. If you use an insect repellent that has the ingredient permethrin, follow the instructions on the label. Do not put permethrin on the skin. Put it on: Clothing. Shoes. Outdoor gear. Tents. When you are outside Avoid walking through long grass. Stay in the middle of the trail. Do not touch the bushes. Check for ticks on your clothes, hair, and skin often while you are outside. Check again before you go inside. When you go indoors Check your clothes for ticks. Dry your clothes in a dryer on high heat for 10 minutes or more. If clothes are damp, more time may be needed. Wash your clothes right away if they need to be washed. Use hot water. Check your pets and outdoor gear. Shower right away. Check your body for ticks. Do a full body check using a mirror. Check your clothes, skin, head, neck, armpits, waist, groin, and joint areas. What is the right way to remove a tick? Remove the tick from your skin as soon as possible. Do not remove the tick with your bare fingers. Do not try to remove a tick with heat, alcohol, petroleum jelly, or fingernail polish. To remove a tick that is crawling on your skin: Go outside and brush the tick off. Use tape or a lint roller. To remove a tick that is biting: Wash your hands. If you have gloves, put them on. Use tweezers,  curved forceps, or a tick-removal tool to grasp the tick. Grasp the tick as close to your skin and as close to the tick's head as possible. Gently pull up until the tick lets go. Try to keep the tick's head attached to its body. Do not twist or jerk the tick. Do not squeeze or crush the tick. What should I do after taking out a tick? Clean the bite area and your hands with soap and water, rubbing alcohol, or an iodine wash. If you have an antiseptic cream or ointment, put a small amount on the bite area. Wash and disinfect any instruments that you used to remove the tick. How should I get rid of a live tick? To get rid of a live tick, use one of these methods: Place the tick in rubbing alcohol. Place it in a bag or container you can close tightly. Throw it away. Wrap it tightly in tape. Throw it away. Flush it down the toilet. Where to find more information Centers for Disease Control and Prevention: cdc.gov/ticks U.[place]. Environmental Protection Agency: epa.gov/insect-repellents Contact a doctor if: You have a fever or chills. You have a red rash that makes a circle (bull's-eye rash) in the bite area. You have redness and swelling where the tick bit you. You have a headache or stiff neck. You have pain in a muscle, joint, or bone. You are more tired than normal. You have trouble walking or   moving your legs. You have numbness in your legs. You have tender or swollen lymph glands. You have belly (abdominal) pain, vomiting, watery poop (diarrhea), or weight loss. Get help right away if: You cannot remove a tick. You cannot move (have paralysis) or feel weak. You are feeling worse or have new symptoms. You find a tick that is biting you and filled with blood, especially if you are in an area where diseases from ticks are common. Summary Ticks may carry germs that can make you sick. To prevent tick bites wear long sleeves, long pants, and light colors. Use insect repellent. Follow the  instructions on the label. If the tick is biting, remove it right away. Do not try to remove it with heat, alcohol, petroleum jelly, or fingernail polish. Contact a doctor if you have symptoms of a disease after being bitten by a tick. This information is not intended to replace advice given to you by your health care provider. Make sure you discuss any questions you have with your health care provider. Document Revised: 06/27/2021 Document Reviewed: 06/27/2021 Elsevier Patient Education  2024 [employer]  

## 2023-09-05 NOTE — Progress Notes (Signed)
 Subjective:    Patient ID: Kelsey Mendoza, female    DOB: [birth year], 73 y.o.   MRN: [id number]  HPI Discussed the use of AI scribe software for clinical note transcription with the patient, who gave verbal consent to proceed.    Kelsey Mendoza "Kelsey Mendoza" is a 73 year old female who presents with concerns about recent tick bites and associated symptoms.  She has experienced several tick bites over the last few weeks and is concerned about the potential for tick-borne illnesses, given her past history of Lyme disease and [facility] spotted fever approximately five to six years ago. None of the ticks were left on her for an extended period, and she did not develop a bull's eye rash with the recent bites, similar to her previous experience with Lyme disease.  She is experiencing extreme fatigue and frequent sneezing. No severe headaches, confusion, diarrhea, rashes, or new or worsening joint pain, although she notes that all her joints hurt due to her age. Her recent medical history includes knee surgery on July 16, 2023. She had her thyroid  function checked three months ago, which was normal, and her B12 levels were high in August 2024, for which she takes over-the-counter supplements. She also takes vitamin D3 supplements.  She experiences insomnia and reports that she was sad after her husband's death, but she does not currently consider herself depressed. She occasionally takes ambien  prescribed by Doctor K but uses it sparingly due to concerns about its effects.  [place], which she tends to daily.        Review of Systems   Past Medical History:  Diagnosis Date   Bladder cancer (HCC) 2000   resection x 3 and BCG therapy   CAD (coronary artery disease)    Chronic kidney disease, stage 3 (HCC)    Diastolic dysfunction    Diverticulosis    Fatigue    History of bilateral cataract extraction    HLD (hyperlipidemia)    HTN  (hypertension)    Insomnia    Long-term current use of immunosuppressive biologic agent    a.) deucravacitinib   Obstructive sleep apnea with dependence on continuous positive airway pressure (CPAP)    Pericardial effusion    Primary osteoarthritis of left knee    Psoriasis of scalp    a.) Tx'd with deucravacitinib   Type 2 diabetes mellitus (HCC)     Current Outpatient Medications  Medication Sig Dispense Refill   acetaminophen  (TYLENOL ) 500 MG tablet Take 2 tablets (1,000 mg total) by mouth every 8 (eight) hours. 30 tablet 0   amLODipine  (NORVASC ) 5 MG tablet Take 5 mg by mouth daily.     amoxicillin  (AMOXIL ) 500 MG tablet Take 2,000 mg by mouth once. (Patient not taking: Reported on 07/16/2023)     Cholecalciferol  (VITAMIN D3) 50 MCG (2000 UT) capsule Take by mouth daily.     Cyanocobalamin (B-12 PO) Take 5,000 mcg by mouth daily at 6 (six) AM.     diphenhydramine -acetaminophen  (TYLENOL  PM) 25-500 MG TABS tablet Take 1 tablet by mouth at bedtime as needed.     docusate sodium  (COLACE) 100 MG capsule Take 1 capsule (100 mg total) by mouth 2 (two) times daily. 10 capsule 0   enoxaparin  (LOVENOX ) 40 MG/0.4ML injection Inject 0.4 mLs (40 mg total) into the skin daily for 14 days. 5.6 mL 0   furosemide  (LASIX ) 40 MG tablet Take 40 mg by mouth  daily as needed for fluid.     MOUNJARO  15 MG/0.5ML Pen Inject 15 mg into the skin once a week. (Patient taking differently: Inject 15 mg into the skin once a week. thursday) 2 mL 5   ondansetron  (ZOFRAN ) 4 MG tablet Take 1 tablet (4 mg total) by mouth every 6 (six) hours as needed for nausea. 20 tablet 0   oxyCODONE  (ROXICODONE ) 5 MG immediate release tablet Take 0.5-1 tablets (2.5-5 mg total) by mouth every 8 (eight) hours as needed. 20 tablet 0   potassium chloride  (KLOR-CON ) 10 MEQ tablet Take 1 tablet (10 mEq total) by mouth daily. 10 tablet 0   rosuvastatin  (CRESTOR ) 5 MG tablet TAKE 1 TABLET BY MOUTH EVERY DAY 90 tablet 3   traMADol  (ULTRAM ) 50 MG  tablet Take 1 tablet (50 mg total) by mouth every 6 (six) hours as needed for moderate pain (pain score 4-6). 30 tablet 0   No current facility-administered medications for this visit.    Allergies  Allergen Reactions   Trazodone Other (See Comments)    Caused violent, disturbing nightmares     Family History  Problem Relation Age of Onset   Hypertension Mother    Hypertension Father    CAD Father    Breast cancer Neg Hx     Social History   Socioeconomic History   Marital status: Widowed    Spouse name: Not on file   Number of children: 3   Years of education: Not on file   Highest education level: Associate degree: occupational, Scientist, product/process development, or vocational program  Occupational History   Not on file  Tobacco Use   Smoking status: Former    Current packs/day: 0.00    Types: Cigarettes    Quit date: 03/01/1983    Years since quitting: 40.5   Smokeless tobacco: Never  Vaping Use   Vaping status: Never Used  Substance and Sexual Activity   Alcohol use: No   Drug use: No   Sexual activity: Not on file  Other Topics Concern   Not on file  Social History Narrative   LIVES ALONE   Social Drivers of Health   Financial Resource Strain: Low Risk  (06/07/2023)   Overall Financial Resource Strain (CARDIA)    Difficulty of Paying Living Expenses: Not hard at all  Food Insecurity: No Food Insecurity (07/16/2023)   Hunger Vital Sign    Worried About Running Out of Food in the Last Year: Never true    Ran Out of Food in the Last Year: Never true  Transportation Needs: No Transportation Needs (07/16/2023)   PRAPARE - Administrator, Civil Service (Medical): No    Lack of Transportation (Non-Medical): No  Physical Activity: Insufficiently Active (06/07/2023)   Exercise Vital Sign    Days of Exercise per Week: 2 days    Minutes of Exercise per Session: 20 min  Stress: No Stress Concern Present (06/07/2023)   [employer] of Occupational Health - Occupational  Stress Questionnaire    Feeling of Stress : Not at all  Social Connections: Socially Isolated (07/16/2023)   Social Connection and Isolation Panel [NHANES]    Frequency of Communication with Friends and Family: More than three times a week    Frequency of Social Gatherings with Friends and Family: Three times a week    Attends Religious Services: Never    Active Member of Clubs or Organizations: No    Attends Banker Meetings: Never    Marital Status:  Widowed  Intimate Partner Violence: Not At Risk (07/16/2023)   Humiliation, Afraid, Rape, and Kick questionnaire    Fear of Current or Ex-Partner: No    Emotionally Abused: No    Physically Abused: No    Sexually Abused: No     Constitutional: Pt reports fatigue. Denies fever, malaise, headache or abrupt weight changes.  HEENT: Pt reports sneezing. Denies eye pain, eye redness, ear pain, ringing in the ears, wax buildup, runny nose, nasal congestion, bloody nose, or sore throat. Respiratory: Denies difficulty breathing, shortness of breath, cough or sputum production.   Cardiovascular: Denies chest pain, chest tightness, palpitations or swelling in the hands or feet.  Gastrointestinal: Denies abdominal pain, bloating, constipation, diarrhea or blood in the stool.  GU: Denies urgency, frequency, pain with urination, burning sensation, blood in urine, odor or discharge. Musculoskeletal: Pt reports chronic generalized joint pain. Denies decrease in range of motion, difficulty with gait, muscle pain or joint swelling.  Skin: Pt reports multiple tick bites of legs. Denies redness, rashes, lesions or ulcercations.  Neurological: Pt reports insomnia. Denies dizziness, difficulty with memory, difficulty with speech or problems with balance and coordination.  Psych: Denies anxiety, depression, SI/HI.  No other specific complaints in a complete review of systems (except as listed in HPI above).      Objective:   Physical Exam BP  124/74 (BP Location: Left Arm, Patient Position: Sitting, Cuff Size: Normal)   Ht 5\' 4"  (1.626 m)   Wt 161 lb 6.4 oz (73.2 kg)   BMI 27.70 kg/m   Wt Readings from Last 3 Encounters:  07/16/23 165 lb (74.8 kg)  07/03/23 165 lb (74.8 kg)  06/11/23 166 lb (75.3 kg)    General: Appears her stated age, overweight, in NAD. Skin: Warm, dry and intact. No rashes noted. Cardiovascular: Normal rate and rhythm.  Pulmonary/Chest: Normal effort and positive vesicular breath sounds.  Musculoskeletal: Gait slow and steady without use of assistive device. Neurological: Alert and oriented. Coordination normal.   BMET    Component Value Date/Time   NA 133 (L) 07/17/2023 0523   K 3.9 07/17/2023 0523   CL 103 07/17/2023 0523   CO2 23 07/17/2023 0523   GLUCOSE 148 (H) 07/17/2023 0523   BUN 15 07/17/2023 0523   CREATININE 0.83 07/17/2023 0523   CREATININE 0.97 06/04/2023 0847   CALCIUM  9.1 07/17/2023 0523   GFRNONAA >60 07/17/2023 0523   GFRAA >60 06/07/2017 0300    Lipid Panel     Component Value Date/Time   CHOL 150 12/01/2022 0807   TRIG 203 (H) 12/01/2022 0807   HDL 48 (L) 12/01/2022 0807   CHOLHDL 3.1 12/01/2022 0807   LDLCALC 73 12/01/2022 0807    CBC    Component Value Date/Time   WBC 14.9 (H) 07/17/2023 0523   RBC 4.55 07/17/2023 0523   HGB 13.2 07/17/2023 0523   HCT 38.1 07/17/2023 0523   PLT 198 07/17/2023 0523   MCV 83.7 07/17/2023 0523   MCH 29.0 07/17/2023 0523   MCHC 34.6 07/17/2023 0523   RDW 12.9 07/17/2023 0523   LYMPHSABS 1,958 12/01/2022 0807   MONOABS 0.6 07/28/2021 1242   EOSABS 151 06/04/2023 0847   BASOSABS 59 06/04/2023 0847    Hgb A1C Lab Results  Component Value Date   HGBA1C 5.7 (H) 06/04/2023            Assessment & Plan:  Assessment and Plan    Fatigue, multiple tick bites Fatigue with recent tick bites. Normal thyroid   and CBC. High B12 possibly from supplements. Differential includes tick-borne illnesses. - Order [facility]  spotted fever IgG and IgM tests. - Order Borrelia burgdorferi antibody test. - Order B12 and vitamin D  levels. - Review blood work results and contact if abnormalities found.  Insomnia Chronic insomnia, and she reports her fatigue is different than what is typically attributed to her insomnia.  - Continue Ambien  sparingly.     Follow-up with your PCP as previously scheduled Helayne Lo, NP

## 2023-09-06 ENCOUNTER — Ambulatory Visit: Payer: Self-pay | Admitting: Internal Medicine

## 2023-09-07 DIAGNOSIS — Z96652 Presence of left artificial knee joint: Secondary | ICD-10-CM | POA: Diagnosis not present

## 2023-09-07 DIAGNOSIS — M25662 Stiffness of left knee, not elsewhere classified: Secondary | ICD-10-CM | POA: Diagnosis not present

## 2023-09-12 LAB — ROCKY MTN SPOTTED FVR ABS PNL(IGG+IGM)
RMSF IgG: NOT DETECTED
RMSF IgM: NOT DETECTED

## 2023-09-12 LAB — B. BURGDORFI ANTIBODIES: B burgdorferi Ab IgG+IgM: <0.9 {index}

## 2023-09-12 LAB — VITAMIN D 25 HYDROXY (VIT D DEFICIENCY, FRACTURES): Vit D, 25-Hydroxy: 71 ng/mL (ref 30–100)

## 2023-09-12 LAB — VITAMIN B12: Vitamin B-12: >2000 pg/mL — ABNORMAL HIGH (ref 200–1100)

## 2023-09-21 ENCOUNTER — Ambulatory Visit (INDEPENDENT_AMBULATORY_CARE_PROVIDER_SITE_OTHER): Payer: PRIVATE HEALTH INSURANCE

## 2023-09-21 DIAGNOSIS — Z0000 Encounter for general adult medical examination without abnormal findings: Secondary | ICD-10-CM

## 2023-09-21 DIAGNOSIS — Z1211 Encounter for screening for malignant neoplasm of colon: Secondary | ICD-10-CM

## 2023-09-21 NOTE — Patient Instructions (Addendum)
 Kelsey Mendoza , Thank you for taking time out of your busy schedule to complete your Annual Wellness Visit with me. I enjoyed our conversation and look forward to speaking with you again next year. I, as well as your care team,  appreciate your ongoing commitment to your health goals. Please review the following plan we discussed and let me know if I can assist you in the future.   Follow up Visits: Next Medicare AWV with our clinical staff:   09/26/24 @ 11:30 AM BY PHONE Have you seen your provider in the last 6 months (3 months if uncontrolled diabetes)? Yes   Clinician Recommendations:  Aim for 30 minutes of exercise or brisk walking, 6-8 glasses of water, and 5 servings of fruits and vegetables each day. TAKE CARE!      This is a list of the screening recommended for you and due dates:  Health Maintenance  Topic Date Due   COVID-19 Vaccine (1) Never done   Hepatitis C Screening  Never done   Colon Cancer Screening  Never done   Yearly kidney health urinalysis for diabetes  08/25/2023   Eye exam for diabetics  09/07/2023   Flu Shot  11/09/2023   Hemoglobin A1C  12/02/2023   Complete foot exam   12/12/2023   Mammogram  02/12/2024   Yearly kidney function blood test for diabetes  07/16/2024   Medicare Annual Wellness Visit  09/20/2024   DEXA scan (bone density measurement)  11/23/2027   DTaP/Tdap/Td vaccine (2 - Td or Tdap) 06/28/2031   Pneumococcal Vaccine for age over 57  Completed   Zoster (Shingles) Vaccine  Completed   HPV Vaccine  Aged Out   Meningitis B Vaccine  Aged Out    Advanced directives: (ACP Link)Information on Advanced Care Planning can be found at [employer] Advance Health Care Directives Advance Health Care Directives. http://guzman.[email or web address]/  Advance Care Planning is important because it:  [x]  Makes sure you receive the medical care that is consistent with your values, goals, and preferences  [x]  It provides guidance to your family and loved ones and  reduces their decisional burden about whether or not they are making the right decisions based on your wishes.  Follow the link provided in your after visit summary or read over the paperwork we have mailed to you to help you started getting your Advance Directives in place. If you need assistance in completing these, please reach out to us  so that we can help you!

## 2023-09-21 NOTE — Progress Notes (Signed)
 Subjective:   Kelsey Mendoza is a 73 y.o. who presents for a Medicare Wellness preventive visit.  As a reminder, Annual Wellness Visits don't include a physical exam, and some assessments may be limited, especially if this visit is performed virtually. We may recommend an in-person follow-up visit with your provider if needed.  Visit Complete: Virtual I connected with  [name] on 09/21/23 by a audio enabled telemedicine application and verified that I am speaking with the correct person using two identifiers.  Patient Location: Home  Provider Location: Office/Clinic  I discussed the limitations of evaluation and management by telemedicine. The patient expressed understanding and agreed to proceed.  Vital Signs: Because this visit was a virtual/telehealth visit, some criteria may be missing or patient reported. Any vitals not documented were not able to be obtained and vitals that have been documented are patient reported.  VideoDeclined- This patient declined Librarian, academic. Therefore the visit was completed with audio only.  Persons Participating in Visit: Patient.  AWV Questionnaire: No: Patient Medicare AWV questionnaire was not completed prior to this visit.  Cardiac Risk Factors include: advanced age (>44men, >34 women);diabetes mellitus;dyslipidemia;hypertension     Objective:    Today's Vitals   09/21/23 1400  PainSc: 3    There is no height or weight on file to calculate BMI.     09/21/2023    2:05 PM 07/16/2023   10:53 AM 07/03/2023    8:30 AM 09/15/2022    3:40 PM 07/28/2021   12:10 PM 06/27/2021   11:44 AM 05/27/2021    7:03 AM  Advanced Directives  Does Patient Have a Medical Advance Directive? No No Yes No No No Yes  Type of Furniture conservator/restorer;Living will Healthcare Power of [place];Living will    Healthcare Power of [place];Living will  Does patient want to make changes to medical advance  directive?       No - Patient declined  Copy of Healthcare Power of Attorney in Chart?  No - copy requested No - copy requested    No - copy requested  Would patient like information on creating a medical advance directive? No - Patient declined No - Patient declined  No - Patient declined       Current Medications (verified) Outpatient Encounter Medications as of 09/21/2023  Medication Sig   amLODipine  (NORVASC ) 5 MG tablet Take 5 mg by mouth daily.   amoxicillin  (AMOXIL ) 500 MG tablet Take 2,000 mg by mouth once.   Cholecalciferol  (VITAMIN D3) 50 MCG (2000 UT) capsule Take by mouth daily.   diphenhydramine -acetaminophen  (TYLENOL  PM) 25-500 MG TABS tablet Take 1 tablet by mouth at bedtime as needed.   furosemide  (LASIX ) 40 MG tablet Take 40 mg by mouth daily as needed for fluid.   MOUNJARO  15 MG/0.5ML Pen Inject 15 mg into the skin once a week.   rosuvastatin  (CRESTOR ) 5 MG tablet TAKE 1 TABLET BY MOUTH EVERY DAY   traMADol  (ULTRAM ) 50 MG tablet Take 1 tablet (50 mg total) by mouth every 6 (six) hours as needed for moderate pain (pain score 4-6).   Cyanocobalamin (B-12 PO) Take 5,000 mcg by mouth daily at 6 (six) AM.   No facility-administered encounter medications on file as of 09/21/2023.    Allergies (verified) Trazodone   History: Past Medical History:  Diagnosis Date   Bladder cancer (HCC) 2000   resection x 3 and BCG therapy   CAD (coronary artery disease)  Chronic kidney disease, stage 3 (HCC)    Diastolic dysfunction    Diverticulosis    Fatigue    History of bilateral cataract extraction    HLD (hyperlipidemia)    HTN (hypertension)    Insomnia    Long-term current use of immunosuppressive biologic agent    a.) deucravacitinib   Obstructive sleep apnea with dependence on continuous positive airway pressure (CPAP)    Pericardial effusion    Primary osteoarthritis of left knee    Psoriasis of scalp    a.) Tx'd with deucravacitinib   Type 2 diabetes mellitus (HCC)     Past Surgical History:  Procedure Laterality Date   BLADDER SURGERY     x 3 resection bladder cancer   BROW LIFT Bilateral 05/27/2021   Procedure: BLEPHAROPLASTY UPPER EYELID; W/EXCESS SKIN AND BROW PTOSIS REPAIR BILATERAL;  Surgeon: Zacarias Hermann, MD;  Location: [facility];  Service: Ophthalmology;  Laterality: Bilateral;   CARPAL TUNNEL RELEASE Right 06/06/2017   Procedure: CARPAL TUNNEL RELEASE;  Surgeon: Rande Bushy, MD;  Location: [facility];  Service: Orthopedics;  Laterality: Right;   CATARACT EXTRACTION W/ INTRAOCULAR LENS  IMPLANT, BILATERAL Bilateral 08/2019   COLONOSCOPY  07/16/2012   EYE SURGERY Left 11/26/2019   Dr. Seward Dao, Vit, Mem Elma Hacker   I & D EXTREMITY Right 06/06/2017   Procedure: IRRIGATION AND DEBRIDEMENT EXTREMITY--RIGHT ARM;  Surgeon: Rande Bushy, MD;  Location: [facility];  Service: Orthopedics;  Laterality: Right;   KNEE ARTHROSCOPY Left 10/06/2019   Procedure: ARTHROSCOPY KNEE,PARTIAL MEDIAL MENISECTOMY,PARTIAL CHONDROPLASTY;  Surgeon: Arlyne Lame, MD;  Location: [facility];  Service: Orthopedics;  Laterality: Left;   LUMBAR SPINE SURGERY     MEDIAL PARTIAL KNEE REPLACEMENT Right 12/29/2016   Makoplasty   OPEN REDUCTION INTERNAL FIXATION (ORIF) DISTAL RADIAL FRACTURE N/A 06/06/2017   Procedure: OPEN REDUCTION INTERNAL FIXATION (ORIF) DISTAL RADIAL FRACTURE;  Surgeon: Rande Bushy, MD;  Location: [facility];  Service: Orthopedics;  Laterality: N/A;   TOTAL KNEE ARTHROPLASTY Left 07/16/2023   Procedure: ARTHROPLASTY, KNEE, TOTAL;  Surgeon: Venus Ginsberg, MD;  Location: [facility];  Service: Orthopedics;  Laterality: Left;   VAGINAL HYSTERECTOMY     VITRECTOMY Bilateral    Family History  Problem Relation Age of Onset   Hypertension Mother    Hypertension Father    CAD Father    Breast cancer Neg Hx    Social History   Socioeconomic History   Marital status: Widowed    Spouse name: Not on file   Number of children: 3   Years of  education: Not on file   Highest education level: Associate degree: occupational, Scientist, product/process development, or vocational program  Occupational History   Not on file  Tobacco Use   Smoking status: Former    Current packs/day: 0.00    Types: Cigarettes    Quit date: 03/01/1983    Years since quitting: 40.5   Smokeless tobacco: Never  Vaping Use   Vaping status: Never Used  Substance and Sexual Activity   Alcohol use: No   Drug use: No   Sexual activity: Not on file  Other Topics Concern   Not on file  Social History Narrative   LIVES ALONE   Social Drivers of Health   Financial Resource Strain: Low Risk  (09/21/2023)   Overall Financial Resource Strain (CARDIA)    Difficulty of Paying Living Expenses: Not hard at all  Food Insecurity: No Food Insecurity (09/21/2023)   Hunger Vital Sign    Worried About  Running Out of Food in the Last Year: Never true    Ran Out of Food in the Last Year: Never true  Transportation Needs: No Transportation Needs (09/21/2023)   PRAPARE - Administrator, Civil Service (Medical): No    Lack of Transportation (Non-Medical): No  Physical Activity: Sufficiently Active (09/21/2023)   Exercise Vital Sign    Days of Exercise per Week: 6 days    Minutes of Exercise per Session: 50 min  Stress: No Stress Concern Present (09/21/2023)   [employer] of Occupational Health - Occupational Stress Questionnaire    Feeling of Stress: Not at all  Social Connections: Moderately Isolated (09/21/2023)   Social Connection and Isolation Panel    Frequency of Communication with Friends and Family: Twice a week    Frequency of Social Gatherings with Friends and Family: Three times a week    Attends Religious Services: Never    Active Member of Clubs or Organizations: Yes    Attends Banker Meetings: More than 4 times per year    Marital Status: Widowed    Tobacco Counseling Counseling given: Not Answered    Clinical Intake:  Pre-visit  preparation completed: Yes  Pain : 0-10 Pain Score: 3  Pain Type: Chronic pain Pain Location: Knee Pain Orientation: Left Pain Radiating Towards: just had surgery Pain Descriptors / Indicators: Aching, Discomfort Pain Onset: More than a month ago Pain Frequency: Constant     BMI - recorded: 27.6 Nutritional Status: BMI 25 -29 Overweight Nutritional Risks: None Diabetes: Yes CBG done?: No Did pt. bring in CBG monitor from home?: No  Lab Results  Component Value Date   HGBA1C 5.7 (H) 06/04/2023   HGBA1C 5.9 (H) 12/01/2022   HGBA1C 5.8 (H) 08/18/2022     How often do you need to have someone help you when you read instructions, pamphlets, or other written materials from your doctor or pharmacy?: 1 - Never  Interpreter Needed?: No  Information entered by :: Luciann Gossett LPN   Activities of Daily Living    09/21/2023    2:06 PM 07/16/2023    4:47 PM  In your present state of health, do you have any difficulty performing the following activities:  Hearing? 0 0  Vision? 0 0  Difficulty concentrating or making decisions? 0 0  Walking or climbing stairs? 0   Dressing or bathing? 0   Doing errands, shopping? 0 0  Preparing Food and eating ? N   Using the Toilet? N   In the past six months, have you accidently leaked urine? N   Do you have problems with loss of bowel control? N   Managing your Medications? N   Managing your Finances? N   Housekeeping or managing your Housekeeping? N     Patient Care Team: Raina Bunting, DO as PCP - General (Family Medicine) Alvina Axon, MD as Consulting Physician (Ophthalmology) [place], [facility]  I have updated your Care Teams any recent Medical Services you may have received from other providers in the past year.     Assessment:   This is a routine wellness examination for Kelsey Mendoza.  Hearing/Vision screen Hearing Screening - Comments:: NO AIDS Vision Screening - Comments:: READERS- DR.LYLES IN  [place]   Goals Addressed             This Visit's Progress    DIET - INCREASE WATER INTAKE         Depression Screen  09/21/2023    2:04 PM 09/05/2023    1:23 PM 06/11/2023    9:24 AM 12/12/2022    9:06 AM 09/15/2022    3:38 PM 08/25/2022    8:25 AM 04/20/2022    9:01 AM  PHQ 2/9 Scores  PHQ - 2 Score 0  0 0 0 2 0  PHQ- 9 Score 0  6  0 9 5  Exception Documentation  Patient refusal         Fall Risk     09/21/2023    2:06 PM 09/05/2023    1:23 PM 06/11/2023    9:24 AM 12/12/2022    9:06 AM 09/15/2022    3:40 PM  Fall Risk   Falls in the past year? 0 0 0 0 0  Number falls in past yr: 0    0  Injury with Fall? 0   0 0  Risk for fall due to : No Fall Risks    No Fall Risks  Follow up Falls evaluation completed    Falls prevention discussed;Falls evaluation completed    MEDICARE RISK AT HOME:  Medicare Risk at Home Any stairs in or around the home?: Yes If so, are there any without handrails?: No Home free of loose throw rugs in walkways, pet beds, electrical cords, etc?: Yes Adequate lighting in your home to reduce risk of falls?: Yes Life alert?: No Use of a cane, walker or w/c?: No Grab bars in the bathroom?: Yes Shower chair or bench in shower?: Yes Elevated toilet seat or a handicapped toilet?: No  TIMED UP AND GO:  Was the test performed?  No  Cognitive Function: 6CIT completed        09/21/2023    2:08 PM 09/15/2022    3:47 PM  6CIT Screen  What Year? 0 points 0 points  What month? 0 points 0 points  What time? 0 points 0 points  Count back from 20 0 points 0 points  Months in reverse 0 points 0 points  Repeat phrase 0 points 0 points  Total Score 0 points 0 points    Immunizations Immunization History  Administered Date(s) Administered   Influenza, High Dose Seasonal PF 06/07/2017, 11/18/2018, 11/18/2018, 01/19/2020   Influenza-Unspecified 01/16/2013, 02/04/2015, 03/20/2016   Pneumococcal Conjugate-13 01/19/2020   Pneumococcal Polysaccharide-23  02/04/2015, 06/07/2017   Td (Adult),unspecified 07/18/2013   Tdap 06/27/2021   Zoster Recombinant(Shingrix) 11/18/2018, 03/20/2019    Screening Tests Health Maintenance  Topic Date Due   COVID-19 Vaccine (1) Never done   Hepatitis C Screening  Never done   Colonoscopy  Never done   Diabetic kidney evaluation - Urine ACR  08/25/2023   OPHTHALMOLOGY EXAM  09/07/2023   INFLUENZA VACCINE  11/09/2023   HEMOGLOBIN A1C  12/02/2023   FOOT EXAM  12/12/2023   MAMMOGRAM  02/12/2024   Diabetic kidney evaluation - eGFR measurement  07/16/2024   Medicare Annual Wellness (AWV)  09/20/2024   DEXA SCAN  11/23/2027   DTaP/Tdap/Td (2 - Td or Tdap) 06/28/2031   Pneumococcal Vaccine: 50+ Years  Completed   Zoster Vaccines- Shingrix  Completed   HPV VACCINES  Aged Out   Meningococcal B Vaccine  Aged Out    Health Maintenance  Health Maintenance Due  Topic Date Due   COVID-19 Vaccine (1) Never done   Hepatitis C Screening  Never done   Colonoscopy  Never done   Diabetic kidney evaluation - Urine ACR  08/25/2023   OPHTHALMOLOGY EXAM  09/07/2023  Health Maintenance Items Addressed: Referral sent to GI for colonoscopy; UP TO DATE ON MAMMOGRAM & BDS; UPT TO DATE TDAP, PNA, & SHINGRIX; DECLINES COVID SHOTS  Additional Screening:  Vision Screening: Recommended annual ophthalmology exams for early detection of glaucoma and other disorders of the eye.- HAS EYE APPOINTMENT IN December 05, 2023 Would you like a referral to an eye doctor? No    Dental Screening: Recommended annual dental exams for proper oral hygiene  Community Resource Referral / Chronic Care Management: CRR required this visit?  No   CCM required this visit?  No   Plan:    I have personally reviewed and noted the following in the patient's chart:   Medical and social history Use of alcohol, tobacco or illicit drugs  Current medications and supplements including opioid prescriptions. Patient is not currently taking opioid  prescriptions. Functional ability and status Nutritional status Physical activity Advanced directives List of other physicians Hospitalizations, surgeries, and ER visits in previous 12 months Vitals Screenings to include cognitive, depression, and falls Referrals and appointments  In addition, I have reviewed and discussed with patient certain preventive protocols, quality metrics, and best practice recommendations. A written personalized care plan for preventive services as well as general preventive health recommendations were provided to patient.   Pinky Bright, LPN   [id number]/32/9518   After Visit Summary: (MyChart) Due to this being a telephonic visit, the after visit summary with patients personalized plan was offered to patient via MyChart   Notes: REFERRAL SENT FOR COLONOSCOPY

## 2023-09-24 DIAGNOSIS — L400 Psoriasis vulgaris: Secondary | ICD-10-CM | POA: Diagnosis not present

## 2023-09-25 ENCOUNTER — Other Ambulatory Visit: Payer: Self-pay | Admitting: Family Medicine

## 2023-09-25 DIAGNOSIS — F5101 Primary insomnia: Secondary | ICD-10-CM

## 2023-09-27 ENCOUNTER — Telehealth: Payer: Self-pay

## 2023-09-27 DIAGNOSIS — Z1211 Encounter for screening for malignant neoplasm of colon: Secondary | ICD-10-CM

## 2023-09-27 NOTE — Telephone Encounter (Signed)
 Patient stated that she would like to think about having her colonoscopy with our office because she is unsure if she can tolerate Suprep bowel prep.  She asked if I thought she could tolerate it.  I stated that people have different tolerants and I can't say if she would or would not be able to  tolerate it. I told her there were 7 different preps on the market and if she is unable to tolerate the prep we would change the prep to something else.  She said since she had such a hard time getting to our office she would be afraid that she took the prep and was unable to tolerate it she wouldn't be able to get to anyone in the office because no one answers the phone.  I explained to her that I never received a message from her and she said she never left one.  I told her that if she is uncomfortable having her procedure scheduled by our office she could request her referral to be sent to [facility].  She said she will think about it some more and call back if she decides to schedule.  Thanks,  [place], [place]

## 2023-09-27 NOTE — Telephone Encounter (Signed)
 Pt called to follow up on refill request, says that she is completely out of her current supply. Please advise.

## 2023-09-27 NOTE — Telephone Encounter (Signed)
 Requested medications are due for refill today.  unsure  Requested medications are on the active medications list.  no  Last refill. never  Future visit scheduled.   yes  Notes to clinic.  Never prescribed to pt. Refill refusal not delegated.    Requested Prescriptions  Pending Prescriptions Disp Refills   zolpidem  (AMBIEN  CR) 6.25 MG CR tablet [Pharmacy Med Name: ZOLPIDEM  TART ER 6.25 MG TAB] 30 tablet     Sig: TAKE 1 TABLET BY MOUTH AT BEDTIME AS NEEDED FOR SLEEP     Not Delegated - Psychiatry:  Anxiolytics/Hypnotics Failed - 09/27/2023 11:45 AM      Failed - This refill cannot be delegated      Failed - Urine Drug Screen completed in last 360 days      Passed - Valid encounter within last 6 months    Recent Outpatient Visits           3 weeks ago Other fatigue   [place] [facility] [place], [place] W, NP   3 months ago Type 2 diabetes mellitus with other specified complication, without long-term current use of insulin [facility])   [place] [facility] [place], Kayleen Party, DO       Future Appointments             In 2 months [place], Kayleen Party, DO [place] [facility], [facility]

## 2023-09-27 NOTE — Telephone Encounter (Signed)
 Requested medication (s) are due for refill today:   Provider to review  Requested medication (s) are on the active medication list:   No not on list  Future visit scheduled:   Yes 9/5   Last ordered: Ambien  CR 6.25 mg is not on her medication list.  Non delegated refill, plus not on med list.   Requested Prescriptions  Pending Prescriptions Disp Refills   zolpidem  (AMBIEN  CR) 6.25 MG CR tablet [Pharmacy Med Name: ZOLPIDEM  TART ER 6.25 MG TAB] 30 tablet     Sig: TAKE 1 TABLET BY MOUTH AT BEDTIME AS NEEDED FOR SLEEP     Not Delegated - Psychiatry:  Anxiolytics/Hypnotics Failed - 09/27/2023 11:48 AM      Failed - This refill cannot be delegated      Failed - Urine Drug Screen completed in last 360 days      Passed - Valid encounter within last 6 months    Recent Outpatient Visits           3 weeks ago Other fatigue   [place] [facility] [place], [place] W, NP   3 months ago Type 2 diabetes mellitus with other specified complication, without long-term current use of insulin [facility])   [place] [facility] [place], Kayleen Party, DO       Future Appointments             In 2 months [place], Kayleen Party, DO [facility], [facility]

## 2023-10-01 ENCOUNTER — Telehealth: Payer: Self-pay

## 2023-10-01 MED ORDER — TIRZEPATIDE 10 MG/0.5ML ~~LOC~~ SOAJ: 10.0000 mg | SUBCUTANEOUS | 0 refills | Status: DC

## 2023-10-01 NOTE — Telephone Encounter (Signed)
 Patient advised per Angeline.

## 2023-10-01 NOTE — Telephone Encounter (Signed)
 Mounjaro  10 mg sent to pharmacy.  She should follow-up with her PCP as previously scheduled

## 2023-10-01 NOTE — Addendum Note (Signed)
 Addended by: ANTONETTE ANGELINE ORN on: 10/01/2023 12:30 PM   Modules accepted: Orders

## 2023-10-01 NOTE — Telephone Encounter (Signed)
 Copied from CRM [id number]. Topic: Clinical - Medication Question >> Oct 01, 2023  8:26 AM Larissa S wrote: Reason for CRM: Requesting to have mounjuaro dosage taken down to around 10 mg. She states the current dosage is causing her not to eat.  [phone]

## 2023-10-26 DIAGNOSIS — M7052 Other bursitis of knee, left knee: Secondary | ICD-10-CM | POA: Diagnosis not present

## 2023-10-26 DIAGNOSIS — Z96652 Presence of left artificial knee joint: Secondary | ICD-10-CM | POA: Diagnosis not present

## 2023-11-06 ENCOUNTER — Telehealth: Payer: Self-pay

## 2023-11-06 NOTE — Telephone Encounter (Signed)
 Copied from CRM [id number]. Topic: Clinical - Medication Question >> Nov 06, 2023  8:36 AM Charlet HERO wrote: Reason for CRM: Patient would like to have a med to be called in for toe nail fungus, she has tried ovc and that did not work. She is stating that the toe looks like it is getting worse.

## 2023-11-07 ENCOUNTER — Encounter: Payer: Self-pay | Admitting: Family Medicine

## 2023-11-07 ENCOUNTER — Ambulatory Visit (INDEPENDENT_AMBULATORY_CARE_PROVIDER_SITE_OTHER): Admitting: Family Medicine

## 2023-11-07 VITALS — BP 124/72 | HR 60 | Ht 64.0 in | Wt 162.5 lb

## 2023-11-07 DIAGNOSIS — B351 Tinea unguium: Secondary | ICD-10-CM

## 2023-11-07 DIAGNOSIS — F5101 Primary insomnia: Secondary | ICD-10-CM | POA: Diagnosis not present

## 2023-11-07 DIAGNOSIS — G4733 Obstructive sleep apnea (adult) (pediatric): Secondary | ICD-10-CM | POA: Diagnosis not present

## 2023-11-07 MED ORDER — TERBINAFINE HCL 250 MG PO TABS: 250.0000 mg | ORAL_TABLET | Freq: Every day | ORAL | 2 refills | Status: AC

## 2023-11-07 NOTE — Patient Instructions (Addendum)
 Thank you for coming to the office today.  Okay to double the Zolpidem  6.25 x 2 = 12.5mg  at night for sleep  Take Terbinafine  250mg  daily for toenail if it is fungus it will improve if not then it is the nail bed we can refer to Podiatry.  Please schedule a Follow-up Appointment to: Return if symptoms worsen or fail to improve.  If you have any other questions or concerns, please feel free to call the office or send a message through MyChart. You may also schedule an earlier appointment if necessary.  Additionally, you may be receiving a survey about your experience at our office within a few days to 1 week by e-mail or mail. We value your feedback.  Marsa Officer, DO [facility], [place]

## 2023-11-07 NOTE — Progress Notes (Addendum)
 Subjective:    Patient ID: Kelsey Mendoza, female    DOB: 1950/07/01, 73 y.o.   MRN: [id number]  Kelsey Mendoza is a 73 y.o. female presenting on 11/07/2023 for Nail Problem   HPI  Discussed the use of AI scribe software for clinical note transcription with the patient, who gave verbal consent to proceed.  History of Present Illness   Kelsey Mendoza is a 73 year old female who presents with persistent thickening of the left great toenail.  Onychomycosis L Great Toenail - Persistent thickening of the left great toenail since at least June 11, 2023 - Daily application of topical treatment anti-fungal, sometimes twice daily, without improvement - Nail remains thick and difficult to trim - No improvement over the past five months - No history of trauma to the toenail  Chronic knee pain - Persistent knee pain despite treatment - Currently taking Celebrex , with minimal pain relief - X-rays performed (results not specified) - Cortisone injection provided only temporary relief - Prescription for tramadol , not taken regularly  Insomnia - Difficulty maintaining sleep despite taking Ambien  - Takes Ambien  at 9 PM, but does not fall asleep until 11:30 PM - Wakes up by 4:30 AM - Ambien  ineffective in maintaining sleep throughout the night   OSA on CPAP - Patient reports prior history of dx OSA and on CPAP - Today reports that sleep apnea is well controlled. She uses the CPAP machine every night. Tolerates the machine well, and thinks that sleeps better with it. She has benefited from using CPAP machine.         11/07/2023    4:26 PM 09/21/2023    2:04 PM 06/11/2023    9:24 AM  Depression screen PHQ 2/9  Decreased Interest 0 0 0  Down, Depressed, Hopeless 0 0 0  PHQ - 2 Score 0 0 0  Altered sleeping 3 0 3  Tired, decreased energy 1 0 3  Change in appetite 0 0 0  Feeling bad or failure about yourself  0 0 0  Trouble concentrating 0 0 0  Moving slowly or  fidgety/restless 0 0 0  Suicidal thoughts 0 0 0  PHQ-9 Score 4 0 6  Difficult doing work/chores Somewhat difficult Not difficult at all Not difficult at all       11/07/2023    4:27 PM 06/11/2023    9:24 AM 12/12/2022    9:06 AM 08/25/2022    8:25 AM  GAD 7 : Generalized Anxiety Score  Nervous, Anxious, on Edge 0 0 0 0  Control/stop worrying 0 0 0 0  Worry too much - different things 0 0 0 0  Trouble relaxing 0 0 0 0  Restless 0 0 0 0  Easily annoyed or irritable 0 0 0 0  Afraid - awful might happen 0 0 0 0  Total GAD 7 Score 0 0 0 0  Anxiety Difficulty Not difficult at all       Social History   Tobacco Use   Smoking status: Former    Current packs/day: 0.00    Types: Cigarettes    Quit date: 03/01/1983    Years since quitting: 40.7   Smokeless tobacco: Never  Vaping Use   Vaping status: Never Used  Substance Use Topics   Alcohol use: No   Drug use: No    Review of Systems Per HPI unless specifically indicated above     Objective:    BP 124/72 (BP Location: Left Arm,  Patient Position: Sitting, Cuff Size: Normal)   Pulse 60   Ht 5' 4 (1.626 m)   Wt 162 lb 8 oz (73.7 kg)   SpO2 95%   BMI 27.89 kg/m   Wt Readings from Last 3 Encounters:  11/07/23 162 lb 8 oz (73.7 kg)  09/05/23 161 lb 6.4 oz (73.2 kg)  07/16/23 165 lb (74.8 kg)    Physical Exam Vitals and nursing note reviewed.  Constitutional:      General: She is not in acute distress.    Appearance: Normal appearance. She is well-developed. She is not diaphoretic.     Comments: Well-appearing, comfortable, cooperative  HENT:     Head: Normocephalic and atraumatic.  Eyes:     General:        Right eye: No discharge.        Left eye: No discharge.     Conjunctiva/sclera: Conjunctivae normal.  Cardiovascular:     Rate and Rhythm: Normal rate.  Pulmonary:     Effort: Pulmonary effort is normal.  Skin:    General: Skin is warm and dry.     Findings: Lesion (left great toenail thickening  discoloration) present. No erythema or rash.  Neurological:     Mental Status: She is alert and oriented to person, place, and time.  Psychiatric:        Mood and Affect: Mood normal.        Behavior: Behavior normal.        Thought Content: Thought content normal.     Comments: Well groomed, good eye contact, normal speech and thoughts     Results for orders placed or performed in visit on 09/05/23  Vitamin B12   Collection Time: 09/05/23  1:35 PM  Result Value Ref Range   Vitamin B-12 >2,000 (H) 200 - 1,100 pg/mL  VITAMIN D  25 Hydroxy (Vit-D Deficiency, Fractures)   Collection Time: 09/05/23  1:35 PM  Result Value Ref Range   Vit D, 25-Hydroxy 71 30 - 100 ng/mL  Rocky mtn spotted fvr abs pnl(IgG+IgM)   Collection Time: 09/05/23  1:35 PM  Result Value Ref Range   RMSF IgG Not Detected Not Detected   RMSF IgM Not Detected Not Detected  B. Burgdorfi Antibodies   Collection Time: 09/05/23  1:35 PM  Result Value Ref Range   B burgdorferi Ab IgG+IgM <0.90 index      Assessment & Plan:   Problem List Items Addressed This Visit     OSA on CPAP   Other Visit Diagnoses       Onychomycosis of left great toe    -  Primary   Relevant Medications   terbinafine  (LAMISIL ) 250 MG tablet     Primary insomnia       Relevant Medications   zolpidem  (AMBIEN  CR) 6.25 MG CR tablet        Knee pain due to acute tendonitis Persistent knee pain from acute tendonitis. Corticosteroid injection provided temporary relief. Celebrex  ineffective. Discussed tramadol  for pain management, noting possible drug interactions. - Continue Celebrex . - Discuss tramadol  use, monitor for interactions.  Onychomycosis of left great toenail Chronic onychomycosis unresponsive to topical treatment x 5 months. Oral antifungal therapy considered more effective. Discussed potential nail bed damage if oral treatment fails. - Prescribe terbinafine  250 mg daily for three months, with two refills. - Monitor liver  function post-treatment. - Discontinue topical antifungal. - Consider podiatrist referral if oral treatment fails.  Insomnia Chronic insomnia despite Ambien  CR 6.25 mg. Discussed  risks of dose increase, especially in older patients. Considered Tylenol  PM as an alternative. - Increase Ambien  CR to 12.5 mg nightly. Double current dose. We can order 12.5mg  in future if interested, would likely need goodrx - Consider Tylenol  PM with Ambien  if needed. - Monitor effectiveness and side effects. - Discuss alternative treatments if ineffective.     OSA on CPAP Controlled chronic OSA on CPAP - Good adherence to CPAP nightly - Continue current CPAP therapy, patient seems to be benefiting from therapy    No orders of the defined types were placed in this encounter.   Meds ordered this encounter  Medications   terbinafine  (LAMISIL ) 250 MG tablet    Sig: Take 1 tablet (250 mg total) by mouth daily.    Dispense:  30 tablet    Refill:  2    Follow up plan: Return if symptoms worsen or fail to improve.   Marsa Officer, DO [facility] 11/08/2023, 4:39 PM

## 2023-11-16 DIAGNOSIS — G4733 Obstructive sleep apnea (adult) (pediatric): Secondary | ICD-10-CM | POA: Diagnosis not present

## 2023-11-28 DIAGNOSIS — Z96652 Presence of left artificial knee joint: Secondary | ICD-10-CM | POA: Diagnosis not present

## 2023-11-28 DIAGNOSIS — M7052 Other bursitis of knee, left knee: Secondary | ICD-10-CM | POA: Diagnosis not present

## 2023-11-28 NOTE — Progress Notes (Signed)
 HPI:  Kelsey Mendoza is a 73 y.o. female who presents for follow-up 4 months to post left total knee replacement.  We saw her 1 month ago and she was having great improvement in pain and function but developed some tenderness consistent with tendinitis around the medial and lateral aspect of her knee.  We did a pes anserine bursa injection and she got minimal short-term relief from it and reports ongoing pain in the same distributions.  She denies any frank instability in the knee but does have sensations of pain which make it feel like it might want a give out.  She has not had any falls.  The patient denies fevers, chills, shortness of breath, chest pain, recent illness, or any trauma.  Current Outpatient Medications  Medication Sig Dispense Refill  . acetaminophen  (TYLENOL ) 500 MG tablet Take 1,000 mg by mouth every 8 (eight) hours    . amLODIPine  (NORVASC ) 5 MG tablet TAKE 1 TABLET BY MOUTH EVERY DAY 90 tablet 3  . amoxicillin  (AMOXIL ) 500 MG capsule TAKE 4 CAPSULES BY MOUTH 1 HOUR PRIOR TO DENTAL WORK    . amoxicillin  (AMOXIL ) 500 MG tablet Take 2,000 mg by mouth 1 HOUR PRIOR TO DENTAL WORK    . celecoxib  (CELEBREX ) 100 MG capsule TAKE 1 CAPSULE BY MOUTH 2 TIMES DAILY FOR 30 DAYS. 60 capsule 0  . FUROsemide  (LASIX ) 40 MG tablet TAKE 1 TABLET (40 MG TOTAL) BY MOUTH ONCE DAILY AS NEEDED 90 tablet 1  . hydrocortisone  2.5 % cream APPLY 1 (ONE) APPLICATION(S) TOPICAL 2 (TWO) TIMES A DAY TO FACE    . ketoconazole  (NIZORAL ) 2 % shampoo WASH SCALP 3(THREE) TIMES A WEEK. LEAVE IT PLACE FOR 30 MINUTES BEFORE RINSING.    . lidocaine  (LIDODERM ) 5 % patch Place 1 patch onto the skin daily Apply patch to the most painful area for up to 12 hours in a 24 hour period. 30 patch 0  . methocarbamoL  (ROBAXIN ) 500 MG tablet Take 1 tablet (500 mg total) by mouth 3 (three) times daily 30 tablet 0  . MOUNJARO  15 mg/0.5 mL pen injector Inject 15 mg subcutaneously once a week    . MOUNJARO  7.5 mg/0.5 mL PnIj INJECT 7.5  MG SUBCUTANEOUSLY WEEKLY    . rosuvastatin  (CRESTOR ) 5 MG tablet TAKE 1 TABLET BY MOUTH EVERY DAY 90 tablet 3   No current facility-administered medications for this visit.   Allergies  Allergen Reactions  . Trazodone Other (See Comments)    Caused violent, disturbing nightmares    Past Medical History:  Diagnosis Date  . Borderline diabetes    A1C 6.1%- 07/2013; diet-controlled  . Broken wrist, right, sequela   . Essential hypertension 09/03/2013  . Hx of bladder cancer   . Hyperlipidemia    LDL 159- 07/2013  . Hypertension   . Insomnia   . Menopausal syndrome    Hx of menopausal syndrome  . Obesity   . Osteoarthritis   . Sleep apnea    Uses CPAP machine   Past Surgical History:  Procedure Laterality Date  . COLONOSCOPY  07/16/12   diverticulosis in sigmoid, descending, transverse, and ascending colon and cecum  . JOINT REPLACEMENT Right 12/29/2016   Right medial Makoplasty  . ORIF DISTAL RADIUS FRACTURE  06/06/2017   Dr. Kevin Krasinki  . CATARACT EXTRACTION W/ INTRAOCULAR LENS IMPLANT & ANTERIOR VITRECTOMY, BILATERAL  08/2019   Right 08/19/19. Left 09/02/19  .  Left knee arthroscopy, partial medial meniscectomy, and chondroplasty  10/06/2019  Dr Hooten  . BIOPSY SKIN ARM Bilateral    Left arm- Precancerous  . BIOPSY SKIN LEG Right    Right leg-Cancerous  . Bladder cancer surgeries     x 3  . HYSTERECTOMY     Vaginal hysterectomy  . Lumbar back surgery    . Right knee surgery Right   . TURP VAPORIZATION      Family History  Problem Relation Name Age of Onset  . Stroke Mother    . Heart disease Mother    . Myocardial Infarction (Heart attack) Mother    . Heart disease Father    . No Known Problems Sister    . No Known Problems Brother      Social History   Socioeconomic History  . Marital status: Widowed  . Number of children: 3  . Years of education: 78  Occupational History  . Occupation: Retired  Tobacco Use  . Smoking status: Former    Current  packs/day: 0.00    Average packs/day: 1 pack/day for 10.0 years (10.0 ttl pk-yrs)    Types: Cigarettes    Start date: 04/11/1975    Quit date: 04/10/1985    Years since quitting: 38.6  . Smokeless tobacco: Never  Vaping Use  . Vaping status: Never Used  Substance and Sexual Activity  . Alcohol use: No    Alcohol/week: 0.0 standard drinks of alcohol  . Drug use: No  . Sexual activity: Defer    Partners: Male  Social History Narrative   Marital Status- Married   Lives with husband   Employment- Retired   Exercise hx- Research officer, political party daily   Religious Affiliation- Catholic   Social Drivers of Health   Financial Resource Strain: Low Risk  (10/26/2023)   Overall Financial Resource Strain (CARDIA)   . Difficulty of Paying Living Expenses: Not hard at all  Food Insecurity: No Food Insecurity (10/26/2023)   Hunger Vital Sign   . Worried About Programme researcher, broadcasting/film/video in the Last Year: Never true   . Ran Out of Food in the Last Year: Never true  Transportation Needs: No Transportation Needs (10/26/2023)   PRAPARE - Transportation   . Lack of Transportation (Medical): No   . Lack of Transportation (Non-Medical): No    Review of Systems:  A comprehensive 14 point ROS was performed, reviewed, and the pertinent orthopaedic findings are documented in the HPI.  Exam: Vitals:   11/28/23 1410  BP: 132/68  Weight: 72.6 kg (160 lb)  Height: 162.6 cm (5' 4)  PainSc:   6  PainLoc: Knee   General/Constitutional: The patient appears to be well-nourished, well-developed, and in no acute distress. Neuro/Psych: Normal mood and affect, oriented to person, place and time.  Left lower Extremity Exam  The left knee noted to have a well-healing surgical incision on the midline with no evidence of erythema or drainage.  No evidence for infection at this time.   Some tenderness to palpation over the pes anserine bursa and Gertie's tubercle which reproduces her pain The patient is ambulating with no assistive  devices.   Range of motion of the knee is 0-125.   The knee is stable to varus/valgus stress and anterior drawer in flexion Neurovascularly intact distally, able to dorsiflex with good sensation over the foot.   Negative Homans' sign no calf tenderness to palpation.     X-rays/MRI/Lab data:  3 view x-rays AP, lateral, sunrise of the left knee ordered and taken today in clinic and images reviewed  by myself show status post total knee arthroplasty with components in appropriate position.  Patella tracking midline.  No evidence of periprosthetic loosening or fracture.  Assessment: Encounter Diagnosis  Name Primary?  . Status post total left knee replacement Yes    Plan: Patient is having ongoing pain consistent with tendinitis over Gertie's tubercle and distal IT band as well as pes anserine bursa and the distal hamstring tendons.  She has no joint line tenderness or any increased motion in flexion concerning for flexion instability.  She was unable to get in with physical therapy after her last visit so we will put in a new prescription for physical therapy and work on getting her in for manual treatments to see if we can decrease the inflammation and calm down the tendinitis.  She did not feel that the brace helped significantly at this point.  Continue with over-the-counter medications and have her follow-up after 4 to 6 weeks of PT.  Patient agrees with this plan all questions answered.

## 2023-12-05 DIAGNOSIS — H52203 Unspecified astigmatism, bilateral: Secondary | ICD-10-CM | POA: Diagnosis not present

## 2023-12-05 DIAGNOSIS — E119 Type 2 diabetes mellitus without complications: Secondary | ICD-10-CM | POA: Diagnosis not present

## 2023-12-05 DIAGNOSIS — H524 Presbyopia: Secondary | ICD-10-CM | POA: Diagnosis not present

## 2023-12-05 DIAGNOSIS — Z961 Presence of intraocular lens: Secondary | ICD-10-CM | POA: Diagnosis not present

## 2023-12-05 DIAGNOSIS — H04123 Dry eye syndrome of bilateral lacrimal glands: Secondary | ICD-10-CM | POA: Diagnosis not present

## 2023-12-05 DIAGNOSIS — H35373 Puckering of macula, bilateral: Secondary | ICD-10-CM | POA: Diagnosis not present

## 2023-12-05 LAB — HM DIABETES EYE EXAM

## 2023-12-12 NOTE — Therapy (Signed)
 OUTPATIENT PHYSICAL THERAPY LOWER EXTREMITY EVALUATION   Patient Name: Kelsey Mendoza MRN: [id number] DOB:[birth year], 73 y.o., female Today's Date: 12/13/2023  END OF SESSION:    PT End of Session - 12/13/23 1023     Visit Number 1    Number of Visits 24    Date for PT Re-Evaluation 03/06/24    PT Start Time 1020    PT Stop Time 1105    PT Time Calculation (min) 45 min    Activity Tolerance Patient tolerated treatment well;No increased pain    Behavior During Therapy WFL for tasks assessed/performed          Past Medical History:  Diagnosis Date   Bladder cancer (HCC) 2000   resection x 3 and BCG therapy   CAD (coronary artery disease)    Chronic kidney disease, stage 3 (HCC)    Diastolic dysfunction    Diverticulosis    Fatigue    History of bilateral cataract extraction    HLD (hyperlipidemia)    HTN (hypertension)    Insomnia    Long-term current use of immunosuppressive biologic agent    a.) deucravacitinib   Obstructive sleep apnea with dependence on continuous positive airway pressure (CPAP)    Pericardial effusion    Primary osteoarthritis of left knee    Psoriasis of scalp    a.) Tx'd with deucravacitinib   Type 2 diabetes mellitus (HCC)    Past Surgical History:  Procedure Laterality Date   BLADDER SURGERY     x 3 resection bladder cancer   BROW LIFT Bilateral 05/27/2021   Procedure: BLEPHAROPLASTY UPPER EYELID; W/EXCESS SKIN AND BROW PTOSIS REPAIR BILATERAL;  Surgeon: Ashley Greig HERO, MD;  Location: [facility];  Service: Ophthalmology;  Laterality: Bilateral;   CARPAL TUNNEL RELEASE Right 06/06/2017   Procedure: CARPAL TUNNEL RELEASE;  Surgeon: Marchia Drivers, MD;  Location: [facility];  Service: Orthopedics;  Laterality: Right;   CATARACT EXTRACTION W/ INTRAOCULAR LENS  IMPLANT, BILATERAL Bilateral 08/2019   COLONOSCOPY  07/16/2012   EYE SURGERY Left 11/26/2019   Dr. Elner, Vit, Mem Benedetto   I & D EXTREMITY Right 06/06/2017   Procedure:  IRRIGATION AND DEBRIDEMENT EXTREMITY--RIGHT ARM;  Surgeon: Marchia Drivers, MD;  Location: [facility];  Service: Orthopedics;  Laterality: Right;   KNEE ARTHROSCOPY Left 10/06/2019   Procedure: ARTHROSCOPY KNEE,PARTIAL MEDIAL MENISECTOMY,PARTIAL CHONDROPLASTY;  Surgeon: Mardee Lynwood SQUIBB, MD;  Location: [facility];  Service: Orthopedics;  Laterality: Left;   LUMBAR SPINE SURGERY     MEDIAL PARTIAL KNEE REPLACEMENT Right 12/29/2016   Makoplasty   OPEN REDUCTION INTERNAL FIXATION (ORIF) DISTAL RADIAL FRACTURE N/A 06/06/2017   Procedure: OPEN REDUCTION INTERNAL FIXATION (ORIF) DISTAL RADIAL FRACTURE;  Surgeon: Marchia Drivers, MD;  Location: [facility];  Service: Orthopedics;  Laterality: N/A;   TOTAL KNEE ARTHROPLASTY Left 07/16/2023   Procedure: ARTHROPLASTY, KNEE, TOTAL;  Surgeon: Lorelle Hussar, MD;  Location: [facility];  Service: Orthopedics;  Laterality: Left;   VAGINAL HYSTERECTOMY     VITRECTOMY Bilateral    Patient Active Problem List   Diagnosis Date Noted   S/P TKR (total knee replacement), left 07/16/2023   Primary osteoarthritis of left knee 06/11/2023   Elevated TSH 04/18/2022   Benign hypertension with CKD (chronic kidney disease) stage III (HCC) 01/17/2022   Hyperlipidemia associated with type 2 diabetes mellitus (HCC) 01/17/2022   OSA on CPAP 06/13/2021   Posterior capsular opacification, right 03/25/2020   Postoperative follow-up 03/18/2020   Left posterior capsular opacification 02/12/2020  Left epiretinal membrane 11/10/2019   Right epiretinal membrane 11/10/2019   Cystoid macular edema of left eye 11/10/2019   Glaucoma suspect, bilateral 11/10/2019   Cancer (HCC) 10/04/2019   Closed Colles' fracture 07/03/2017   Distal radius fracture, right 06/05/2017   Contusion of left knee 11/27/2016   Contusion of right knee 11/27/2016   Depression 03/26/2014   Depressed affect 09/25/2013   Difficulty sleeping 09/25/2013   Type 2 diabetes mellitus with other specified complication  (HCC) 09/03/2013   Diverticular disease 07/17/2012   Diverticulosis of colon 07/17/2012   Arthritis 07/14/2012   Bladder cancer (HCC) 07/14/2012    PCP: Marsa Officer, DO  REFERRING PROVIDER: Arthea Sheer, MD  REFERRING DIAG: M70.52 (ICD-10-CM) - Other bursitis of knee, left knee   THERAPY DIAG:   Chronic pain of left knee  Muscle weakness (generalized)  Unsteadiness on feet  Abnormality of gait  Rationale for Evaluation and Treatment: Rehabilitation  ONSET DATE: L TKA 07/16/23; knee pain noted at physician visit 10/26/23.   SUBJECTIVE:   SUBJECTIVE STATEMENT: Pt stated that she had R knee replacement a couple of years ago without complications, healed well.  Following most recent L knee surgery, pt reported that she saw PT for several weeks. Pt reported continuing to do HEP provided by previous PT to include following exercises:  Heel raises Heel slides Squats Pt educated to make list/bring in handout of exercises that she was previously given.   Pt stated that she was told she is experiencing tendonitis. Pt states that L knee pain is sharp, along lateral side of knee near Gerdy's tubercle, arc under knee. Pt stated that pain is tolerable during the day, but it wakes her up at night; usually not able to go back to sleep. Pt stated that her pain gets worse as the day goes on & is dependent on level of activity. When pain is at it's worst, it takes 5-10 min. to calm down to a more tolerable level.   Pt reports that her pain is exacerbated by movement throughout the day, as well as moving the wrong way. Pt stated that she tries to avoid twisting at the knee.  Pt reports that ice helps, tylenol  helps to a degree, & celebrex  doesn't do much.   Pt reports most difficulty getting in & out of the car; Pt reported that getting up from chairs can be difficult; pain limits her from walking & being more active, would like to be able to walk dog.  Pt states that she  does have goats, uses walking sticks for balance when outdoors to tend to them.     PERTINENT HISTORY: PMH: DM, HTN, CKD, hyperlipidemia, obesity, OA, OSA on CPAP, hx of bladder cancer, insomnia   Per ortho MD note 10/26/23: KINZLY PIERRELOUIS is a 73 y.o. female who presents for follow-up 15-month status post left total knee replacement. Patient reports she was doing fantastic improving with pain and function and then she increased going to the gym and other activities. She reports over the last few weeks she developed a new sharp pain over the proximal medial tibia which wraps around posteriorly behind her knee and runs approximately. She reports with certain twisting motions and get up to an 8 out of 10 at its worst. She reports minimal improvement with Voltaren or pain medications. She denies any trauma to the knee. Denies any fevers chills systemic symptoms or concerning signs for infection...   The patient had been doing fantastic after her left  total knee replacement. She has developed some pes anserine bursitis and hamstring tendinitis after increasing her activities. There is no instability or increased motion in flexion on her exam. We discussed treatment options for the tendinitis including topical medications, local steroid injections, bracing, physical therapy and home exercises. Patient would like to try some anti-inflammatories and a local injection and do some physical therapy for the knee. Will also give her a brace to help calm down the inflammation. We will see how she does with these interventions and have her follow-up in a few months for repeat evaluation. All questions answered she agrees with above plan.    Per ortho MD note 11/28/23: KLARISSA MCILVAIN is a 73 y.o. female who presents for follow-up 4 months to post left total knee replacement. We saw her 1 month ago and she was having great improvement in pain and function but developed some tenderness consistent with tendinitis  around the medial and lateral aspect of her knee. We did a pes anserine bursa injection and she got minimal short-term relief from it and reports ongoing pain in the same distributions. She denies any frank instability in the knee but does have sensations of pain which make it feel like it might want a give out. She has not had any falls. The patient denies fevers, chills, shortness of breath, chest pain, recent illness, or any trauma.   PAIN:  Are you having pain? Yes: NPRS scale: 10/10 at worst, 1/10 at best Pain location: lateral knee; arc pattern below patella Pain description: sharp Aggravating factors: movement Relieving factors: ice, tylenol   PRECAUTIONS: None  RED FLAGS: None   WEIGHT BEARING RESTRICTIONS: No  FALLS:  Has patient fallen in last 6 months? No  LIVING ENVIRONMENT: Lives with: lives alone Lives in: House/apartment Stairs: No Has following equipment at home: Vannie - 2 wheeled, Tour manager, Grab bars, and raised commode  OCCUPATION: retired Lexicographer, former LPN  PLOF: Independent  PATIENT GOALS: reduce knee pain; get stronger; would like to be more active, be able to walk dog   NEXT MD VISIT: follow-up w/ ortho in ~1 mo.   OBJECTIVE:  Note: Objective measures were completed at Evaluation unless otherwise noted.  DIAGNOSTIC FINDINGS: Per ortho MD note 11/28/23: 3 view x-rays AP, lateral, sunrise of the left knee ordered and taken today in clinic and images reviewed by myself show status post total knee arthroplasty with components in appropriate position. Patella tracking midline. No evidence of periprosthetic loosening or fracture.    PATIENT SURVEYS:  LEFS: Extreme difficulty/unable (0), Quite a bit of difficulty (1), Moderate difficulty (2), Little difficulty (3), No difficulty (4) Survey date:   12/13/23  Any of your usual work, housework or school activities 3  2. Usual hobbies, recreational or sporting activities 4  3. Getting  into/out of the bath 4  4. Walking between rooms 3  5. Putting on socks/shoes 3  6. Squatting  3  7. Lifting an object, like a bag of groceries from the floor 3  8. Performing light activities around your home 4  9. Performing heavy activities around your home 1  10. Getting into/out of a car 2  11. Walking 2 blocks 2  12. Walking 1 mile 1  13. Going up/down 10 stairs (1 flight) 2  14. Standing for 1 hour 1  15.  sitting for 1 hour 3  16. Running on even ground 0  17. Running on uneven ground 0  18. Making sharp turns while  running fast 0  19. Hopping  0  20. Rolling over in bed 1  Score total:  40/80     COGNITION: Overall cognitive status: Within functional limits for tasks assessed     SENSATION: Light touch: WFL Pt reports some numbness noted along L lateral knee, but able to feel light touch.   EDEMA:  Mild swelling noted in L knee consistent with s/p TKA  PALPATION: tenderness at L lateral knee near Gerdy's tubercle, pt described as burning sensation   LOWER EXTREMITY ROM:  Active ROM Right eval Left eval  Hip flexion    Hip extension    Hip abduction    Hip adduction    Hip internal rotation    Hip external rotation    Knee flexion [facility] WFL; decreased compared to R  Knee extension [facility]  Ankle dorsiflexion    Ankle plantarflexion    Ankle inversion    Ankle eversion     (Blank rows = not tested)  LOWER EXTREMITY MMT:  MMT Right eval Left eval  Hip flexion 4- 3+*  Hip extension    Hip abduction 4+ 4+  Hip adduction 4+ 4+  Hip internal rotation    Hip external rotation    Knee flexion 4+ 4-  Knee extension 4+ 4-  Ankle dorsiflexion 4- 4-  Ankle plantarflexion 5 5  Ankle inversion    Ankle eversion     (Blank rows = not tested) *=pain reported by pt   FUNCTIONAL TESTS:  5 times sit to stand: 18.19 sec Timed up and go (TUG): 10.23 sec 10 meter walk test: average normal walking speed: 1.11 m/s; average fast walking speed: 1.21  m/s  GAIT: Assistive device utilized: None Level of assistance: Complete Independence Comments: antalgic gait noted; L knee buckling with increased gait speed during                                                                                                                                TREATMENT DATE: 12/13/2023   PT evaluation completed to include subjective history, patient reported outcome measure (see above for LEFS scoring), & following physical performance measures:   Five times Sit to Stand Test (FTSS) "Stand up and sit down as quickly as possible 5 times, keeping your arms folded across your chest."    TIME: 18.19 seconds  Times > 13.6 seconds is associated with increased disability and morbidity (Guralnik, 2000) Times > 15 seconds is predictive of recurrent falls in healthy individuals aged 61 and older (Buatois, et al., 2008) Normal performance values in community dwelling individuals aged 11 and older (Bohannon, 2006): 60-69 years: 11.4 seconds 70-79 years: 12.6 seconds 80-89 years: 14.8 seconds  MCID: >= 2.3 seconds for Vestibular Disorders (Meretta, 2006)    Participated in Timed Up and Go (TUG): 1st trial: 10.23 seconds without AD; antalgic gait noted    10 Meter Walk Test: Patient instructed to walk 10  meters (32.8 ft) as quickly and as safely as possible at their normal speed x2 and at a fast speed x2. Time measured from 2 meter mark to 8 meter mark to accommodate ramp-up and ramp-down.  Normal speed 1: 1.13 m/s (8.86 seconds) Normal speed 2: 1.08 m/s (9.26 seconds) Average Normal speed: 1.11 m/s Fast speed 1: 1.26 m/s (7.91 seconds) *L knee buckle noted. Pt reported the knee buckling happens several times a week, sometimes it clicks too.  Fast speed 2: 1.16 m/s (8.61 seconds) Average Fast speed: 1.21 m/s Cut off scores: <0.4 m/s = household Ambulator, 0.4-0.8 m/s = limited community Ambulator, >0.8 m/s = community Ambulator, >1.2 m/s = crossing a  street, <1.0 = increased fall risk MCID 0.05 m/s (small), 0.13 m/s (moderate), 0.06 m/s (significant)  (ANPTA Core Set of Outcome Measures for Adults with Neurologic Conditions, 2018)    PATIENT EDUCATION:  Education details: POC as related to future sessions, implementation of HEP at next session Person educated: Patient Education method: Explanation Education comprehension: verbalized understanding and needs further education  HOME EXERCISE PROGRAM: Establish at future visit  ASSESSMENT:  CLINICAL IMPRESSION: Patient is a 73 y.o. female who was seen today for physical therapy evaluation and treatment for L knee pain s/p TKA 07/16/23.  The pt will benefit from further skilled PT to improve these deficits in order to increase QOL and ease/safety with ADLs.  Pt with bilat LE weakness, most notably L hip weakness; noted L knee buckling with increased gait speed. Pt reported increased pain with gait at increased distances, limiting her ability to increase activity.   OBJECTIVE IMPAIRMENTS: Abnormal gait, decreased activity tolerance, decreased balance, decreased knowledge of condition, decreased mobility, difficulty walking, decreased ROM, decreased strength, increased edema, increased fascial restrictions, and pain.   ACTIVITY LIMITATIONS: lifting, bending, sitting, standing, squatting, sleeping, stairs, transfers, bed mobility, dressing, and locomotion level  PARTICIPATION LIMITATIONS: cleaning, laundry, and community activity  PERSONAL FACTORS: Age, Fitness, Sex, Time since onset of injury/illness/exacerbation, and 3+ comorbidities: DM, HTN, CKD, hyperlipidemia, obesity, OA, OSA on CPAP, hx of bladder cancer, insomnia are also affecting patient's functional outcome.   REHAB POTENTIAL: Good  CLINICAL DECISION MAKING: Stable/uncomplicated  EVALUATION COMPLEXITY: Low   GOALS: Goals reviewed with patient? Yes  SHORT TERM GOALS: Target date: 01/25/24 Patient will be independent with  home exercise program to improve strength/mobility for increased functional independence with ADLs and mobility. Baseline: to be established at future visit  Goal status: INITIAL  LONG TERM GOALS: Target date: 03/07/24   Patient will complete five times sit to stand test (5XSTS) in 12.8 seconds indicating an increased LE strength and improved balance & match age-based norms. Baseline: 18.19 sec Goal status: INITIAL  2.  Patient will increase 10 meter walk test to >1.66m/s without antalgic gait or knee buckling as to improve gait speed for better community ambulation and to reduce fall risk. Baseline: normal: 1.11 m/s with antalgic gait; fast: 1.21 m/s with antalgic gait, knee buckling Goal status: INITIAL  3.  Patient will increase lower extremity functional scale to >60/80 to demonstrate improved functional mobility and increased tolerance with ADLs.  Baseline: 40/80 Goal status: INITIAL  4.  Patient will increase six minute walk test distance to >1070ft for progression to community level ambulation, demonstrating improved gait endurance.  Baseline: need to assess Goal status: INITIAL  5.  Patient will be able to complete stair navigation independently without UE support & significant increase in pain.  Baseline: need to assess Goal status:  INITIAL  6.  Pt will decrease worst knee pain as reported on NPRS by at least 4 points in order to demonstrate clinically significant reduction in knee pain.  Baseline: 10/10 Goal status: INITIAL   PLAN:  PT FREQUENCY: 1-2x/week  PT DURATION: 12 weeks  PLANNED INTERVENTIONS: [id number]- PT Re-evaluation, 97750- Physical Performance Testing, 97110-Therapeutic exercises, 97530- Therapeutic activity, [id number]- Neuromuscular re-education, 97535- Self Care, [id number]- Manual therapy, [id number]- Gait training, [phone]- Canalith repositioning, [id number]- Electrical stimulation (manual), [phone] (1-2 muscles), [id number] (3+ muscles)- Dry Needling, Patient/Family education,  Balance training, Stair training, Joint mobilization, Scar mobilization, Cryotherapy, and Moist heat  PLAN FOR NEXT SESSION:  - -Assess stair navigation -Establish HEP  -Review & progress HEP from previous PT if applicable -LE strengthening  - hip flexion, knee extension -isometrics for pain modulation as appropriate -manual therapy as appropriate  Chiquita Silvan, Student-PT 12/13/2023, 1:26 PM

## 2023-12-13 ENCOUNTER — Ambulatory Visit: Attending: Orthopedic Surgery | Admitting: Physical Therapy

## 2023-12-13 ENCOUNTER — Other Ambulatory Visit: Payer: Self-pay

## 2023-12-13 DIAGNOSIS — R269 Unspecified abnormalities of gait and mobility: Secondary | ICD-10-CM | POA: Diagnosis not present

## 2023-12-13 DIAGNOSIS — M6281 Muscle weakness (generalized): Secondary | ICD-10-CM | POA: Insufficient documentation

## 2023-12-13 DIAGNOSIS — R2681 Unsteadiness on feet: Secondary | ICD-10-CM | POA: Insufficient documentation

## 2023-12-13 DIAGNOSIS — G8929 Other chronic pain: Secondary | ICD-10-CM | POA: Insufficient documentation

## 2023-12-13 DIAGNOSIS — M25562 Pain in left knee: Secondary | ICD-10-CM | POA: Diagnosis not present

## 2023-12-14 ENCOUNTER — Encounter: Payer: Self-pay | Admitting: Family Medicine

## 2023-12-14 ENCOUNTER — Ambulatory Visit: Payer: PRIVATE HEALTH INSURANCE | Admitting: Family Medicine

## 2023-12-14 VITALS — BP 124/80 | HR 59 | Ht 64.0 in | Wt 158.1 lb

## 2023-12-14 DIAGNOSIS — F5101 Primary insomnia: Secondary | ICD-10-CM

## 2023-12-14 DIAGNOSIS — N183 Chronic kidney disease, stage 3 unspecified: Secondary | ICD-10-CM

## 2023-12-14 DIAGNOSIS — G4733 Obstructive sleep apnea (adult) (pediatric): Secondary | ICD-10-CM

## 2023-12-14 DIAGNOSIS — Z7985 Long-term (current) use of injectable non-insulin antidiabetic drugs: Secondary | ICD-10-CM | POA: Diagnosis not present

## 2023-12-14 DIAGNOSIS — E782 Mixed hyperlipidemia: Secondary | ICD-10-CM

## 2023-12-14 DIAGNOSIS — B351 Tinea unguium: Secondary | ICD-10-CM

## 2023-12-14 DIAGNOSIS — E039 Hypothyroidism, unspecified: Secondary | ICD-10-CM

## 2023-12-14 DIAGNOSIS — I129 Hypertensive chronic kidney disease with stage 1 through stage 4 chronic kidney disease, or unspecified chronic kidney disease: Secondary | ICD-10-CM | POA: Diagnosis not present

## 2023-12-14 DIAGNOSIS — E1169 Type 2 diabetes mellitus with other specified complication: Secondary | ICD-10-CM | POA: Diagnosis not present

## 2023-12-14 LAB — POCT GLYCOSYLATED HEMOGLOBIN (HGB A1C): Hemoglobin A1C: 5.7 % — AB (ref 4.0–5.6)

## 2023-12-14 MED ORDER — ZOLPIDEM TARTRATE ER 6.25 MG PO TBCR: 6.2500 mg | EXTENDED_RELEASE_TABLET | Freq: Every evening | ORAL | 5 refills | Status: AC | PRN

## 2023-12-14 MED ORDER — ROSUVASTATIN CALCIUM 5 MG PO TABS: 5.0000 mg | ORAL_TABLET | Freq: Every day | ORAL | 3 refills | Status: AC

## 2023-12-14 NOTE — Patient Instructions (Addendum)
 Thank you for coming to the office today.  Recent Labs    06/04/23 0847 12/14/23 0959  HGBA1C 5.7* 5.7*   Keep up the great work  Continue Mounjaro  15 every 10 days or switch to 10mg  in future.  Keep up with the PT for Knee  Finish the remaining 2 refills on the anti fungal Terbinafine  for toenail Send me a photo after the total 3 months.  Ambien  ordered for pickup 01/05/24 with additional months of refills  Please schedule a Follow-up Appointment to: Return in about 6 months (around 06/12/2024) for 6 month fasting lab > 1 week later Annual Physical.  If you have any other questions or concerns, please feel free to call the office or send a message through MyChart. You may also schedule an earlier appointment if necessary.  Additionally, you may be receiving a survey about your experience at our office within a few days to 1 week by e-mail or mail. We value your feedback.  Marsa Officer, DO [facility], [place]

## 2023-12-14 NOTE — Addendum Note (Signed)
 Addended by: Laurice Kimmons M on: 12/14/2023 09:01 AM   Modules accepted: Orders

## 2023-12-14 NOTE — Progress Notes (Signed)
 Subjective:    Patient ID: Kelsey Mendoza, female    DOB: Aug 09, 1950, 73 y.o.   MRN: [id number]  Kelsey Mendoza is a 73 y.o. female presenting on 12/14/2023 for Medical Management of Chronic Issues   HPI  Discussed the use of AI scribe software for clinical note transcription with the patient, who gave verbal consent to proceed.  History of Present Illness   [name] is a 73 year old female who presents for a six-month follow-up to review her A1c levels.  Type 2 Diabetes - Most recent hemoglobin A1c is 5.7%. - Desires further reduction in A1c. - Manages diabetes with Mounjaro , alternating between 10 mg and 15 mg doses. - Spaces 15 mg dose to every ten days to manage side effects.  Tendinitis and mobility - History of tendinitis impacting ambulation. - Currently undergoing physical therapy twice weekly through early November. - Condition has limited walking ability. - Optimistic that increased movement will assist with weight management.  Weight management - Last recorded weight is 160 lbs. - Cardiologist recommended additional 10 lb weight loss.  Onychomycosis (toenail fungus) - Initiated antifungal treatment at the end of July. - Completed one month of therapy; two refills remain. - Some improvement observed, but progress is slow.  Headache and allergic symptoms - Experiences low-grade headaches. - Manages symptoms with Tylenol  and Claritin . - Suspects seasonal allergies as a contributing factor. - Inquires about safety of concurrent Tylenol  and Claritin  use.  Medication management - Takes rosuvastatin  daily; requests refill. - Takes Ambien ; last refill on August 30th, next due September 27th.  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 - Recent potassium levels have remained stable.     OSA on CPAP - Patient reports prior history of dx OSA and on CPAP - Today reports that sleep apnea is well  controlled. She uses the CPAP machine every night. Tolerates the machine well, and thinks that sleeps better with it. She has benefited from using CPAP machine.      12/14/2023    9:53 AM 11/07/2023    4:26 PM 09/21/2023    2:04 PM  Depression screen PHQ 2/9  Decreased Interest 0 0 0  Down, Depressed, Hopeless 0 0 0  PHQ - 2 Score 0 0 0  Altered sleeping 3 3 0  Tired, decreased energy 1 1 0  Change in appetite  0 0  Feeling bad or failure about yourself  0 0 0  Trouble concentrating 0 0 0  Moving slowly or fidgety/restless 0 0 0  Suicidal thoughts 0 0 0  PHQ-9 Score 4 4 0  Difficult doing work/chores Not difficult at all Somewhat difficult Not difficult at all       12/14/2023    9:53 AM 11/07/2023    4:27 PM 06/11/2023    9:24 AM 12/12/2022    9:06 AM  GAD 7 : Generalized Anxiety Score  Nervous, Anxious, on Edge 0 0 0 0  Control/stop worrying 0 0 0 0  Worry too much - different things 0 0 0 0  Trouble relaxing 0 0 0 0  Restless 0 0 0 0  Easily annoyed or irritable 0 0 0 0  Afraid - awful might happen 0 0 0 0  Total GAD 7 Score 0 0 0 0  Anxiety Difficulty  Not difficult at all      Social History   Tobacco Use   Smoking status: Former    Current packs/day:  0.00    Types: Cigarettes    Quit date: 03/01/1983    Years since quitting: 40.8   Smokeless tobacco: Never  Vaping Use   Vaping status: Never Used  Substance Use Topics   Alcohol use: No   Drug use: No    Review of Systems  Constitutional:  Negative for activity change, appetite change, chills, diaphoresis, fatigue and fever.  HENT:  Negative for congestion and hearing loss.   Eyes:  Negative for visual disturbance.  Respiratory:  Negative for cough, chest tightness, shortness of breath and wheezing.   Cardiovascular:  Negative for chest pain, palpitations and leg swelling.  Gastrointestinal:  Negative for abdominal pain, constipation, diarrhea, nausea and vomiting.  Genitourinary:  Negative for dysuria,  frequency and hematuria.  Musculoskeletal:  Negative for arthralgias and neck pain.  Skin:  Negative for rash.  Neurological:  Negative for dizziness, weakness, light-headedness, numbness and headaches.  Hematological:  Negative for adenopathy.  Psychiatric/Behavioral:  Negative for behavioral problems, dysphoric mood and sleep disturbance.    Per HPI unless specifically indicated above     Objective:    BP 124/80 (BP Location: Left Arm, Patient Position: Sitting, Cuff Size: Normal)   Pulse (!) 59   Ht 5' 4 (1.626 m)   Wt 158 lb 2 oz (71.7 kg)   SpO2 95%   BMI 27.14 kg/m   Wt Readings from Last 3 Encounters:  12/14/23 158 lb 2 oz (71.7 kg)  11/07/23 162 lb 8 oz (73.7 kg)  09/05/23 161 lb 6.4 oz (73.2 kg)    Physical Exam Vitals and nursing note reviewed.  Constitutional:      General: She is not in acute distress.    Appearance: She is well-developed. She is not diaphoretic.     Comments: Well-appearing, comfortable, cooperative  HENT:     Head: Normocephalic and atraumatic.  Eyes:     General:        Right eye: No discharge.        Left eye: No discharge.     Conjunctiva/sclera: Conjunctivae normal.     Pupils: Pupils are equal, round, and reactive to light.  Neck:     Thyroid : No thyromegaly.  Cardiovascular:     Rate and Rhythm: Normal rate and regular rhythm.     Pulses: Normal pulses.     Heart sounds: Normal heart sounds. No murmur heard. Pulmonary:     Effort: Pulmonary effort is normal. No respiratory distress.     Breath sounds: Normal breath sounds. No wheezing or rales.  Abdominal:     General: Bowel sounds are normal. There is no distension.     Palpations: Abdomen is soft. There is no mass.     Tenderness: There is no abdominal tenderness.  Musculoskeletal:        General: No tenderness. Normal range of motion.     Cervical back: Normal range of motion and neck supple.     Comments: Upper / Lower Extremities: - Normal muscle tone, strength  bilateral upper extremities 5/5, lower extremities 5/5  Lymphadenopathy:     Cervical: No cervical adenopathy.  Skin:    General: Skin is warm and dry.     Findings: No erythema or rash.  Neurological:     Mental Status: She is alert and oriented to person, place, and time.     Comments: Distal sensation intact to light touch all extremities  Psychiatric:        Mood and Affect: Mood normal.  Behavior: Behavior normal.        Thought Content: Thought content normal.     Comments: Well groomed, good eye contact, normal speech and thoughts     Diabetic Foot Exam - Simple   Simple Foot Form Diabetic Foot exam was performed with the following findings: Yes 12/14/2023 10:13 AM  Visual Inspection See comments: Yes Sensation Testing Intact to touch and monofilament testing bilaterally: Yes Pulse Check Posterior Tibialis and Dorsalis pulse intact bilaterally: Yes Comments Left great toenail with thickened onychomycosis improving. No ulceration or callus or neuropathy      Results for orders placed or performed in visit on 12/14/23  POCT HgB A1C   Collection Time: 12/14/23  9:59 AM  Result Value Ref Range   Hemoglobin A1C 5.7 (A) 4.0 - 5.6 %   HbA1c POC (<> result, manual entry)     HbA1c, POC (prediabetic range)     HbA1c, POC (controlled diabetic range)        Assessment & Plan:   Problem List Items Addressed This Visit     Benign hypertension with CKD (chronic kidney disease) stage III (HCC)   Relevant Medications   rosuvastatin  (CRESTOR ) 5 MG tablet   OSA on CPAP   Type 2 diabetes mellitus with other specified complication (HCC) - Primary   Relevant Medications   rosuvastatin  (CRESTOR ) 5 MG tablet   Other Relevant Orders   POCT HgB A1C (Completed)   Other Visit Diagnoses       Primary insomnia       Relevant Medications   zolpidem  (AMBIEN  CR) 6.25 MG CR tablet (Start on 01/05/2024)     Onychomycosis of left great toe         Acquired hypothyroidism          Mixed hyperlipidemia       Relevant Medications   rosuvastatin  (CRESTOR ) 5 MG tablet     Long-term current use of injectable noninsulin antidiabetic medication            Knee tendinitis Knee tendinitis affecting mobility. Physical therapy expected to improve symptoms. - Continue physical therapy until the beginning of November.  Type 2 diabetes mellitus, well-controlled A1c 5.7, indicating well-controlled diabetes. Mounjaro  15 mg every ten days effective in maintaining blood sugar levels. Considering switch to 10 mg. - Continue Mounjaro  15 mg every ten days or switch to 10 mg as needed. - Monitor blood sugar levels biannually.  Mixed hyperlipidemia On rosuvastatin  for cholesterol management. Discrepancy in prescription history, but taking it daily. - Renew rosuvastatin  prescription for daily use.  Insomnia Requires Ambien  for insomnia management. - Order Ambien  for pickup on September 27th with additional refills.  Tinea unguium (toenail fungus) Toenail fungus improving with normal growth at nail base. On treatment since end of July, continuing for three months total. - Finish remaining two refills of antifungal medication. - Send photo of toenail after completing three months of treatment. - Consider checking liver enzymes if treatment extends beyond three months.  Hypokalemia Potassium levels stable in last two tests. Licorice identified as potential cause of previous hypokalemia. - Monitor potassium levels as needed.  General Health Maintenance Due for pneumonia vaccination, last received four years ago. Hesitant about vaccinations but reassured about safety and efficacy. - Consider pneumonia vaccination in the future.        Orders Placed This Encounter  Procedures   POCT HgB A1C    Meds ordered this encounter  Medications   zolpidem  (AMBIEN  CR) 6.25 MG CR  tablet    Sig: Take 1 tablet (6.25 mg total) by mouth at bedtime as needed. for sleep    Dispense:  30  tablet    Refill:  5    First fill 01/05/24   rosuvastatin  (CRESTOR ) 5 MG tablet    Sig: Take 1 tablet (5 mg total) by mouth daily.    Dispense:  90 tablet    Refill:  3    Follow up plan: Return in about 6 months (around 06/12/2024) for 6 month fasting lab > 1 week later Annual Physical.  Future labs ordered for 06/2024   Marsa Officer, DO [facility] 12/14/2023, 10:04 AM

## 2023-12-15 ENCOUNTER — Other Ambulatory Visit: Payer: Self-pay | Admitting: Family Medicine

## 2023-12-15 DIAGNOSIS — E538 Deficiency of other specified B group vitamins: Secondary | ICD-10-CM

## 2023-12-15 DIAGNOSIS — Z0000 Encounter for general adult medical examination without abnormal findings: Secondary | ICD-10-CM

## 2023-12-15 DIAGNOSIS — E782 Mixed hyperlipidemia: Secondary | ICD-10-CM

## 2023-12-15 DIAGNOSIS — N183 Chronic kidney disease, stage 3 unspecified: Secondary | ICD-10-CM

## 2023-12-15 DIAGNOSIS — F5101 Primary insomnia: Secondary | ICD-10-CM

## 2023-12-15 DIAGNOSIS — E1169 Type 2 diabetes mellitus with other specified complication: Secondary | ICD-10-CM

## 2023-12-15 DIAGNOSIS — E039 Hypothyroidism, unspecified: Secondary | ICD-10-CM

## 2023-12-17 ENCOUNTER — Ambulatory Visit

## 2023-12-17 DIAGNOSIS — M6281 Muscle weakness (generalized): Secondary | ICD-10-CM

## 2023-12-17 DIAGNOSIS — G8929 Other chronic pain: Secondary | ICD-10-CM

## 2023-12-17 DIAGNOSIS — M25562 Pain in left knee: Secondary | ICD-10-CM | POA: Diagnosis not present

## 2023-12-17 DIAGNOSIS — R2681 Unsteadiness on feet: Secondary | ICD-10-CM

## 2023-12-17 DIAGNOSIS — R269 Unspecified abnormalities of gait and mobility: Secondary | ICD-10-CM

## 2023-12-17 NOTE — Therapy (Signed)
 OUTPATIENT PHYSICAL THERAPY LOWER EXTREMITY TREATMENT   Patient Name: Kelsey Mendoza MRN: [id number] DOB:[birth year], 73 y.o., female Today's Date: 12/17/2023  END OF SESSION:    PT End of Session - 12/17/23 1319     Visit Number 2    Number of Visits 24    Date for PT Re-Evaluation 03/06/24    Progress Note Due on Visit 10    PT Start Time 1315    PT Stop Time 1404    PT Time Calculation (min) 49 min    Equipment Utilized During Treatment Gait belt    Activity Tolerance Patient tolerated treatment well;No increased pain    Behavior During Therapy WFL for tasks assessed/performed          Past Medical History:  Diagnosis Date   Bladder cancer (HCC) 2000   resection x 3 and BCG therapy   CAD (coronary artery disease)    Chronic kidney disease, stage 3 (HCC)    Diastolic dysfunction    Diverticulosis    Fatigue    History of bilateral cataract extraction    HLD (hyperlipidemia)    HTN (hypertension)    Insomnia    Long-term current use of immunosuppressive biologic agent    a.) deucravacitinib   Obstructive sleep apnea with dependence on continuous positive airway pressure (CPAP)    Pericardial effusion    Primary osteoarthritis of left knee    Psoriasis of scalp    a.) Tx'd with deucravacitinib   Type 2 diabetes mellitus (HCC)    Past Surgical History:  Procedure Laterality Date   BLADDER SURGERY     x 3 resection bladder cancer   BROW LIFT Bilateral 05/27/2021   Procedure: BLEPHAROPLASTY UPPER EYELID; W/EXCESS SKIN AND BROW PTOSIS REPAIR BILATERAL;  Surgeon: Ashley Greig HERO, MD;  Location: [facility];  Service: Ophthalmology;  Laterality: Bilateral;   CARPAL TUNNEL RELEASE Right 06/06/2017   Procedure: CARPAL TUNNEL RELEASE;  Surgeon: Marchia Drivers, MD;  Location: [facility];  Service: Orthopedics;  Laterality: Right;   CATARACT EXTRACTION W/ INTRAOCULAR LENS  IMPLANT, BILATERAL Bilateral 08/2019   COLONOSCOPY  07/16/2012   EYE SURGERY Left 11/26/2019    Dr. Elner, Vit, Mem Benedetto   I & D EXTREMITY Right 06/06/2017   Procedure: IRRIGATION AND DEBRIDEMENT EXTREMITY--RIGHT ARM;  Surgeon: Marchia Drivers, MD;  Location: [facility];  Service: Orthopedics;  Laterality: Right;   KNEE ARTHROSCOPY Left 10/06/2019   Procedure: ARTHROSCOPY KNEE,PARTIAL MEDIAL MENISECTOMY,PARTIAL CHONDROPLASTY;  Surgeon: Mardee Lynwood SQUIBB, MD;  Location: [facility];  Service: Orthopedics;  Laterality: Left;   LUMBAR SPINE SURGERY     MEDIAL PARTIAL KNEE REPLACEMENT Right 12/29/2016   Makoplasty   OPEN REDUCTION INTERNAL FIXATION (ORIF) DISTAL RADIAL FRACTURE N/A 06/06/2017   Procedure: OPEN REDUCTION INTERNAL FIXATION (ORIF) DISTAL RADIAL FRACTURE;  Surgeon: Marchia Drivers, MD;  Location: [facility];  Service: Orthopedics;  Laterality: N/A;   TOTAL KNEE ARTHROPLASTY Left 07/16/2023   Procedure: ARTHROPLASTY, KNEE, TOTAL;  Surgeon: Lorelle Hussar, MD;  Location: [facility];  Service: Orthopedics;  Laterality: Left;   VAGINAL HYSTERECTOMY     VITRECTOMY Bilateral    Patient Active Problem List   Diagnosis Date Noted   S/P TKR (total knee replacement), left 07/16/2023   Primary osteoarthritis of left knee 06/11/2023   Elevated TSH 04/18/2022   Benign hypertension with CKD (chronic kidney disease) stage III (HCC) 01/17/2022   Hyperlipidemia associated with type 2 diabetes mellitus (HCC) 01/17/2022   OSA on CPAP 06/13/2021   Posterior  capsular opacification, right 03/25/2020   Postoperative follow-up 03/18/2020   Left posterior capsular opacification 02/12/2020   Left epiretinal membrane 11/10/2019   Right epiretinal membrane 11/10/2019   Cystoid macular edema of left eye 11/10/2019   Glaucoma suspect, bilateral 11/10/2019   Cancer (HCC) 10/04/2019   Closed Colles' fracture 07/03/2017   Distal radius fracture, right 06/05/2017   Contusion of left knee 11/27/2016   Contusion of right knee 11/27/2016   Depression 03/26/2014   Depressed affect 09/25/2013   Difficulty  sleeping 09/25/2013   Type 2 diabetes mellitus with other specified complication (HCC) 09/03/2013   Diverticular disease 07/17/2012   Diverticulosis of colon 07/17/2012   Arthritis 07/14/2012   Bladder cancer (HCC) 07/14/2012    PCP: Marsa Officer, DO  REFERRING PROVIDER: Arthea Sheer, MD  REFERRING DIAG: M70.52 (ICD-10-CM) - Other bursitis of knee, left knee   THERAPY DIAG:   Chronic pain of left knee  Muscle weakness (generalized)  Unsteadiness on feet  Abnormality of gait  Rationale for Evaluation and Treatment: Rehabilitation  ONSET DATE: L TKA 07/16/23; knee pain noted at physician visit 10/26/23.   SUBJECTIVE:   SUBJECTIVE STATEMENT:  From Today: Patient reports ongoing anteriolateral and medial Left knee joint pain. States she is active and has difficulty resting. Rates pain at a 1/10  From EVAL:  Pt stated that she had R knee replacement a couple of years ago without complications, healed well.  Following most recent L knee surgery, pt reported that she saw PT for several weeks. Pt reported continuing to do HEP provided by previous PT to include following exercises:  Heel raises Heel slides Squats Pt educated to make list/bring in handout of exercises that she was previously given.   Pt stated that she was told she is experiencing tendonitis. Pt states that L knee pain is sharp, along lateral side of knee near Gerdy's tubercle, arc under knee. Pt stated that pain is tolerable during the day, but it wakes her up at night; usually not able to go back to sleep. Pt stated that her pain gets worse as the day goes on & is dependent on level of activity. When pain is at it's worst, it takes 5-10 min. to calm down to a more tolerable level.   Pt reports that her pain is exacerbated by movement throughout the day, as well as moving the wrong way. Pt stated that she tries to avoid twisting at the knee.  Pt reports that ice helps, tylenol  helps to a degree, &  celebrex  doesn't do much.   Pt reports most difficulty getting in & out of the car; Pt reported that getting up from chairs can be difficult; pain limits her from walking & being more active, would like to be able to walk dog.  Pt states that she does have goats, uses walking sticks for balance when outdoors to tend to them.     PERTINENT HISTORY: PMH: DM, HTN, CKD, hyperlipidemia, obesity, OA, OSA on CPAP, hx of bladder cancer, insomnia   Per ortho MD note 10/26/23: Kelsey Mendoza is a 73 y.o. female who presents for follow-up 39-month status post left total knee replacement. Patient reports she was doing fantastic improving with pain and function and then she increased going to the gym and other activities. She reports over the last few weeks she developed a new sharp pain over the proximal medial tibia which wraps around posteriorly behind her knee and runs approximately. She reports with certain twisting motions and get  up to an 8 out of 10 at its worst. She reports minimal improvement with Voltaren or pain medications. She denies any trauma to the knee. Denies any fevers chills systemic symptoms or concerning signs for infection...   The patient had been doing fantastic after her left total knee replacement. She has developed some pes anserine bursitis and hamstring tendinitis after increasing her activities. There is no instability or increased motion in flexion on her exam. We discussed treatment options for the tendinitis including topical medications, local steroid injections, bracing, physical therapy and home exercises. Patient would like to try some anti-inflammatories and a local injection and do some physical therapy for the knee. Will also give her a brace to help calm down the inflammation. We will see how she does with these interventions and have her follow-up in a few months for repeat evaluation. All questions answered she agrees with above plan.    Per ortho MD note 11/28/23:  Kelsey Mendoza is a 73 y.o. female who presents for follow-up 4 months to post left total knee replacement. We saw her 1 month ago and she was having great improvement in pain and function but developed some tenderness consistent with tendinitis around the medial and lateral aspect of her knee. We did a pes anserine bursa injection and she got minimal short-term relief from it and reports ongoing pain in the same distributions. She denies any frank instability in the knee but does have sensations of pain which make it feel like it might want a give out. She has not had any falls. The patient denies fevers, chills, shortness of breath, chest pain, recent illness, or any trauma.   PAIN:  Are you having pain? Yes: NPRS scale: 10/10 at worst, 1/10 at best Pain location: lateral knee; arc pattern below patella Pain description: sharp Aggravating factors: movement Relieving factors: ice, tylenol   PRECAUTIONS: None  RED FLAGS: None   WEIGHT BEARING RESTRICTIONS: No  FALLS:  Has patient fallen in last 6 months? No  LIVING ENVIRONMENT: Lives with: lives alone Lives in: House/apartment Stairs: No Has following equipment at home: Vannie - 2 wheeled, Tour manager, Grab bars, and raised commode  OCCUPATION: retired Lexicographer, former LPN  PLOF: Independent  PATIENT GOALS: reduce knee pain; get stronger; would like to be more active, be able to walk dog   NEXT MD VISIT: follow-up w/ ortho in ~1 mo.   OBJECTIVE:  Note: Objective measures were completed at Evaluation unless otherwise noted.  DIAGNOSTIC FINDINGS: Per ortho MD note 11/28/23: 3 view x-rays AP, lateral, sunrise of the left knee ordered and taken today in clinic and images reviewed by myself show status post total knee arthroplasty with components in appropriate position. Patella tracking midline. No evidence of periprosthetic loosening or fracture.    PATIENT SURVEYS:  LEFS: Extreme difficulty/unable (0), Quite  a bit of difficulty (1), Moderate difficulty (2), Little difficulty (3), No difficulty (4) Survey date:   12/13/23  Any of your usual work, housework or school activities 3  2. Usual hobbies, recreational or sporting activities 4  3. Getting into/out of the bath 4  4. Walking between rooms 3  5. Putting on socks/shoes 3  6. Squatting  3  7. Lifting an object, like a bag of groceries from the floor 3  8. Performing light activities around your home 4  9. Performing heavy activities around your home 1  10. Getting into/out of a car 2  11. Walking 2 blocks 2  12.  Walking 1 mile 1  13. Going up/down 10 stairs (1 flight) 2  14. Standing for 1 hour 1  15.  sitting for 1 hour 3  16. Running on even ground 0  17. Running on uneven ground 0  18. Making sharp turns while running fast 0  19. Hopping  0  20. Rolling over in bed 1  Score total:  40/80     COGNITION: Overall cognitive status: Within functional limits for tasks assessed     SENSATION: Light touch: WFL Pt reports some numbness noted along L lateral knee, but able to feel light touch.   EDEMA:  Mild swelling noted in L knee consistent with s/p TKA  PALPATION: tenderness at L lateral knee near Gerdy's tubercle, pt described as burning sensation   LOWER EXTREMITY ROM:  Active ROM Right eval Left eval  Hip flexion    Hip extension    Hip abduction    Hip adduction    Hip internal rotation    Hip external rotation    Knee flexion [facility] WFL; decreased compared to R  Knee extension [facility]  Ankle dorsiflexion    Ankle plantarflexion    Ankle inversion    Ankle eversion     (Blank rows = not tested)  LOWER EXTREMITY MMT:  MMT Right eval Left eval  Hip flexion 4- 3+*  Hip extension    Hip abduction 4+ 4+  Hip adduction 4+ 4+  Hip internal rotation    Hip external rotation    Knee flexion 4+ 4-  Knee extension 4+ 4-  Ankle dorsiflexion 4- 4-  Ankle plantarflexion 5 5  Ankle inversion    Ankle eversion      (Blank rows = not tested) *=pain reported by pt   FUNCTIONAL TESTS:  5 times sit to stand: 18.19 sec Timed up and go (TUG): 10.23 sec 10 meter walk test: average normal walking speed: 1.11 m/s; average fast walking speed: 1.21 m/s  GAIT: Assistive device utilized: None Level of assistance: Complete Independence Comments: antalgic gait noted; L knee buckling with increased gait speed during                                                                                                                                TREATMENT DATE: 12/17/2023   6 Min Walk Test:  Instructed patient to ambulate as quickly and as safely as possible for 6 minutes using LRAD. Patient was allowed to take standing rest breaks without stopping the test, but if the patient required a sitting rest break the clock would be stopped and the test would be over.  Results: 1260 feet (366 meters, Avg speed 1.0 m/s) using no device with SBA. Results indicate that the patient has reduced endurance with ambulation compared to age matched norms.  Age Matched Norms: 50-69 yo M: 36 F: 73, 49-74 yo M: 54 F: 471, 74-89 yo M: 417 F: 392 MDC:  58.21 meters (190.98 feet) or 50 meters (ANPTA Core Set of Outcome Measures for Adults with Neurologic Conditions, 2018)   Assessment of stairs:  Patient able to demonstrate going up/down 4 steps today without UE support and step over step with no report of pain going up or down today.   THEREX:  Supine quad sets with 5 sec hold x12 reps LLE. Supine ham sets with 5 sec hold x 12 reps LLE. Supine hip add sets (pillow squeeze)  with 5 sec hold x 12 reps LLE Attempted Hip add squeeze with SAQ yet increased pain after 3 reps so ceased.   Manual therapy:  STM to left medial, anterior, and anteriolateral knee joint x several min Gentle cross friction scar massage x 2-3 min Grade 2-3 PA/AP tibiofemoral glides  for ROM and tissue extensibility x several min.  Ice cup massage to left  medial/anterior and lateral knee joint line x 5 min   PATIENT EDUCATION:  Education details: POC as related to future sessions, implementation of HEP at next session Person educated: Patient Education method: Explanation Education comprehension: verbalized understanding and needs further education  HOME EXERCISE PROGRAM: Establish at future visit  ASSESSMENT:  CLINICAL IMPRESSION: Patient is a 73 y.o. female who was seen today for physical therapy  treatment for L knee pain s/p TKA 07/16/23.  Patient does present with ongong left knee pain and decreased 6 min walk test results when compared to sex/age based norms. She had no difficulty with stairs without railing and step over step. Initiated some pain free quad/ham strengthening and educated in use of ice cup massage for more localized pain limitation. She reported feeling okay after manual therapy and ice cup massage.  The pt will benefit from further skilled PT to improve these deficits in order to increase QOL and ease/safety with ADLs.  Pt with bilat LE weakness, most notably L hip weakness; noted L knee buckling with increased gait speed. Pt reported increased pain with gait at increased distances, limiting her ability to increase activity.   OBJECTIVE IMPAIRMENTS: Abnormal gait, decreased activity tolerance, decreased balance, decreased knowledge of condition, decreased mobility, difficulty walking, decreased ROM, decreased strength, increased edema, increased fascial restrictions, and pain.   ACTIVITY LIMITATIONS: lifting, bending, sitting, standing, squatting, sleeping, stairs, transfers, bed mobility, dressing, and locomotion level  PARTICIPATION LIMITATIONS: cleaning, laundry, and community activity  PERSONAL FACTORS: Age, Fitness, Sex, Time since onset of injury/illness/exacerbation, and 3+ comorbidities: DM, HTN, CKD, hyperlipidemia, obesity, OA, OSA on CPAP, hx of bladder cancer, insomnia are also affecting patient's functional  outcome.   REHAB POTENTIAL: Good  CLINICAL DECISION MAKING: Stable/uncomplicated  EVALUATION COMPLEXITY: Low   GOALS: Goals reviewed with patient? Yes  SHORT TERM GOALS: Target date: 01/25/24 Patient will be independent with home exercise program to improve strength/mobility for increased functional independence with ADLs and mobility. Baseline: to be established at future visit  Goal status: INITIAL  LONG TERM GOALS: Target date: 03/07/24   Patient will complete five times sit to stand test (5XSTS) in 12.8 seconds indicating an increased LE strength and improved balance & match age-based norms. Baseline: 18.19 sec Goal status: INITIAL  2.  Patient will increase 10 meter walk test to >1.84m/s without antalgic gait or knee buckling as to improve gait speed for better community ambulation and to reduce fall risk. Baseline: normal: 1.11 m/s with antalgic gait; fast: 1.21 m/s with antalgic gait, knee buckling Goal status: INITIAL  3.  Patient will increase lower extremity functional scale to >60/80  to demonstrate improved functional mobility and increased tolerance with ADLs.  Baseline: 40/80 Goal status: INITIAL  4.  Patient will increase six minute walk test distance to >1051ft for progression to community level ambulation, demonstrating improved gait endurance.  Baseline: need to assess Goal status: INITIAL  5.  Patient will be able to complete stair navigation independently without UE support & significant increase in pain.  Baseline: need to assess Goal status: INITIAL  6.  Pt will decrease worst knee pain as reported on NPRS by at least 4 points in order to demonstrate clinically significant reduction in knee pain.  Baseline: 10/10 Goal status: INITIAL   PLAN:  PT FREQUENCY: 1-2x/week  PT DURATION: 12 weeks  PLANNED INTERVENTIONS: [id number]- PT Re-evaluation, 97750- Physical Performance Testing, 97110-Therapeutic exercises, 97530- Therapeutic activity, [id number]-  Neuromuscular re-education, 97535- Self Care, [id number]- Manual therapy, [id number]- Gait training, [phone]- Canalith repositioning, [id number]- Electrical stimulation (manual), [phone] (1-2 muscles), [id number] (3+ muscles)- Dry Needling, Patient/Family education, Balance training, Stair training, Joint mobilization, Scar mobilization, Cryotherapy, and Moist heat  PLAN FOR NEXT SESSION:  -Establish HEP  -Review & progress HEP from previous PT if applicable -LE strengthening  - hip flexion, knee extension -isometrics for pain modulation as appropriate -manual therapy as appropriate  Reyes LOISE London, PT 12/17/2023, 2:30 PM

## 2023-12-19 ENCOUNTER — Encounter: Admitting: Physical Therapy

## 2023-12-20 ENCOUNTER — Encounter: Admitting: Physical Therapy

## 2023-12-20 ENCOUNTER — Ambulatory Visit

## 2023-12-20 DIAGNOSIS — M25562 Pain in left knee: Secondary | ICD-10-CM | POA: Diagnosis not present

## 2023-12-20 DIAGNOSIS — M6281 Muscle weakness (generalized): Secondary | ICD-10-CM

## 2023-12-20 DIAGNOSIS — R2681 Unsteadiness on feet: Secondary | ICD-10-CM

## 2023-12-20 DIAGNOSIS — R269 Unspecified abnormalities of gait and mobility: Secondary | ICD-10-CM

## 2023-12-20 DIAGNOSIS — G8929 Other chronic pain: Secondary | ICD-10-CM

## 2023-12-20 NOTE — Therapy (Signed)
 OUTPATIENT PHYSICAL THERAPY LOWER EXTREMITY TREATMENT   Patient Name: Kelsey Mendoza MRN: [id number] DOB:04-24-1950, 73 y.o., female Today's Date: 12/20/2023  END OF SESSION:    PT End of Session - 12/20/23 0824     Visit Number 3    Number of Visits 24    Date for PT Re-Evaluation 03/06/24    Progress Note Due on Visit 10    PT Start Time 0825    PT Stop Time 0910    PT Time Calculation (min) 45 min    Equipment Utilized During Treatment Gait belt    Activity Tolerance Patient tolerated treatment well;No increased pain    Behavior During Therapy WFL for tasks assessed/performed          Past Medical History:  Diagnosis Date   Bladder cancer (HCC) 2000   resection x 3 and BCG therapy   CAD (coronary artery disease)    Chronic kidney disease, stage 3 (HCC)    Diastolic dysfunction    Diverticulosis    Fatigue    History of bilateral cataract extraction    HLD (hyperlipidemia)    HTN (hypertension)    Insomnia    Long-term current use of immunosuppressive biologic agent    a.) deucravacitinib   Obstructive sleep apnea with dependence on continuous positive airway pressure (CPAP)    Pericardial effusion    Primary osteoarthritis of left knee    Psoriasis of scalp    a.) Tx'd with deucravacitinib   Type 2 diabetes mellitus (HCC)    Past Surgical History:  Procedure Laterality Date   BLADDER SURGERY     x 3 resection bladder cancer   BROW LIFT Bilateral 05/27/2021   Procedure: BLEPHAROPLASTY UPPER EYELID; W/EXCESS SKIN AND BROW PTOSIS REPAIR BILATERAL;  Surgeon: Ashley Greig HERO, MD;  Location: [facility];  Service: Ophthalmology;  Laterality: Bilateral;   CARPAL TUNNEL RELEASE Right 06/06/2017   Procedure: CARPAL TUNNEL RELEASE;  Surgeon: Marchia Drivers, MD;  Location: [facility];  Service: Orthopedics;  Laterality: Right;   CATARACT EXTRACTION W/ INTRAOCULAR LENS  IMPLANT, BILATERAL Bilateral 08/2019   COLONOSCOPY  07/16/2012   EYE SURGERY Left  11/26/2019   Dr. Elner, Vit, Mem Benedetto   I & D EXTREMITY Right 06/06/2017   Procedure: IRRIGATION AND DEBRIDEMENT EXTREMITY--RIGHT ARM;  Surgeon: Marchia Drivers, MD;  Location: [facility];  Service: Orthopedics;  Laterality: Right;   KNEE ARTHROSCOPY Left 10/06/2019   Procedure: ARTHROSCOPY KNEE,PARTIAL MEDIAL MENISECTOMY,PARTIAL CHONDROPLASTY;  Surgeon: Mardee Lynwood SQUIBB, MD;  Location: [facility];  Service: Orthopedics;  Laterality: Left;   LUMBAR SPINE SURGERY     MEDIAL PARTIAL KNEE REPLACEMENT Right 12/29/2016   Makoplasty   OPEN REDUCTION INTERNAL FIXATION (ORIF) DISTAL RADIAL FRACTURE N/A 06/06/2017   Procedure: OPEN REDUCTION INTERNAL FIXATION (ORIF) DISTAL RADIAL FRACTURE;  Surgeon: Marchia Drivers, MD;  Location: [facility];  Service: Orthopedics;  Laterality: N/A;   TOTAL KNEE ARTHROPLASTY Left 07/16/2023   Procedure: ARTHROPLASTY, KNEE, TOTAL;  Surgeon: Lorelle Hussar, MD;  Location: [facility];  Service: Orthopedics;  Laterality: Left;   VAGINAL HYSTERECTOMY     VITRECTOMY Bilateral    Patient Active Problem List   Diagnosis Date Noted   S/P TKR (total knee replacement), left 07/16/2023   Primary osteoarthritis of left knee 06/11/2023   Elevated TSH 04/18/2022   Benign hypertension with CKD (chronic kidney disease) stage III (HCC) 01/17/2022   Hyperlipidemia associated with type 2 diabetes mellitus (HCC) 01/17/2022   OSA on CPAP 06/13/2021   Posterior  capsular opacification, right 03/25/2020   Postoperative follow-up 03/18/2020   Left posterior capsular opacification 02/12/2020   Left epiretinal membrane 11/10/2019   Right epiretinal membrane 11/10/2019   Cystoid macular edema of left eye 11/10/2019   Glaucoma suspect, bilateral 11/10/2019   Cancer (HCC) 10/04/2019   Closed Colles' fracture 07/03/2017   Distal radius fracture, right 06/05/2017   Contusion of left knee 11/27/2016   Contusion of right knee 11/27/2016   Depression 03/26/2014   Depressed affect 09/25/2013    Difficulty sleeping 09/25/2013   Type 2 diabetes mellitus with other specified complication (HCC) 09/03/2013   Diverticular disease 07/17/2012   Diverticulosis of colon 07/17/2012   Arthritis 07/14/2012   Bladder cancer (HCC) 07/14/2012    PCP: Marsa Officer, DO  REFERRING PROVIDER: Arthea Sheer, MD  REFERRING DIAG: M70.52 (ICD-10-CM) - Other bursitis of knee, left knee   THERAPY DIAG:   Chronic pain of left knee  Muscle weakness (generalized)  Unsteadiness on feet  Abnormality of gait  Rationale for Evaluation and Treatment: Rehabilitation  ONSET DATE: L TKA 07/16/23; knee pain noted at physician visit 10/26/23.   SUBJECTIVE:   SUBJECTIVE STATEMENT:  Pt reports medial knee pain in the L knee.  Pt states it's at about 3/10 pain.    From EVAL:  Pt stated that she had R knee replacement a couple of years ago without complications, healed well.  Following most recent L knee surgery, pt reported that she saw PT for several weeks. Pt reported continuing to do HEP provided by previous PT to include following exercises:  Heel raises Heel slides Squats Pt educated to make list/bring in handout of exercises that she was previously given.   Pt stated that she was told she is experiencing tendonitis. Pt states that L knee pain is sharp, along lateral side of knee near Gerdy's tubercle, arc under knee. Pt stated that pain is tolerable during the day, but it wakes her up at night; usually not able to go back to sleep. Pt stated that her pain gets worse as the day goes on & is dependent on level of activity. When pain is at it's worst, it takes 5-10 min. to calm down to a more tolerable level.   Pt reports that her pain is exacerbated by movement throughout the day, as well as moving the wrong way. Pt stated that she tries to avoid twisting at the knee.  Pt reports that ice helps, tylenol  helps to a degree, & celebrex  doesn't do much.   Pt reports most difficulty  getting in & out of the car; Pt reported that getting up from chairs can be difficult; pain limits her from walking & being more active, would like to be able to walk dog.  Pt states that she does have goats, uses walking sticks for balance when outdoors to tend to them.     PERTINENT HISTORY: PMH: DM, HTN, CKD, hyperlipidemia, obesity, OA, OSA on CPAP, hx of bladder cancer, insomnia   Per ortho MD note 10/26/23: NALIAH EDDINGTON is a 73 y.o. female who presents for follow-up 61-month status post left total knee replacement. Patient reports she was doing fantastic improving with pain and function and then she increased going to the gym and other activities. She reports over the last few weeks she developed a new sharp pain over the proximal medial tibia which wraps around posteriorly behind her knee and runs approximately. She reports with certain twisting motions and get up to an 8 out of  10 at its worst. She reports minimal improvement with Voltaren or pain medications. She denies any trauma to the knee. Denies any fevers chills systemic symptoms or concerning signs for infection...   The patient had been doing fantastic after her left total knee replacement. She has developed some pes anserine bursitis and hamstring tendinitis after increasing her activities. There is no instability or increased motion in flexion on her exam. We discussed treatment options for the tendinitis including topical medications, local steroid injections, bracing, physical therapy and home exercises. Patient would like to try some anti-inflammatories and a local injection and do some physical therapy for the knee. Will also give her a brace to help calm down the inflammation. We will see how she does with these interventions and have her follow-up in a few months for repeat evaluation. All questions answered she agrees with above plan.    Per ortho MD note 11/28/23: DON GIARRUSSO is a 73 y.o. female who presents for  follow-up 4 months to post left total knee replacement. We saw her 1 month ago and she was having great improvement in pain and function but developed some tenderness consistent with tendinitis around the medial and lateral aspect of her knee. We did a pes anserine bursa injection and she got minimal short-term relief from it and reports ongoing pain in the same distributions. She denies any frank instability in the knee but does have sensations of pain which make it feel like it might want a give out. She has not had any falls. The patient denies fevers, chills, shortness of breath, chest pain, recent illness, or any trauma.   PAIN:  Are you having pain? Yes: NPRS scale: 10/10 at worst, 1/10 at best Pain location: lateral knee; arc pattern below patella Pain description: sharp Aggravating factors: movement Relieving factors: ice, tylenol   PRECAUTIONS: None  RED FLAGS: None   WEIGHT BEARING RESTRICTIONS: No  FALLS:  Has patient fallen in last 6 months? No  LIVING ENVIRONMENT: Lives with: lives alone Lives in: House/apartment Stairs: No Has following equipment at home: Vannie - 2 wheeled, Tour manager, Grab bars, and raised commode  OCCUPATION: retired Lexicographer, former LPN  PLOF: Independent  PATIENT GOALS: reduce knee pain; get stronger; would like to be more active, be able to walk dog   NEXT MD VISIT: follow-up w/ ortho in ~1 mo.   OBJECTIVE:  Note: Objective measures were completed at Evaluation unless otherwise noted.  DIAGNOSTIC FINDINGS: Per ortho MD note 11/28/23: 3 view x-rays AP, lateral, sunrise of the left knee ordered and taken today in clinic and images reviewed by myself show status post total knee arthroplasty with components in appropriate position. Patella tracking midline. No evidence of periprosthetic loosening or fracture.    PATIENT SURVEYS:  LEFS: Extreme difficulty/unable (0), Quite a bit of difficulty (1), Moderate difficulty (2), Little  difficulty (3), No difficulty (4) Survey date:   12/13/23  Any of your usual work, housework or school activities 3  2. Usual hobbies, recreational or sporting activities 4  3. Getting into/out of the bath 4  4. Walking between rooms 3  5. Putting on socks/shoes 3  6. Squatting  3  7. Lifting an object, like a bag of groceries from the floor 3  8. Performing light activities around your home 4  9. Performing heavy activities around your home 1  10. Getting into/out of a car 2  11. Walking 2 blocks 2  12. Walking 1 mile 1  13.  Going up/down 10 stairs (1 flight) 2  14. Standing for 1 hour 1  15.  sitting for 1 hour 3  16. Running on even ground 0  17. Running on uneven ground 0  18. Making sharp turns while running fast 0  19. Hopping  0  20. Rolling over in bed 1  Score total:  40/80     COGNITION: Overall cognitive status: Within functional limits for tasks assessed     SENSATION: Light touch: WFL Pt reports some numbness noted along L lateral knee, but able to feel light touch.   EDEMA:  Mild swelling noted in L knee consistent with s/p TKA  PALPATION: tenderness at L lateral knee near Gerdy's tubercle, pt described as burning sensation   LOWER EXTREMITY ROM:  Active ROM Right eval Left eval  Hip flexion    Hip extension    Hip abduction    Hip adduction    Hip internal rotation    Hip external rotation    Knee flexion [facility] WFL; decreased compared to R  Knee extension [facility]  Ankle dorsiflexion    Ankle plantarflexion    Ankle inversion    Ankle eversion     (Blank rows = not tested)  LOWER EXTREMITY MMT:  MMT Right eval Left eval  Hip flexion 4- 3+*  Hip extension    Hip abduction 4+ 4+  Hip adduction 4+ 4+  Hip internal rotation    Hip external rotation    Knee flexion 4+ 4-  Knee extension 4+ 4-  Ankle dorsiflexion 4- 4-  Ankle plantarflexion 5 5  Ankle inversion    Ankle eversion     (Blank rows = not tested) *=pain reported by  pt   FUNCTIONAL TESTS:  5 times sit to stand: 18.19 sec Timed up and go (TUG): 10.23 sec 10 meter walk test: average normal walking speed: 1.11 m/s; average fast walking speed: 1.21 m/s  GAIT: Assistive device utilized: None Level of assistance: Complete Independence Comments: antalgic gait noted; L knee buckling with increased gait speed during                                                                                                                                TREATMENT DATE: 12/20/2023    Self-Care/Home Management:  Education provided in regards to desensitization of the area and utilizing different fabrics and pressures to rub around and on the painful areas to assist in pain management.  Pt appreciative of the education provided and will attempt to perform at home.  Pt given demonstration of the technique with a wash cloth at gym as well.  HEP generated and given to pt during the session today and pt provided education on proper performance.  TherEx:   Seated hamstring curls with BlueTB, 2x10 each LE Seated hip adduction with physioball placed in between thighs for comfort, 2-3 sec holds, 2x10  Supine LAQ with red bolster  placed under knee, 2x10 each LE Supine SLR with quad set prior to lift off, 2x10 L LE only  Hooklying bridges with glute squeeze prior to lift off, 2x10  Supine B hamstring stretch, SLR, 30 sec bouts Supine B hamstring stretch, bent knee, 30 sec bouts Supine ITB stretch, 30 sec bouts on L LE only, x2     PATIENT EDUCATION:  Education details: POC as related to future sessions, implementation of HEP at next session Person educated: Patient Education method: Explanation Education comprehension: verbalized understanding and needs further education  HOME EXERCISE PROGRAM: Access Code: [email or web address] URL: [email or web address] Date: 12/20/2023 Prepared by: Sidra Simpers  Exercises - Supine Bridge  - 1 x daily - 7 x weekly - 3  sets - 10 reps - Supine Active Straight Leg Raise  - 1 x daily - 7 x weekly - 3 sets - 10 reps - Supine Hip Adduction Isometric with Ball  - 1 x daily - 7 x weekly - 3 sets - 10 reps - Seated Quad Set  - 1 x daily - 7 x weekly - 3 sets - 10 reps - Hooklying Abduction with Resistance  - 1 x daily - 7 x weekly - 3 sets - 10 reps  ASSESSMENT:  CLINICAL IMPRESSION:  Pt performed well with the exercises and was able to perform with good technique.  Pt still noting some medial pain at the conclusion of the session, and normally pt reports the pain to be more lateral.  Pt was given stretches and exercises to perform as part of HEP.  Pt was given opportunity to ask any questions and pt reported she did not have any at this time.  Pt will continue to perform strengthening exercises in order to improve stability of the knee and manual therapy approaches to improve pain levels as well.   Pt will continue to benefit from skilled therapy to address remaining deficits in order to improve overall QoL and return to PLOF.     OBJECTIVE IMPAIRMENTS: Abnormal gait, decreased activity tolerance, decreased balance, decreased knowledge of condition, decreased mobility, difficulty walking, decreased ROM, decreased strength, increased edema, increased fascial restrictions, and pain.   ACTIVITY LIMITATIONS: lifting, bending, sitting, standing, squatting, sleeping, stairs, transfers, bed mobility, dressing, and locomotion level  PARTICIPATION LIMITATIONS: cleaning, laundry, and community activity  PERSONAL FACTORS: Age, Fitness, Sex, Time since onset of injury/illness/exacerbation, and 3+ comorbidities: DM, HTN, CKD, hyperlipidemia, obesity, OA, OSA on CPAP, hx of bladder cancer, insomnia are also affecting patient's functional outcome.   REHAB POTENTIAL: Good  CLINICAL DECISION MAKING: Stable/uncomplicated  EVALUATION COMPLEXITY: Low   GOALS: Goals reviewed with patient? Yes  SHORT TERM GOALS: Target date:  01/25/24 Patient will be independent with home exercise program to improve strength/mobility for increased functional independence with ADLs and mobility. Baseline: to be established at future visit  Goal status: INITIAL  LONG TERM GOALS: Target date: 03/07/24   Patient will complete five times sit to stand test (5XSTS) in 12.8 seconds indicating an increased LE strength and improved balance & match age-based norms. Baseline: 18.19 sec Goal status: INITIAL  2.  Patient will increase 10 meter walk test to >1.87m/s without antalgic gait or knee buckling as to improve gait speed for better community ambulation and to reduce fall risk. Baseline: normal: 1.11 m/s with antalgic gait; fast: 1.21 m/s with antalgic gait, knee buckling Goal status: INITIAL  3.  Patient will increase lower extremity functional scale to >60/80 to demonstrate improved functional mobility and increased tolerance  with ADLs.  Baseline: 40/80 Goal status: INITIAL  4.  Patient will increase six minute walk test distance to >1081ft for progression to community level ambulation, demonstrating improved gait endurance.  Baseline: need to assess Goal status: INITIAL  5.  Patient will be able to complete stair navigation independently without UE support & significant increase in pain.  Baseline: need to assess Goal status: INITIAL  6.  Pt will decrease worst knee pain as reported on NPRS by at least 4 points in order to demonstrate clinically significant reduction in knee pain.  Baseline: 10/10 Goal status: INITIAL   PLAN:  PT FREQUENCY: 1-2x/week  PT DURATION: 12 weeks  PLANNED INTERVENTIONS: [id number]- PT Re-evaluation, 97750- Physical Performance Testing, 97110-Therapeutic exercises, 97530- Therapeutic activity, [id number]- Neuromuscular re-education, 97535- Self Care, [id number]- Manual therapy, [phone]- Gait training, [phone]- Canalith repositioning, [id number]- Electrical stimulation (manual), [phone] (1-2 muscles), [id number] (3+ muscles)-  Dry Needling, Patient/Family education, Balance training, Stair training, Joint mobilization, Scar mobilization, Cryotherapy, and Moist heat  PLAN FOR NEXT SESSION:   -Establish HEP  -Review & progress HEP from previous PT if applicable -LE strengthening  - hip flexion, knee extension -isometrics for pain modulation as appropriate -manual therapy as appropriate   Fonda Simpers, PT, DPT Physical Therapist - [facility]  [facility]  12/20/23, 8:24 AM

## 2023-12-24 ENCOUNTER — Ambulatory Visit

## 2023-12-24 ENCOUNTER — Encounter

## 2023-12-24 DIAGNOSIS — R2681 Unsteadiness on feet: Secondary | ICD-10-CM

## 2023-12-24 DIAGNOSIS — R269 Unspecified abnormalities of gait and mobility: Secondary | ICD-10-CM

## 2023-12-24 DIAGNOSIS — M25562 Pain in left knee: Secondary | ICD-10-CM | POA: Diagnosis not present

## 2023-12-24 DIAGNOSIS — M6281 Muscle weakness (generalized): Secondary | ICD-10-CM

## 2023-12-24 DIAGNOSIS — G8929 Other chronic pain: Secondary | ICD-10-CM

## 2023-12-24 NOTE — Therapy (Signed)
 OUTPATIENT PHYSICAL THERAPY LOWER EXTREMITY TREATMENT   Patient Name: Kelsey Mendoza MRN: [id number] DOB:[birth year], 73 y.o., female Today's Date: 12/24/2023  END OF SESSION:    PT End of Session - 12/24/23 1441     Visit Number 4    Number of Visits 24    Date for PT Re-Evaluation 03/06/24    Progress Note Due on Visit 10    PT Start Time 1341    PT Stop Time 1425    PT Time Calculation (min) 44 min    Equipment Utilized During Treatment Gait belt    Activity Tolerance Patient tolerated treatment well;No increased pain    Behavior During Therapy WFL for tasks assessed/performed          Past Medical History:  Diagnosis Date   Bladder cancer (HCC) 2000   resection x 3 and BCG therapy   CAD (coronary artery disease)    Chronic kidney disease, stage 3 (HCC)    Diastolic dysfunction    Diverticulosis    Fatigue    History of bilateral cataract extraction    HLD (hyperlipidemia)    HTN (hypertension)    Insomnia    Long-term current use of immunosuppressive biologic agent    a.) deucravacitinib   Obstructive sleep apnea with dependence on continuous positive airway pressure (CPAP)    Pericardial effusion    Primary osteoarthritis of left knee    Psoriasis of scalp    a.) Tx'd with deucravacitinib   Type 2 diabetes mellitus (HCC)    Past Surgical History:  Procedure Laterality Date   BLADDER SURGERY     x 3 resection bladder cancer   BROW LIFT Bilateral 05/27/2021   Procedure: BLEPHAROPLASTY UPPER EYELID; W/EXCESS SKIN AND BROW PTOSIS REPAIR BILATERAL;  Surgeon: Ashley Greig HERO, MD;  Location: [facility];  Service: Ophthalmology;  Laterality: Bilateral;   CARPAL TUNNEL RELEASE Right 06/06/2017   Procedure: CARPAL TUNNEL RELEASE;  Surgeon: Marchia Drivers, MD;  Location: [facility];  Service: Orthopedics;  Laterality: Right;   CATARACT EXTRACTION W/ INTRAOCULAR LENS  IMPLANT, BILATERAL Bilateral 08/2019   COLONOSCOPY  07/16/2012   EYE SURGERY Left  11/26/2019   Dr. Elner, Vit, Mem Benedetto   I & D EXTREMITY Right 06/06/2017   Procedure: IRRIGATION AND DEBRIDEMENT EXTREMITY--RIGHT ARM;  Surgeon: Marchia Drivers, MD;  Location: [facility];  Service: Orthopedics;  Laterality: Right;   KNEE ARTHROSCOPY Left 10/06/2019   Procedure: ARTHROSCOPY KNEE,PARTIAL MEDIAL MENISECTOMY,PARTIAL CHONDROPLASTY;  Surgeon: Mardee Lynwood SQUIBB, MD;  Location: [facility];  Service: Orthopedics;  Laterality: Left;   LUMBAR SPINE SURGERY     MEDIAL PARTIAL KNEE REPLACEMENT Right 12/29/2016   Makoplasty   OPEN REDUCTION INTERNAL FIXATION (ORIF) DISTAL RADIAL FRACTURE N/A 06/06/2017   Procedure: OPEN REDUCTION INTERNAL FIXATION (ORIF) DISTAL RADIAL FRACTURE;  Surgeon: Marchia Drivers, MD;  Location: [facility];  Service: Orthopedics;  Laterality: N/A;   TOTAL KNEE ARTHROPLASTY Left 07/16/2023   Procedure: ARTHROPLASTY, KNEE, TOTAL;  Surgeon: Lorelle Hussar, MD;  Location: [facility];  Service: Orthopedics;  Laterality: Left;   VAGINAL HYSTERECTOMY     VITRECTOMY Bilateral    Patient Active Problem List   Diagnosis Date Noted   S/P TKR (total knee replacement), left 07/16/2023   Primary osteoarthritis of left knee 06/11/2023   Elevated TSH 04/18/2022   Benign hypertension with CKD (chronic kidney disease) stage III (HCC) 01/17/2022   Hyperlipidemia associated with type 2 diabetes mellitus (HCC) 01/17/2022   OSA on CPAP 06/13/2021   Posterior  capsular opacification, right 03/25/2020   Postoperative follow-up 03/18/2020   Left posterior capsular opacification 02/12/2020   Left epiretinal membrane 11/10/2019   Right epiretinal membrane 11/10/2019   Cystoid macular edema of left eye 11/10/2019   Glaucoma suspect, bilateral 11/10/2019   Cancer (HCC) 10/04/2019   Closed Colles' fracture 07/03/2017   Distal radius fracture, right 06/05/2017   Contusion of left knee 11/27/2016   Contusion of right knee 11/27/2016   Depression 03/26/2014   Depressed affect 09/25/2013    Difficulty sleeping 09/25/2013   Type 2 diabetes mellitus with other specified complication (HCC) 09/03/2013   Diverticular disease 07/17/2012   Diverticulosis of colon 07/17/2012   Arthritis 07/14/2012   Bladder cancer (HCC) 07/14/2012    PCP: Marsa Officer, DO  REFERRING PROVIDER: Arthea Sheer, MD  REFERRING DIAG: M70.52 (ICD-10-CM) - Other bursitis of knee, left knee   THERAPY DIAG:   Chronic pain of left knee  Muscle weakness (generalized)  Unsteadiness on feet  Abnormality of gait  Rationale for Evaluation and Treatment: Rehabilitation  ONSET DATE: L TKA 07/16/23; knee pain noted at physician visit 10/26/23.   SUBJECTIVE:   SUBJECTIVE STATEMENT:  Pt reports her head is hurting her more than the knee it.  Pt reports 1-1.5/10 in the knee, reports 8/10 pain in the head.       From EVAL:  Pt stated that she had R knee replacement a couple of years ago without complications, healed well.  Following most recent L knee surgery, pt reported that she saw PT for several weeks. Pt reported continuing to do HEP provided by previous PT to include following exercises:  Heel raises Heel slides Squats Pt educated to make list/bring in handout of exercises that she was previously given.   Pt stated that she was told she is experiencing tendonitis. Pt states that L knee pain is sharp, along lateral side of knee near Gerdy's tubercle, arc under knee. Pt stated that pain is tolerable during the day, but it wakes her up at night; usually not able to go back to sleep. Pt stated that her pain gets worse as the day goes on & is dependent on level of activity. When pain is at it's worst, it takes 5-10 min. to calm down to a more tolerable level.   Pt reports that her pain is exacerbated by movement throughout the day, as well as moving the wrong way. Pt stated that she tries to avoid twisting at the knee.  Pt reports that ice helps, tylenol  helps to a degree, & celebrex   doesn't do much.   Pt reports most difficulty getting in & out of the car; Pt reported that getting up from chairs can be difficult; pain limits her from walking & being more active, would like to be able to walk dog.  Pt states that she does have goats, uses walking sticks for balance when outdoors to tend to them.     PERTINENT HISTORY: PMH: DM, HTN, CKD, hyperlipidemia, obesity, OA, OSA on CPAP, hx of bladder cancer, insomnia   Per ortho MD note 10/26/23: HENRINE HAYTER is a 73 y.o. female who presents for follow-up 26-month status post left total knee replacement. Patient reports she was doing fantastic improving with pain and function and then she increased going to the gym and other activities. She reports over the last few weeks she developed a new sharp pain over the proximal medial tibia which wraps around posteriorly behind her knee and runs approximately. She reports with  certain twisting motions and get up to an 8 out of 10 at its worst. She reports minimal improvement with Voltaren or pain medications. She denies any trauma to the knee. Denies any fevers chills systemic symptoms or concerning signs for infection...   The patient had been doing fantastic after her left total knee replacement. She has developed some pes anserine bursitis and hamstring tendinitis after increasing her activities. There is no instability or increased motion in flexion on her exam. We discussed treatment options for the tendinitis including topical medications, local steroid injections, bracing, physical therapy and home exercises. Patient would like to try some anti-inflammatories and a local injection and do some physical therapy for the knee. Will also give her a brace to help calm down the inflammation. We will see how she does with these interventions and have her follow-up in a few months for repeat evaluation. All questions answered she agrees with above plan.    Per ortho MD note 11/28/23: ESTIE SPROULE is a 73 y.o. female who presents for follow-up 4 months to post left total knee replacement. We saw her 1 month ago and she was having great improvement in pain and function but developed some tenderness consistent with tendinitis around the medial and lateral aspect of her knee. We did a pes anserine bursa injection and she got minimal short-term relief from it and reports ongoing pain in the same distributions. She denies any frank instability in the knee but does have sensations of pain which make it feel like it might want a give out. She has not had any falls. The patient denies fevers, chills, shortness of breath, chest pain, recent illness, or any trauma.  PAIN:  Are you having pain? Yes: NPRS scale: 10/10 at worst, 1/10 at best Pain location: lateral knee; arc pattern below patella Pain description: sharp Aggravating factors: movement Relieving factors: ice, tylenol   PRECAUTIONS: None  RED FLAGS: None   WEIGHT BEARING RESTRICTIONS: No  FALLS:  Has patient fallen in last 6 months? No  LIVING ENVIRONMENT: Lives with: lives alone Lives in: House/apartment Stairs: No Has following equipment at home: Vannie - 2 wheeled, Tour manager, Grab bars, and raised commode  OCCUPATION: retired Lexicographer, former LPN  PLOF: Independent  PATIENT GOALS: reduce knee pain; get stronger; would like to be more active, be able to walk dog   NEXT MD VISIT: follow-up w/ ortho in ~1 mo.   OBJECTIVE:  Note: Objective measures were completed at Evaluation unless otherwise noted.  DIAGNOSTIC FINDINGS: Per ortho MD note 11/28/23: 3 view x-rays AP, lateral, sunrise of the left knee ordered and taken today in clinic and images reviewed by myself show status post total knee arthroplasty with components in appropriate position. Patella tracking midline. No evidence of periprosthetic loosening or fracture.    PATIENT SURVEYS:  LEFS: Extreme difficulty/unable (0), Quite a bit of  difficulty (1), Moderate difficulty (2), Little difficulty (3), No difficulty (4) Survey date:   12/13/23  Any of your usual work, housework or school activities 3  2. Usual hobbies, recreational or sporting activities 4  3. Getting into/out of the bath 4  4. Walking between rooms 3  5. Putting on socks/shoes 3  6. Squatting  3  7. Lifting an object, like a bag of groceries from the floor 3  8. Performing light activities around your home 4  9. Performing heavy activities around your home 1  10. Getting into/out of a car 2  11. Walking 2  blocks 2  12. Walking 1 mile 1  13. Going up/down 10 stairs (1 flight) 2  14. Standing for 1 hour 1  15.  sitting for 1 hour 3  16. Running on even ground 0  17. Running on uneven ground 0  18. Making sharp turns while running fast 0  19. Hopping  0  20. Rolling over in bed 1  Score total:  40/80     COGNITION: Overall cognitive status: Within functional limits for tasks assessed     SENSATION: Light touch: WFL Pt reports some numbness noted along L lateral knee, but able to feel light touch.   EDEMA:  Mild swelling noted in L knee consistent with s/p TKA  PALPATION: tenderness at L lateral knee near Gerdy's tubercle, pt described as burning sensation   LOWER EXTREMITY ROM:  Active ROM Right eval Left eval  Hip flexion    Hip extension    Hip abduction    Hip adduction    Hip internal rotation    Hip external rotation    Knee flexion [facility] WFL; decreased compared to R  Knee extension [facility]  Ankle dorsiflexion    Ankle plantarflexion    Ankle inversion    Ankle eversion     (Blank rows = not tested)  LOWER EXTREMITY MMT:  MMT Right eval Left eval  Hip flexion 4- 3+*  Hip extension    Hip abduction 4+ 4+  Hip adduction 4+ 4+  Hip internal rotation    Hip external rotation    Knee flexion 4+ 4-  Knee extension 4+ 4-  Ankle dorsiflexion 4- 4-  Ankle plantarflexion 5 5  Ankle inversion    Ankle eversion     (Blank  rows = not tested) *=pain reported by pt   FUNCTIONAL TESTS:  5 times sit to stand: 18.19 sec Timed up and go (TUG): 10.23 sec 10 meter walk test: average normal walking speed: 1.11 m/s; average fast walking speed: 1.21 m/s  GAIT: Assistive device utilized: None Level of assistance: Complete Independence Comments: antalgic gait noted; L knee buckling with increased gait speed during                                                                                                                                TREATMENT DATE: 12/24/2023    TherEx:   Seated hamstring curls with BlueTB, 2x10 each LE Seated hip adduction with physioball placed in between thighs for comfort, 2-3 sec holds, 2x10 Seated hamstring curls, 30 sec bouts x4  Supine LAQ with red bolster placed under knee, 2x10 each LE Supine SLR with quad set prior to lift off, 2x10 L LE only  Lateral ambulation with BTB around the forefoot, 8' each direction, x4   TherAct: To improve functional movements patterns for everyday tasks  Backwards ambulation to improve terminal knee extension of the L LE, length of hallway x2 Eccentric lowering from 6  step forward, x10 each LE leading Lateral step down from 6 step, x10 each LE leading  Standing terminal knee extension to improve overall ROM of the L knee, 3 sec holds with physioball behind knee, 2x10  Standing UE supported lunges with verbal and visual cues for proper performance, 2x10 each LE      PATIENT EDUCATION:  Education details: POC as related to future sessions, implementation of HEP at next session Person educated: Patient Education method: Explanation Education comprehension: verbalized understanding and needs further education  HOME EXERCISE PROGRAM: Access Code: [email or web address] URL: [email or web address] Date: 12/20/2023 Prepared by: Sidra Simpers  Exercises - Supine Bridge  - 1 x daily - 7 x weekly - 3 sets - 10 reps - Supine Active  Straight Leg Raise  - 1 x daily - 7 x weekly - 3 sets - 10 reps - Supine Hip Adduction Isometric with Ball  - 1 x daily - 7 x weekly - 3 sets - 10 reps - Seated Quad Set  - 1 x daily - 7 x weekly - 3 sets - 10 reps - Hooklying Abduction with Resistance  - 1 x daily - 7 x weekly - 3 sets - 10 reps  ASSESSMENT:  CLINICAL IMPRESSION:  Pt performed well with tasks given and is making good improvement in all areas.  Pt does still have a reduction in her overall terminal knee extension in the L LE, and at times, demonstrates reduced strength in the R knee which is why exercises were performed bilaterally.  Pt would also likely benefit from resisted walking in the cable column to challenge hips and knees.   Pt will continue to benefit from skilled therapy to address remaining deficits in order to improve overall QoL and return to PLOF.     OBJECTIVE IMPAIRMENTS: Abnormal gait, decreased activity tolerance, decreased balance, decreased knowledge of condition, decreased mobility, difficulty walking, decreased ROM, decreased strength, increased edema, increased fascial restrictions, and pain.   ACTIVITY LIMITATIONS: lifting, bending, sitting, standing, squatting, sleeping, stairs, transfers, bed mobility, dressing, and locomotion level  PARTICIPATION LIMITATIONS: cleaning, laundry, and community activity  PERSONAL FACTORS: Age, Fitness, Sex, Time since onset of injury/illness/exacerbation, and 3+ comorbidities: DM, HTN, CKD, hyperlipidemia, obesity, OA, OSA on CPAP, hx of bladder cancer, insomnia are also affecting patient's functional outcome.   REHAB POTENTIAL: Good  CLINICAL DECISION MAKING: Stable/uncomplicated  EVALUATION COMPLEXITY: Low   GOALS: Goals reviewed with patient? Yes  SHORT TERM GOALS: Target date: 01/25/24 Patient will be independent with home exercise program to improve strength/mobility for increased functional independence with ADLs and mobility. Baseline: to be established  at future visit  Goal status: INITIAL  LONG TERM GOALS: Target date: 03/07/24   Patient will complete five times sit to stand test (5XSTS) in 12.8 seconds indicating an increased LE strength and improved balance & match age-based norms. Baseline: 18.19 sec Goal status: INITIAL  2.  Patient will increase 10 meter walk test to >1.44m/s without antalgic gait or knee buckling as to improve gait speed for better community ambulation and to reduce fall risk. Baseline: normal: 1.11 m/s with antalgic gait; fast: 1.21 m/s with antalgic gait, knee buckling Goal status: INITIAL  3.  Patient will increase lower extremity functional scale to >60/80 to demonstrate improved functional mobility and increased tolerance with ADLs.  Baseline: 40/80 Goal status: INITIAL  4.  Patient will increase six minute walk test distance to >1082ft for progression to community level ambulation, demonstrating improved gait endurance.  Baseline: need to  assess Goal status: INITIAL  5.  Patient will be able to complete stair navigation independently without UE support & significant increase in pain.  Baseline: need to assess Goal status: INITIAL  6.  Pt will decrease worst knee pain as reported on NPRS by at least 4 points in order to demonstrate clinically significant reduction in knee pain.  Baseline: 10/10 Goal status: INITIAL   PLAN:  PT FREQUENCY: 1-2x/week  PT DURATION: 12 weeks  PLANNED INTERVENTIONS: [id number]- PT Re-evaluation, 97750- Physical Performance Testing, 97110-Therapeutic exercises, 97530- Therapeutic activity, [id number]- Neuromuscular re-education, 97535- Self Care, [id number]- Manual therapy, [id number]- Gait training, [phone]- Canalith repositioning, [id number]- Electrical stimulation (manual), [phone] (1-2 muscles), [id number] (3+ muscles)- Dry Needling, Patient/Family education, Balance training, Stair training, Joint mobilization, Scar mobilization, Cryotherapy, and Moist heat  PLAN FOR NEXT SESSION:   -Establish  HEP  -Review & progress HEP from previous PT if applicable -LE strengthening  - hip flexion, knee extension -isometrics for pain modulation as appropriate -manual therapy as appropriate   Fonda Simpers, PT, DPT Physical Therapist - [facility]  12/24/23, 2:41 PM

## 2023-12-25 ENCOUNTER — Encounter: Admitting: Physical Therapy

## 2023-12-27 ENCOUNTER — Ambulatory Visit

## 2023-12-27 ENCOUNTER — Encounter: Payer: Self-pay | Admitting: Family Medicine

## 2023-12-27 ENCOUNTER — Ambulatory Visit (INDEPENDENT_AMBULATORY_CARE_PROVIDER_SITE_OTHER): Admitting: Family Medicine

## 2023-12-27 VITALS — BP 132/74 | HR 67 | Ht 64.0 in | Wt 161.2 lb

## 2023-12-27 DIAGNOSIS — G43909 Migraine, unspecified, not intractable, without status migrainosus: Secondary | ICD-10-CM

## 2023-12-27 DIAGNOSIS — R269 Unspecified abnormalities of gait and mobility: Secondary | ICD-10-CM

## 2023-12-27 DIAGNOSIS — G8929 Other chronic pain: Secondary | ICD-10-CM

## 2023-12-27 DIAGNOSIS — G44209 Tension-type headache, unspecified, not intractable: Secondary | ICD-10-CM | POA: Diagnosis not present

## 2023-12-27 DIAGNOSIS — R2681 Unsteadiness on feet: Secondary | ICD-10-CM

## 2023-12-27 DIAGNOSIS — M6281 Muscle weakness (generalized): Secondary | ICD-10-CM

## 2023-12-27 DIAGNOSIS — M25562 Pain in left knee: Secondary | ICD-10-CM | POA: Diagnosis not present

## 2023-12-27 MED ORDER — RIZATRIPTAN BENZOATE 10 MG PO TBDP: 10.0000 mg | ORAL_TABLET | ORAL | 0 refills | Status: DC | PRN

## 2023-12-27 NOTE — Progress Notes (Signed)
 Subjective:    Patient ID: Kelsey Mendoza, female    DOB: Dec 21, 1950, 73 y.o.   MRN: [id number]  Kelsey Mendoza is a 73 y.o. female presenting on 12/27/2023 for Headache  Patient presents for a same day appointment.  HPI  Discussed the use of AI scribe software for clinical note transcription with the patient, who gave verbal consent to proceed.  History of Present Illness   Kelsey Mendoza is a 73 year old female who presents with persistent headaches.  Cephalalgia (headache) - Persistent headache since Saturday, progressively worsening until today - Pain described as pressure located in the temples and forehead, primarily frontal and across the top and front of the head - No involvement of the back or sides of the head - Slight improvement today compared to previous days, but not resolved - No significant relief with two Tylenol  taken around the clock - Tried one Advil , which was insufficient for symptom relief - Continues to take Celebrex , last dose this morning - No associated visual symptoms such as photophobia, blurry vision, or vision loss - No associated nausea, phonophobia, or other typical migraine symptoms - No sick symptoms such as sinus issues, cough, congestion, or ear problems - No breathing difficulties or other systemic symptoms - History of migraines, but current headache feels different and is primarily characterized by pressure or tension  Medication and caffeine use - Taking Tylenol  regularly for headache without significant relief - Took one Advil , which was insufficient - Continues Celebrex , last dose this morning - Takes loratadine  daily without improvement in headache or other allergy symptoms - Drinks one cup of coffee in the morning and occasionally a cup of decaf in the evening - No recent changes in caffeine intake  Psychological stress and anxiety - Experiencing anxiety related to an upcoming oral biopsy - Concerned that stress may  be contributing to headache symptoms        12/14/2023    9:53 AM 11/07/2023    4:26 PM 09/21/2023    2:04 PM  Depression screen PHQ 2/9  Decreased Interest 0 0 0  Down, Depressed, Hopeless 0 0 0  PHQ - 2 Score 0 0 0  Altered sleeping 3 3 0  Tired, decreased energy 1 1 0  Change in appetite  0 0  Feeling bad or failure about yourself  0 0 0  Trouble concentrating 0 0 0  Moving slowly or fidgety/restless 0 0 0  Suicidal thoughts 0 0 0  PHQ-9 Score 4 4 0  Difficult doing work/chores Not difficult at all Somewhat difficult Not difficult at all       12/14/2023    9:53 AM 11/07/2023    4:27 PM 06/11/2023    9:24 AM 12/12/2022    9:06 AM  GAD 7 : Generalized Anxiety Score  Nervous, Anxious, on Edge 0 0 0 0  Control/stop worrying 0 0 0 0  Worry too much - different things 0 0 0 0  Trouble relaxing 0 0 0 0  Restless 0 0 0 0  Easily annoyed or irritable 0 0 0 0  Afraid - awful might happen 0 0 0 0  Total GAD 7 Score 0 0 0 0  Anxiety Difficulty  Not difficult at all      Social History   Tobacco Use   Smoking status: Former    Current packs/day: 0.00    Types: Cigarettes    Quit date: 03/01/1983    Years since quitting:  40.8   Smokeless tobacco: Never  Vaping Use   Vaping status: Never Used  Substance Use Topics   Alcohol use: No   Drug use: No    Review of Systems Per HPI unless specifically indicated above     Objective:    BP 132/74 (BP Location: Left Arm, Patient Position: Sitting, Cuff Size: Normal)   Pulse 67   Ht 5' 4 (1.626 m)   Wt 161 lb 4 oz (73.1 kg)   SpO2 96%   BMI 27.68 kg/m   Wt Readings from Last 3 Encounters:  12/27/23 161 lb 4 oz (73.1 kg)  12/14/23 158 lb 2 oz (71.7 kg)  11/07/23 162 lb 8 oz (73.7 kg)    Physical Exam Vitals and nursing note reviewed.  Constitutional:      General: She is not in acute distress.    Appearance: She is well-developed. She is not diaphoretic.     Comments: Well-appearing, comfortable, cooperative  HENT:      Head: Normocephalic and atraumatic.     Right Ear: Tympanic membrane, ear canal and external ear normal. There is no impacted cerumen.     Left Ear: Tympanic membrane, ear canal and external ear normal. There is no impacted cerumen.  Eyes:     General:        Right eye: No discharge.        Left eye: No discharge.     Conjunctiva/sclera: Conjunctivae normal.  Neck:     Thyroid : No thyromegaly.  Cardiovascular:     Rate and Rhythm: Normal rate and regular rhythm.     Heart sounds: Normal heart sounds. No murmur heard. Pulmonary:     Effort: Pulmonary effort is normal. No respiratory distress.     Breath sounds: Normal breath sounds. No wheezing or rales.  Musculoskeletal:        General: Normal range of motion.     Cervical back: Normal range of motion and neck supple.  Lymphadenopathy:     Cervical: No cervical adenopathy.  Skin:    General: Skin is warm and dry.     Findings: No erythema or rash.  Neurological:     Mental Status: She is alert and oriented to person, place, and time.  Psychiatric:        Behavior: Behavior normal.     Comments: Well groomed, good eye contact, normal speech and thoughts     Results for orders placed or performed in visit on 12/14/23  POCT HgB A1C   Collection Time: 12/14/23  9:59 AM  Result Value Ref Range   Hemoglobin A1C 5.7 (A) 4.0 - 5.6 %   HbA1c POC (<> result, manual entry)     HbA1c, POC (prediabetic range)     HbA1c, POC (controlled diabetic range)        Assessment & Plan:   Problem List Items Addressed This Visit   None Visit Diagnoses       Acute non intractable tension-type headache    -  Primary   Relevant Medications   rizatriptan  (MAXALT -MLT) 10 MG disintegrating tablet     Episodic migraine       Relevant Medications   rizatriptan  (MAXALT -MLT) 10 MG disintegrating tablet        Acute headache (tension-type vs migraine vs sinus pressure) Headache localized to temple and forehead with pressure sensation. No  relief from acetaminophen . Differential includes tension-type headache, migraine, and sinus pressure headache. Stress or anxiety may contribute. No emergency signs. History of  migraine in past - consider this option   No further diagnostic required today, given some improvement, lack of other concerning features or symptoms. Hemodynamically stable, no neurological symptoms.  Discussed variety of headache treatment options to see which is most effective for her.  - Prescribed rizatriptan  (Maxalt ) low-dose dissolving tablet. Take one at onset, repeat after two hours if needed, max two doses in 24 hours. - Recommend Excedrin as per package instructions. - Advise ibuprofen  (Advil ) 600 mg burst dose if needed, after discontinuing Celebrex . - Suggest behind-the-counter Sudafed pseudoephedrine for pressure relief. - Instructed to mix and match treatments for relief, avoid simultaneous use.         No orders of the defined types were placed in this encounter.   Meds ordered this encounter  Medications   rizatriptan  (MAXALT -MLT) 10 MG disintegrating tablet    Sig: Take 1 tablet (10 mg total) by mouth as needed. May repeat in 2 hours if needed    Dispense:  10 tablet    Refill:  0    Follow up plan: Return if symptoms worsen or fail to improve.   Marsa Officer, DO [facility] 12/27/2023, 11:18 AM

## 2023-12-27 NOTE — Patient Instructions (Addendum)
 Thank you for coming to the office today.  Unsure exactly the type of headache Consistent with tension vs pressure vs possible migraine  This may be Migraine Headache - Migraine headaches present differently for many patients, pain is usually throbbing or aching, often on one side of head or behind the eye. They tend to last for up to hours or days. In treating migraines, our goal is to 1) stop the headache and 2) prevent recurrence of headaches  Treatment to STOP the headache at this time: - Start with Rizatriptan  10mg  - take 1 immediately at onset of moderate to severe migraine headache, if unresolved or return within 2 hours then repeat dose 1 tablet, that is max dose for 24 hours. (Note - this medication can cause a brief episode of flushing and chest pressure or pain very soon after taking it. That is NORMAL, and it is the medicine taking effect and dilating some blood vessels. It should pass, and resolve within seconds to minutes after - it may not happen at all)  Consider Excedrin Migraine OTC as this has caffeine and can help break the cycle  Next try Ibuprofen  Advil  200mg  per tablet take 3 tablets = 600mg  x 1 dose up to 3 times a day with meal.  Sudafed original formula behind the counter.  Please schedule a Follow-up Appointment to: Return if symptoms worsen or fail to improve.  If you have any other questions or concerns, please feel free to call the office or send a message through MyChart. You may also schedule an earlier appointment if necessary.  Additionally, you may be receiving a survey about your experience at our office within a few days to 1 week by e-mail or mail. We value your feedback.  Marsa Officer, DO [facility], [place]

## 2023-12-27 NOTE — Therapy (Signed)
 OUTPATIENT PHYSICAL THERAPY TREATMENT  Patient Name: CAMILE ESTERS MRN: [id number] DOB:October 03, 1950, 73 y.o., female Today's Date: 12/27/2023  END OF SESSION:    PT End of Session - 12/27/23 1319     Visit Number 5    Number of Visits 24    Date for Recertification  03/06/24    Authorization Type HTA    Progress Note Due on Visit 10    PT Start Time 1315    PT Stop Time 1355    PT Time Calculation (min) 40 min    Activity Tolerance Patient tolerated treatment well;No increased pain    Behavior During Therapy WFL for tasks assessed/performed          Past Medical History:  Diagnosis Date   Bladder cancer (HCC) 2000   resection x 3 and BCG therapy   CAD (coronary artery disease)    Chronic kidney disease, stage 3 (HCC)    Diastolic dysfunction    Diverticulosis    Fatigue    History of bilateral cataract extraction    HLD (hyperlipidemia)    HTN (hypertension)    Insomnia    Long-term current use of immunosuppressive biologic agent    a.) deucravacitinib   Obstructive sleep apnea with dependence on continuous positive airway pressure (CPAP)    Pericardial effusion    Primary osteoarthritis of left knee    Psoriasis of scalp    a.) Tx'd with deucravacitinib   Type 2 diabetes mellitus (HCC)    Past Surgical History:  Procedure Laterality Date   BLADDER SURGERY     x 3 resection bladder cancer   BROW LIFT Bilateral 05/27/2021   Procedure: BLEPHAROPLASTY UPPER EYELID; W/EXCESS SKIN AND BROW PTOSIS REPAIR BILATERAL;  Surgeon: Ashley Greig HERO, MD;  Location: [facility];  Service: Ophthalmology;  Laterality: Bilateral;   CARPAL TUNNEL RELEASE Right 06/06/2017   Procedure: CARPAL TUNNEL RELEASE;  Surgeon: Marchia Drivers, MD;  Location: [facility];  Service: Orthopedics;  Laterality: Right;   CATARACT EXTRACTION W/ INTRAOCULAR LENS  IMPLANT, BILATERAL Bilateral 08/2019   COLONOSCOPY  07/16/2012   EYE SURGERY Left 11/26/2019   Dr. Elner, Vit, Mem Benedetto   I & D  EXTREMITY Right 06/06/2017   Procedure: IRRIGATION AND DEBRIDEMENT EXTREMITY--RIGHT ARM;  Surgeon: Marchia Drivers, MD;  Location: [facility];  Service: Orthopedics;  Laterality: Right;   KNEE ARTHROSCOPY Left 10/06/2019   Procedure: ARTHROSCOPY KNEE,PARTIAL MEDIAL MENISECTOMY,PARTIAL CHONDROPLASTY;  Surgeon: Mardee Lynwood SQUIBB, MD;  Location: [facility];  Service: Orthopedics;  Laterality: Left;   LUMBAR SPINE SURGERY     MEDIAL PARTIAL KNEE REPLACEMENT Right 12/29/2016   Makoplasty   OPEN REDUCTION INTERNAL FIXATION (ORIF) DISTAL RADIAL FRACTURE N/A 06/06/2017   Procedure: OPEN REDUCTION INTERNAL FIXATION (ORIF) DISTAL RADIAL FRACTURE;  Surgeon: Marchia Drivers, MD;  Location: [facility];  Service: Orthopedics;  Laterality: N/A;   TOTAL KNEE ARTHROPLASTY Left 07/16/2023   Procedure: ARTHROPLASTY, KNEE, TOTAL;  Surgeon: Lorelle Hussar, MD;  Location: [facility];  Service: Orthopedics;  Laterality: Left;   VAGINAL HYSTERECTOMY     VITRECTOMY Bilateral    Patient Active Problem List   Diagnosis Date Noted   S/P TKR (total knee replacement), left 07/16/2023   Primary osteoarthritis of left knee 06/11/2023   Elevated TSH 04/18/2022   Benign hypertension with CKD (chronic kidney disease) stage III (HCC) 01/17/2022   Hyperlipidemia associated with type 2 diabetes mellitus (HCC) 01/17/2022   OSA on CPAP 06/13/2021   Posterior capsular opacification, right 03/25/2020  Postoperative follow-up 03/18/2020   Left posterior capsular opacification 02/12/2020   Left epiretinal membrane 11/10/2019   Right epiretinal membrane 11/10/2019   Cystoid macular edema of left eye 11/10/2019   Glaucoma suspect, bilateral 11/10/2019   Cancer (HCC) 10/04/2019   Closed Colles' fracture 07/03/2017   Distal radius fracture, right 06/05/2017   Contusion of left knee 11/27/2016   Contusion of right knee 11/27/2016   Depression 03/26/2014   Depressed affect 09/25/2013   Difficulty sleeping 09/25/2013   Type 2 diabetes  mellitus with other specified complication (HCC) 09/03/2013   Diverticular disease 07/17/2012   Diverticulosis of colon 07/17/2012   Arthritis 07/14/2012   Bladder cancer (HCC) 07/14/2012    PCP: Marsa Officer, DO REFERRING PROVIDER: Arthea Sheer, MD REFERRING DIAG: M70.52 (ICD-10-CM) - Other bursitis of knee, left knee  THERAPY DIAG:   Chronic pain of left knee  Muscle weakness (generalized)  Unsteadiness on feet  Abnormality of gait  Rationale for Evaluation and Treatment: Rehabilitation ONSET DATE: L TKA 07/16/23; knee pain noted at physician visit 10/26/23.   SUBJECTIVE:   SUBJECTIVE STATEMENT: Has a HA today, MD gave her meds, but she's waiting until home due to increased drowsiness.   PERTINENT HISTORY: The patient had been doing fantastic after her left total knee replacement. She has developed some pes anserine bursitis and hamstring tendinitis after increasing her activities. There is no instability or increased motion in flexion on her exam. We discussed treatment options for the tendinitis including topical medications, local steroid injections, bracing, physical therapy and home exercises. Patient would like to try some anti-inflammatories and a local injection and do some physical therapy for the knee. Will also give her a brace to help calm down the inflammation. We will see how she does with these interventions and have her follow-up in a few months for repeat evaluation. All questions answered she agrees with above plan. PMH: DM, HTN, CKD, hyperlipidemia, obesity, OA, OSA on CPAP, hx of bladder cancer, insomnia  PAIN:  Are you having pain? 1/10 kne pain today; 3/10 HA today  PRECAUTIONS: None  WEIGHT BEARING RESTRICTIONS: No  FALLS:  Has patient fallen in last 6 months? No  LIVING ENVIRONMENT: Lives with: lives alone Lives in: House/apartment Stairs: No Has following equipment at home: Vannie - 2 wheeled, Tour manager, Grab bars, and raised  commode  OCCUPATION: retired Lexicographer, former LPN  PLOF: Independent  PATIENT GOALS:  reduce knee pain; get stronger; would like to be more active, be able to walk dog   OBJECTIVE:  Note: Objective measures were completed at Evaluation unless otherwise noted.  DIAGNOSTIC FINDINGS:  Per ortho MD note 11/28/23: 3 view x-rays AP, lateral, sunrise of the left knee ordered and taken today in clinic and images reviewed by myself show status post total knee arthroplasty with components in appropriate position. Patella tracking midline. No evidence of periprosthetic loosening or fracture.    LOWER EXTREMITY MMT:  MMT Right eval Left eval  Hip flexion 4- 3+*  Hip extension    Hip abduction 4+ 4+  Hip adduction 4+ 4+  Hip internal rotation    Hip external rotation    Knee flexion 4+ 4-  Knee extension 4+ 4-  Ankle dorsiflexion 4- 4-  Ankle plantarflexion 5 5   (Blank rows = not tested) *=pain reported by pt  FUNCTIONAL TESTS:  5 times sit to stand: 18.19 sec Timed up and go (TUG): 10.23 sec 10 meter walk test: average normal walking speed: 1.11 m/s;  average fast walking speed: 1.21 m/s                                                                                                                              TREATMENT DATE: 12/27/2023 -Hooklying SAQ 10x5secH bilat  -Hooklying bridge x12  -isometric hooklying clam against gait belt 10x5secH   -SAQ SAQ @ 5lb (around foot) 1x15 @ 1secH bilat -Hooklying bridge x12  -isometric hooklying clam against gait belt 10x5secH   -SAQ SAQ @ 5lb (around foot) 1x15 @ 1secH bilat  -backward sled pull x56ft, 45lb  -wall leaning double ankle DF (feet 18 away from wall) 1x10  Poor excursion and rapid onset failure/fatigue -STS from chair hands free 1x10   -backward sled pull x6ft, 45lb  -double dorsiflexion in // bars on 1/2 roller (beter excursion and endurance)  -17 degree incline hands free balance 30sec Feels more calf  stretch, feels no anterior compartment effort -STS from chair hands free 1x10   -resisted cable walking 17.5lb 2x each direction 160t total Has some control challenges with each direction, but is able to demonstrate improved motor control at hip/ankle level on 2nd attempt each way.   PATIENT EDUCATION:  Education details: POC as related to future sessions, implementation of HEP at next session Person educated: Patient Education method: Explanation Education comprehension: verbalized understanding and needs further education  HOME EXERCISE PROGRAM: Access Code: [email or web address] URL: [email or web address] Date: 12/20/2023 Prepared by: Sidra Simpers  Exercises - Supine Bridge  - 1 x daily - 7 x weekly - 3 sets - 10 reps - Supine Active Straight Leg Raise  - 1 x daily - 7 x weekly - 3 sets - 10 reps - Supine Hip Adduction Isometric with Ball  - 1 x daily - 7 x weekly - 3 sets - 10 reps - Seated Quad Set  - 1 x daily - 7 x weekly - 3 sets - 10 reps - Hooklying Abduction with Resistance  - 1 x daily - 7 x weekly - 3 sets - 10 reps  ASSESSMENT:  CLINICAL IMPRESSION: Continued to advance interventions aimed at improving load tolerance, motor control in gait, pain, and imbalance. Pt does well with activities, no focal pain aggravation, minimal cues needed for correct performance. Pt has clear weakness in bila tnateiro compartment, but exercises are modified to achieve targeted level of resistance. Pt will continue to benefit from skilled therapy to address remaining deficits in order to improve overall QoL and return to PLOF.    OBJECTIVE IMPAIRMENTS: Abnormal gait, decreased activity tolerance, decreased balance, decreased knowledge of condition, decreased mobility, difficulty walking, decreased ROM, decreased strength, increased edema, increased fascial restrictions, and pain.   ACTIVITY LIMITATIONS: lifting, bending, sitting, standing, squatting, sleeping, stairs, transfers, bed  mobility, dressing, and locomotion level  PARTICIPATION LIMITATIONS: cleaning, laundry, and community activity  PERSONAL FACTORS: Age, Fitness, Sex, Time since onset of injury/illness/exacerbation, and 3+ comorbidities: DM, HTN, CKD, hyperlipidemia, obesity, OA, OSA on  CPAP, hx of bladder cancer, insomnia are also affecting patient's functional outcome.  REHAB POTENTIAL: Good CLINICAL DECISION MAKING: Stable/uncomplicated EVALUATION COMPLEXITY: Low   GOALS: Goals reviewed with patient? Yes  SHORT TERM GOALS: Target date: 01/25/24 Patient will be independent with home exercise program to improve strength/mobility for increased functional independence with ADLs and mobility. Baseline: to be established at future visit  Goal status: INITIAL  LONG TERM GOALS: Target date: 03/07/24  Patient will complete five times sit to stand test (5XSTS) in 12.8 seconds indicating an increased LE strength and improved balance & match age-based norms. Baseline: 18.19 sec Goal status: INITIAL  2.  Patient will increase 10 meter walk test to >1.38m/s without antalgic gait or knee buckling as to improve gait speed for better community ambulation and to reduce fall risk. Baseline: normal: 1.11 m/s with antalgic gait; fast: 1.21 m/s with antalgic gait, knee buckling Goal status: INITIAL  3.  Patient will increase lower extremity functional scale to >60/80 to demonstrate improved functional mobility and increased tolerance with ADLs.  Baseline: 40/80 Goal status: INITIAL  4.  Patient will increase six minute walk test distance to >1081ft for progression to community level ambulation, demonstrating improved gait endurance. Baseline:  Goal status: INITIAL  5.  Patient will be able to complete stair navigation independently without UE support & significant increase in pain.  Baseline:  Goal status: INITIAL  6.  Pt will decrease worst knee pain as reported on NPRS by at least 4 points in order to  demonstrate clinically significant reduction in knee pain.  Baseline: 10/10 Goal status: INITIAL   PLAN:  PT FREQUENCY: 1-2x/week PT DURATION: 12 weeks PLANNED INTERVENTIONS: [id number]- PT Re-evaluation, 97750- Physical Performance Testing, 97110-Therapeutic exercises, 97530- Therapeutic activity, 97112- Neuromuscular re-education, 97535- Self Care, [id number]- Manual therapy, [phone]- Gait training, [phone]- Canalith repositioning, [phone]- Electrical stimulation (manual), [phone] (1-2 muscles), [phone] (3+ muscles)- Dry Needling, Patient/Family education, Balance training, Stair training, Joint mobilization, Scar mobilization, Cryotherapy, and Moist heat  PLAN FOR NEXT SESSION:  -LE strengthening  - hip flexion, knee extension -isometrics for pain modulation as appropriate -manual therapy as appropriate  1:21 PM, 12/27/23 Peggye JAYSON Linear, PT, DPT Physical Therapist - [facility]  Outpatient Physical Therapy- Main Campus [phone]

## 2023-12-31 ENCOUNTER — Encounter

## 2024-01-01 ENCOUNTER — Encounter

## 2024-01-03 ENCOUNTER — Ambulatory Visit

## 2024-01-03 ENCOUNTER — Other Ambulatory Visit: Payer: Self-pay | Admitting: Family Medicine

## 2024-01-03 DIAGNOSIS — M6281 Muscle weakness (generalized): Secondary | ICD-10-CM

## 2024-01-03 DIAGNOSIS — G43909 Migraine, unspecified, not intractable, without status migrainosus: Secondary | ICD-10-CM

## 2024-01-03 DIAGNOSIS — R269 Unspecified abnormalities of gait and mobility: Secondary | ICD-10-CM

## 2024-01-03 DIAGNOSIS — M25562 Pain in left knee: Secondary | ICD-10-CM | POA: Diagnosis not present

## 2024-01-03 DIAGNOSIS — R2681 Unsteadiness on feet: Secondary | ICD-10-CM

## 2024-01-03 DIAGNOSIS — G8929 Other chronic pain: Secondary | ICD-10-CM

## 2024-01-03 DIAGNOSIS — G44209 Tension-type headache, unspecified, not intractable: Secondary | ICD-10-CM

## 2024-01-03 NOTE — Therapy (Signed)
 OUTPATIENT PHYSICAL THERAPY TREATMENT  Patient Name: Kelsey Mendoza MRN: [id number] DOB:[birth year], 73 y.o., female Today's Date: 01/03/2024  END OF SESSION:    PT End of Session - 01/03/24 1537     Visit Number 6    Number of Visits 24    Date for Recertification  03/06/24    Authorization Type HTA    Progress Note Due on Visit 10    PT Start Time 1538    PT Stop Time 1619    PT Time Calculation (min) 41 min    Activity Tolerance Patient tolerated treatment well;No increased pain    Behavior During Therapy WFL for tasks assessed/performed           Past Medical History:  Diagnosis Date   Bladder cancer (HCC) 2000   resection x 3 and BCG therapy   CAD (coronary artery disease)    Chronic kidney disease, stage 3 (HCC)    Diastolic dysfunction    Diverticulosis    Fatigue    History of bilateral cataract extraction    HLD (hyperlipidemia)    HTN (hypertension)    Insomnia    Long-term current use of immunosuppressive biologic agent    a.) deucravacitinib   Obstructive sleep apnea with dependence on continuous positive airway pressure (CPAP)    Pericardial effusion    Primary osteoarthritis of left knee    Psoriasis of scalp    a.) Tx'd with deucravacitinib   Type 2 diabetes mellitus (HCC)    Past Surgical History:  Procedure Laterality Date   BLADDER SURGERY     x 3 resection bladder cancer   BROW LIFT Bilateral 05/27/2021   Procedure: BLEPHAROPLASTY UPPER EYELID; W/EXCESS SKIN AND BROW PTOSIS REPAIR BILATERAL;  Surgeon: Ashley Greig HERO, MD;  Location: [facility];  Service: Ophthalmology;  Laterality: Bilateral;   CARPAL TUNNEL RELEASE Right 06/06/2017   Procedure: CARPAL TUNNEL RELEASE;  Surgeon: Marchia Drivers, MD;  Location: [facility];  Service: Orthopedics;  Laterality: Right;   CATARACT EXTRACTION W/ INTRAOCULAR LENS  IMPLANT, BILATERAL Bilateral 08/2019   COLONOSCOPY  07/16/2012   EYE SURGERY Left 11/26/2019   Dr. Elner, Vit, Mem Benedetto   I & D  EXTREMITY Right 06/06/2017   Procedure: IRRIGATION AND DEBRIDEMENT EXTREMITY--RIGHT ARM;  Surgeon: Marchia Drivers, MD;  Location: [facility];  Service: Orthopedics;  Laterality: Right;   KNEE ARTHROSCOPY Left 10/06/2019   Procedure: ARTHROSCOPY KNEE,PARTIAL MEDIAL MENISECTOMY,PARTIAL CHONDROPLASTY;  Surgeon: Mardee Lynwood SQUIBB, MD;  Location: [facility];  Service: Orthopedics;  Laterality: Left;   LUMBAR SPINE SURGERY     MEDIAL PARTIAL KNEE REPLACEMENT Right 12/29/2016   Makoplasty   OPEN REDUCTION INTERNAL FIXATION (ORIF) DISTAL RADIAL FRACTURE N/A 06/06/2017   Procedure: OPEN REDUCTION INTERNAL FIXATION (ORIF) DISTAL RADIAL FRACTURE;  Surgeon: Marchia Drivers, MD;  Location: [facility];  Service: Orthopedics;  Laterality: N/A;   TOTAL KNEE ARTHROPLASTY Left 07/16/2023   Procedure: ARTHROPLASTY, KNEE, TOTAL;  Surgeon: Lorelle Hussar, MD;  Location: [facility];  Service: Orthopedics;  Laterality: Left;   VAGINAL HYSTERECTOMY     VITRECTOMY Bilateral    Patient Active Problem List   Diagnosis Date Noted   S/P TKR (total knee replacement), left 07/16/2023   Primary osteoarthritis of left knee 06/11/2023   Elevated TSH 04/18/2022   Benign hypertension with CKD (chronic kidney disease) stage III (HCC) 01/17/2022   Hyperlipidemia associated with type 2 diabetes mellitus (HCC) 01/17/2022   OSA on CPAP 06/13/2021   Posterior capsular opacification, right 03/25/2020  Postoperative follow-up 03/18/2020   Left posterior capsular opacification 02/12/2020   Left epiretinal membrane 11/10/2019   Right epiretinal membrane 11/10/2019   Cystoid macular edema of left eye 11/10/2019   Glaucoma suspect, bilateral 11/10/2019   Cancer (HCC) 10/04/2019   Closed Colles' fracture 07/03/2017   Distal radius fracture, right 06/05/2017   Contusion of left knee 11/27/2016   Contusion of right knee 11/27/2016   Depression 03/26/2014   Depressed affect 09/25/2013   Difficulty sleeping 09/25/2013   Type 2 diabetes  mellitus with other specified complication (HCC) 09/03/2013   Diverticular disease 07/17/2012   Diverticulosis of colon 07/17/2012   Arthritis 07/14/2012   Bladder cancer (HCC) 07/14/2012    PCP: Marsa Officer, DO REFERRING PROVIDER: Arthea Sheer, MD REFERRING DIAG: M70.52 (ICD-10-CM) - Other bursitis of knee, left knee  THERAPY DIAG:   Chronic pain of left knee  Muscle weakness (generalized)  Unsteadiness on feet  Abnormality of gait  Rationale for Evaluation and Treatment: Rehabilitation ONSET DATE: L TKA 07/16/23; knee pain noted at physician visit 10/26/23.   SUBJECTIVE:   SUBJECTIVE STATEMENT:  Reports having another Migraine so not feeling great and reports Head is bothering her more that her knee. Rates left knee pain at  maybe 2-3/10. I feel like the knee is making progress.    PERTINENT HISTORY: The patient had been doing fantastic after her left total knee replacement. She has developed some pes anserine bursitis and hamstring tendinitis after increasing her activities. There is no instability or increased motion in flexion on her exam. We discussed treatment options for the tendinitis including topical medications, local steroid injections, bracing, physical therapy and home exercises. Patient would like to try some anti-inflammatories and a local injection and do some physical therapy for the knee. Will also give her a brace to help calm down the inflammation. We will see how she does with these interventions and have her follow-up in a few months for repeat evaluation. All questions answered she agrees with above plan. PMH: DM, HTN, CKD, hyperlipidemia, obesity, OA, OSA on CPAP, hx of bladder cancer, insomnia  PAIN:  Are you having pain? 1/10 kne pain today; 3/10 HA today  PRECAUTIONS: None  WEIGHT BEARING RESTRICTIONS: No  FALLS:  Has patient fallen in last 6 months? No  LIVING ENVIRONMENT: Lives with: lives alone Lives in: House/apartment Stairs:  No Has following equipment at home: Vannie - 2 wheeled, Tour manager, Grab bars, and raised commode  OCCUPATION: retired Lexicographer, former LPN  PLOF: Independent  PATIENT GOALS:  reduce knee pain; get stronger; would like to be more active, be able to walk dog   OBJECTIVE:  Note: Objective measures were completed at Evaluation unless otherwise noted.  DIAGNOSTIC FINDINGS:  Per ortho MD note 11/28/23: 3 view x-rays AP, lateral, sunrise of the left knee ordered and taken today in clinic and images reviewed by myself show status post total knee arthroplasty with components in appropriate position. Patella tracking midline. No evidence of periprosthetic loosening or fracture.    LOWER EXTREMITY MMT:  MMT Right eval Left eval  Hip flexion 4- 3+*  Hip extension    Hip abduction 4+ 4+  Hip adduction 4+ 4+  Hip internal rotation    Hip external rotation    Knee flexion 4+ 4-  Knee extension 4+ 4-  Ankle dorsiflexion 4- 4-  Ankle plantarflexion 5 5   (Blank rows = not tested) *=pain reported by pt  FUNCTIONAL TESTS:  5 times sit to stand:  18.19 sec Timed up and go (TUG): 10.23 sec 10 meter walk test: average normal walking speed: 1.11 m/s; average fast walking speed: 1.21 m/s                                                                                                                              TREATMENT DATE: 01/03/2024    THEREX:  Self stretch at steps  - knee flex - LLE hold 30 sec x 5  -Knee ext- LLE hold 30 sec x 5  Standing L hip ER (isometric) - hold 5 sec x 10 reps (denied any knee pain)  and no difficulty standing  at wall with back of chair support for UE.   Sit to stand with hip add squeeze on pillow between knees (from raised height of mat approx 24 height) x 5 sec hold  x 15 reps  Seated hip add squeeze using ball- hold 5 sec 2 x 10 reps   Seated Hip IR -  (squeeze knees into pillow) while moving feet apart with GTB around ankles x 15  reps  Seated knee ext active x 10 reps (no reported pain)   Seated ham curls 2 x 10 reps with GTB  Manual therapy:  - sitting at EOM- Grade 2 tibiofemoral joint glides - PA then AP motions x approx 20 reps each Leg  -gentle patella mobs - grade 2  (inf to sup and med to lateral) x approx 20 reps (no pain)           PATIENT EDUCATION:  Education details: POC as related to future sessions, implementation of HEP at next session Person educated: Patient Education method: Explanation Education comprehension: verbalized understanding and needs further education  HOME EXERCISE PROGRAM: Access Code: [email or web address] URL: [email or web address] Date: 12/20/2023 Prepared by: Sidra Simpers  Exercises - Supine Bridge  - 1 x daily - 7 x weekly - 3 sets - 10 reps - Supine Active Straight Leg Raise  - 1 x daily - 7 x weekly - 3 sets - 10 reps - Supine Hip Adduction Isometric with Ball  - 1 x daily - 7 x weekly - 3 sets - 10 reps - Seated Quad Set  - 1 x daily - 7 x weekly - 3 sets - 10 reps - Hooklying Abduction with Resistance  - 1 x daily - 7 x weekly - 3 sets - 10 reps  ASSESSMENT:  CLINICAL IMPRESSION: Patient again responded positively to treatment- reporting no pain during session with manual technique and later with all therex. She was able to full flex her knee with stretching today without report of pain and progress supporting knee muscle strengthening appropriately without pain and good ability to return demonstration for safe technique.  Pt will continue to benefit from skilled therapy to address remaining deficits in order to improve overall QoL and return to PLOF.    OBJECTIVE IMPAIRMENTS: Abnormal gait, decreased activity tolerance, decreased balance, decreased knowledge of  condition, decreased mobility, difficulty walking, decreased ROM, decreased strength, increased edema, increased fascial restrictions, and pain.   ACTIVITY LIMITATIONS: lifting, bending, sitting,  standing, squatting, sleeping, stairs, transfers, bed mobility, dressing, and locomotion level  PARTICIPATION LIMITATIONS: cleaning, laundry, and community activity  PERSONAL FACTORS: Age, Fitness, Sex, Time since onset of injury/illness/exacerbation, and 3+ comorbidities: DM, HTN, CKD, hyperlipidemia, obesity, OA, OSA on CPAP, hx of bladder cancer, insomnia are also affecting patient's functional outcome.  REHAB POTENTIAL: Good CLINICAL DECISION MAKING: Stable/uncomplicated EVALUATION COMPLEXITY: Low   GOALS: Goals reviewed with patient? Yes  SHORT TERM GOALS: Target date: 01/25/24 Patient will be independent with home exercise program to improve strength/mobility for increased functional independence with ADLs and mobility. Baseline: to be established at future visit  Goal status: INITIAL  LONG TERM GOALS: Target date: 03/07/24  Patient will complete five times sit to stand test (5XSTS) in 12.8 seconds indicating an increased LE strength and improved balance & match age-based norms. Baseline: 18.19 sec Goal status: INITIAL  2.  Patient will increase 10 meter walk test to >1.46m/s without antalgic gait or knee buckling as to improve gait speed for better community ambulation and to reduce fall risk. Baseline: normal: 1.11 m/s with antalgic gait; fast: 1.21 m/s with antalgic gait, knee buckling Goal status: INITIAL  3.  Patient will increase lower extremity functional scale to >60/80 to demonstrate improved functional mobility and increased tolerance with ADLs.  Baseline: 40/80 Goal status: INITIAL  4.  Patient will increase six minute walk test distance to >1069ft for progression to community level ambulation, demonstrating improved gait endurance. Baseline:  Goal status: INITIAL  5.  Patient will be able to complete stair navigation independently without UE support & significant increase in pain.  Baseline:  Goal status: INITIAL  6.  Pt will decrease worst knee pain as  reported on NPRS by at least 4 points in order to demonstrate clinically significant reduction in knee pain.  Baseline: 10/10 Goal status: INITIAL   PLAN:  PT FREQUENCY: 1-2x/week PT DURATION: 12 weeks PLANNED INTERVENTIONS: [id number]- PT Re-evaluation, 97750- Physical Performance Testing, 97110-Therapeutic exercises, 97530- Therapeutic activity, 97112- Neuromuscular re-education, 97535- Self Care, [id number]- Manual therapy, [phone]- Gait training, [phone]- Canalith repositioning, [phone]- Electrical stimulation (manual), [phone] (1-2 muscles), [id number] (3+ muscles)- Dry Needling, Patient/Family education, Balance training, Stair training, Joint mobilization, Scar mobilization, Cryotherapy, and Moist heat  PLAN FOR NEXT SESSION:  -LE strengthening - hip flexion, knee extension -isometrics for pain modulation as appropriate -manual therapy as appropriate  4:45 PM, 01/03/24 Chyrl London, PT Physical Therapist - [facility]  Outpatient Physical Therapy- [facility] [phone]

## 2024-01-04 ENCOUNTER — Other Ambulatory Visit: Payer: Self-pay

## 2024-01-04 NOTE — Telephone Encounter (Signed)
 Requested medications are due for refill today.  Unsure - new medication to this pt  Requested medications are on the active medications list.  yes  Last refill. 12/27/2023 #10 0 rf  Future visit scheduled.   no  Notes to clinic.  Please review for refill.    Requested Prescriptions  Pending Prescriptions Disp Refills   rizatriptan  (MAXALT -MLT) 10 MG disintegrating tablet [Pharmacy Med Name: RIZATRIPTAN  10 MG ODT] 10 tablet 0    Sig: Take 1 tablet (10 mg total) by mouth as needed. May repeat in 2 hours if needed     Neurology:  Migraine Therapy - Triptan Passed - 01/04/2024  2:12 PM      Passed - Last BP in normal range    BP Readings from Last 1 Encounters:  12/27/23 132/74         Passed - Valid encounter within last 12 months    Recent Outpatient Visits           1 week ago Acute non intractable tension-type headache   [place] [facility] [place], Marsa PARAS, DO   3 weeks ago Type 2 diabetes mellitus with other specified complication, without long-term current use of insulin [facility])   [place] [facility] [place], Marsa PARAS, DO   1 month ago Onychomycosis of left great toe   [facility] [place], Marsa PARAS, DO   4 months ago Other fatigue   [place] [facility] [facility], [place] W, NP   6 months ago Type 2 diabetes mellitus with other specified complication, without long-term current use of insulin [facility])    [facility] [place], Marsa PARAS, [place]

## 2024-01-07 ENCOUNTER — Encounter: Admitting: Physical Therapy

## 2024-01-07 ENCOUNTER — Ambulatory Visit

## 2024-01-07 DIAGNOSIS — R269 Unspecified abnormalities of gait and mobility: Secondary | ICD-10-CM

## 2024-01-07 DIAGNOSIS — M25562 Pain in left knee: Secondary | ICD-10-CM | POA: Diagnosis not present

## 2024-01-07 DIAGNOSIS — R2681 Unsteadiness on feet: Secondary | ICD-10-CM

## 2024-01-07 DIAGNOSIS — G8929 Other chronic pain: Secondary | ICD-10-CM

## 2024-01-07 DIAGNOSIS — M6281 Muscle weakness (generalized): Secondary | ICD-10-CM

## 2024-01-07 NOTE — Therapy (Signed)
 OUTPATIENT PHYSICAL THERAPY TREATMENT  Patient Name: Kelsey Mendoza MRN: [id number] DOB:1951/02/17, 73 y.o., female Today's Date: 01/07/2024  END OF SESSION:    PT End of Session - 01/07/24 0758     Visit Number 7    Number of Visits 24    Date for Recertification  03/06/24    Authorization Type HTA    Progress Note Due on Visit 10    PT Start Time 0758    PT Stop Time 0844    PT Time Calculation (min) 46 min    Equipment Utilized During Treatment Gait belt    Activity Tolerance Patient tolerated treatment well;No increased pain    Behavior During Therapy WFL for tasks assessed/performed           Past Medical History:  Diagnosis Date   Bladder cancer (HCC) 2000   resection x 3 and BCG therapy   CAD (coronary artery disease)    Chronic kidney disease, stage 3 (HCC)    Diastolic dysfunction    Diverticulosis    Fatigue    History of bilateral cataract extraction    HLD (hyperlipidemia)    HTN (hypertension)    Insomnia    Long-term current use of immunosuppressive biologic agent    a.) deucravacitinib   Obstructive sleep apnea with dependence on continuous positive airway pressure (CPAP)    Pericardial effusion    Primary osteoarthritis of left knee    Psoriasis of scalp    a.) Tx'd with deucravacitinib   Type 2 diabetes mellitus (HCC)    Past Surgical History:  Procedure Laterality Date   BLADDER SURGERY     x 3 resection bladder cancer   BROW LIFT Bilateral 05/27/2021   Procedure: BLEPHAROPLASTY UPPER EYELID; W/EXCESS SKIN AND BROW PTOSIS REPAIR BILATERAL;  Surgeon: Ashley Greig HERO, MD;  Location: [facility];  Service: Ophthalmology;  Laterality: Bilateral;   CARPAL TUNNEL RELEASE Right 06/06/2017   Procedure: CARPAL TUNNEL RELEASE;  Surgeon: Marchia Drivers, MD;  Location: [facility];  Service: Orthopedics;  Laterality: Right;   CATARACT EXTRACTION W/ INTRAOCULAR LENS  IMPLANT, BILATERAL Bilateral 08/2019   COLONOSCOPY  07/16/2012   EYE SURGERY  Left 11/26/2019   Dr. Elner, Vit, Mem Benedetto   I & D EXTREMITY Right 06/06/2017   Procedure: IRRIGATION AND DEBRIDEMENT EXTREMITY--RIGHT ARM;  Surgeon: Marchia Drivers, MD;  Location: [facility];  Service: Orthopedics;  Laterality: Right;   KNEE ARTHROSCOPY Left 10/06/2019   Procedure: ARTHROSCOPY KNEE,PARTIAL MEDIAL MENISECTOMY,PARTIAL CHONDROPLASTY;  Surgeon: Mardee Lynwood SQUIBB, MD;  Location: [facility];  Service: Orthopedics;  Laterality: Left;   LUMBAR SPINE SURGERY     MEDIAL PARTIAL KNEE REPLACEMENT Right 12/29/2016   Makoplasty   OPEN REDUCTION INTERNAL FIXATION (ORIF) DISTAL RADIAL FRACTURE N/A 06/06/2017   Procedure: OPEN REDUCTION INTERNAL FIXATION (ORIF) DISTAL RADIAL FRACTURE;  Surgeon: Marchia Drivers, MD;  Location: [facility];  Service: Orthopedics;  Laterality: N/A;   TOTAL KNEE ARTHROPLASTY Left 07/16/2023   Procedure: ARTHROPLASTY, KNEE, TOTAL;  Surgeon: Lorelle Hussar, MD;  Location: [facility];  Service: Orthopedics;  Laterality: Left;   VAGINAL HYSTERECTOMY     VITRECTOMY Bilateral    Patient Active Problem List   Diagnosis Date Noted   S/P TKR (total knee replacement), left 07/16/2023   Primary osteoarthritis of left knee 06/11/2023   Elevated TSH 04/18/2022   Benign hypertension with CKD (chronic kidney disease) stage III (HCC) 01/17/2022   Hyperlipidemia associated with type 2 diabetes mellitus (HCC) 01/17/2022   OSA on CPAP  06/13/2021   Posterior capsular opacification, right 03/25/2020   Postoperative follow-up 03/18/2020   Left posterior capsular opacification 02/12/2020   Left epiretinal membrane 11/10/2019   Right epiretinal membrane 11/10/2019   Cystoid macular edema of left eye 11/10/2019   Glaucoma suspect, bilateral 11/10/2019   Cancer (HCC) 10/04/2019   Closed Colles' fracture 07/03/2017   Distal radius fracture, right 06/05/2017   Contusion of left knee 11/27/2016   Contusion of right knee 11/27/2016   Depression 03/26/2014   Depressed affect 09/25/2013    Difficulty sleeping 09/25/2013   Type 2 diabetes mellitus with other specified complication (HCC) 09/03/2013   Diverticular disease 07/17/2012   Diverticulosis of colon 07/17/2012   Arthritis 07/14/2012   Bladder cancer (HCC) 07/14/2012    PCP: Marsa Officer, DO REFERRING PROVIDER: Arthea Sheer, MD REFERRING DIAG: M70.52 (ICD-10-CM) - Other bursitis of knee, left knee  THERAPY DIAG:   Chronic pain of left knee  Muscle weakness (generalized)  Unsteadiness on feet  Abnormality of gait  Rationale for Evaluation and Treatment: Rehabilitation ONSET DATE: L TKA 07/16/23; knee pain noted at physician visit 10/26/23.   SUBJECTIVE:   SUBJECTIVE STATEMENT:  Reports head is some better and rates knee pain at 2/10 today.     PERTINENT HISTORY: The patient had been doing fantastic after her left total knee replacement. She has developed some pes anserine bursitis and hamstring tendinitis after increasing her activities. There is no instability or increased motion in flexion on her exam. We discussed treatment options for the tendinitis including topical medications, local steroid injections, bracing, physical therapy and home exercises. Patient would like to try some anti-inflammatories and a local injection and do some physical therapy for the knee. Will also give her a brace to help calm down the inflammation. We will see how she does with these interventions and have her follow-up in a few months for repeat evaluation. All questions answered she agrees with above plan. PMH: DM, HTN, CKD, hyperlipidemia, obesity, OA, OSA on CPAP, hx of bladder cancer, insomnia  PAIN:  Are you having pain? 1/10 kne pain today; 3/10 HA today  PRECAUTIONS: None  WEIGHT BEARING RESTRICTIONS: No  FALLS:  Has patient fallen in last 6 months? No  LIVING ENVIRONMENT: Lives with: lives alone Lives in: House/apartment Stairs: No Has following equipment at home: Vannie - 2 wheeled, Tour manager,  Grab bars, and raised commode  OCCUPATION: retired Lexicographer, former LPN  PLOF: Independent  PATIENT GOALS:  reduce knee pain; get stronger; would like to be more active, be able to walk dog   OBJECTIVE:  Note: Objective measures were completed at Evaluation unless otherwise noted.  DIAGNOSTIC FINDINGS:  Per ortho MD note 11/28/23: 3 view x-rays AP, lateral, sunrise of the left knee ordered and taken today in clinic and images reviewed by myself show status post total knee arthroplasty with components in appropriate position. Patella tracking midline. No evidence of periprosthetic loosening or fracture.    LOWER EXTREMITY MMT:  MMT Right eval Left eval  Hip flexion 4- 3+*  Hip extension    Hip abduction 4+ 4+  Hip adduction 4+ 4+  Hip internal rotation    Hip external rotation    Knee flexion 4+ 4-  Knee extension 4+ 4-  Ankle dorsiflexion 4- 4-  Ankle plantarflexion 5 5   (Blank rows = not tested) *=pain reported by pt  FUNCTIONAL TESTS:  5 times sit to stand: 18.19 sec Timed up and go (TUG): 10.23 sec 10 meter  walk test: average normal walking speed: 1.11 m/s; average fast walking speed: 1.21 m/s                                                                                                                              TREATMENT DATE: 01/07/2024    THEREX:  Fwd lunge squat onto 5 block with UE support x 10 reps alt LE (No pain)  Rwd alt LE mini lunges at support x 15 reps  Resistive hip abd walk at support bar with GTB- down and back x  Seated ham curls 2 x 10 reps with GTB Seated Hip IR -  (squeeze knees into pillow) while moving feet apart with GTB around ankles x 15 reps      TA:  Step up with BUE Support onto 5 block x 12 reps each LE Lateral step up/down on 5 block x 12 reps each  Sit to stand without UE support and ball squeeze at knees x 10 reps Nustep - interval training to promote LE strength/cardioresp endurance. PT sets up  intervention, adjusts intensity throughout and monitors pt for response. Cuing for SPM/speed, pt maintains SPM in 60s. Patient performed with LE only at L1-3 for 5 total Min- Distance = 0.19 mi          PATIENT EDUCATION:  Education details: POC as related to future sessions, implementation of HEP at next session Person educated: Patient Education method: Explanation Education comprehension: verbalized understanding and needs further education  HOME EXERCISE PROGRAM: Access Code: [email or web address] URL: [email or web address] Date: 12/20/2023 Prepared by: Sidra Simpers  Exercises - Supine Bridge  - 1 x daily - 7 x weekly - 3 sets - 10 reps - Supine Active Straight Leg Raise  - 1 x daily - 7 x weekly - 3 sets - 10 reps - Supine Hip Adduction Isometric with Ball  - 1 x daily - 7 x weekly - 3 sets - 10 reps - Seated Quad Set  - 1 x daily - 7 x weekly - 3 sets - 10 reps - Hooklying Abduction with Resistance  - 1 x daily - 7 x weekly - 3 sets - 10 reps  ASSESSMENT:  CLINICAL IMPRESSION: Patient responded better overall last couple of sessions with no pain during session and able to progress to more closed chained LE strengthening without difficulty. Patient also responded favorably to adding Nustep today with good ability to focus on LE and progress with some resistance without pain.  Pt will continue to benefit from skilled therapy to address remaining deficits in order to improve overall QoL and return to PLOF.    OBJECTIVE IMPAIRMENTS: Abnormal gait, decreased activity tolerance, decreased balance, decreased knowledge of condition, decreased mobility, difficulty walking, decreased ROM, decreased strength, increased edema, increased fascial restrictions, and pain.   ACTIVITY LIMITATIONS: lifting, bending, sitting, standing, squatting, sleeping, stairs, transfers, bed mobility, dressing, and locomotion level  PARTICIPATION LIMITATIONS: cleaning, laundry, and community  activity  PERSONAL  FACTORS: Age, Fitness, Sex, Time since onset of injury/illness/exacerbation, and 3+ comorbidities: DM, HTN, CKD, hyperlipidemia, obesity, OA, OSA on CPAP, hx of bladder cancer, insomnia are also affecting patient's functional outcome.  REHAB POTENTIAL: Good CLINICAL DECISION MAKING: Stable/uncomplicated EVALUATION COMPLEXITY: Low   GOALS: Goals reviewed with patient? Yes  SHORT TERM GOALS: Target date: 01/25/24 Patient will be independent with home exercise program to improve strength/mobility for increased functional independence with ADLs and mobility. Baseline: to be established at future visit  Goal status: INITIAL  LONG TERM GOALS: Target date: 03/07/24  Patient will complete five times sit to stand test (5XSTS) in 12.8 seconds indicating an increased LE strength and improved balance & match age-based norms. Baseline: 18.19 sec Goal status: INITIAL  2.  Patient will increase 10 meter walk test to >1.77m/s without antalgic gait or knee buckling as to improve gait speed for better community ambulation and to reduce fall risk. Baseline: normal: 1.11 m/s with antalgic gait; fast: 1.21 m/s with antalgic gait, knee buckling Goal status: INITIAL  3.  Patient will increase lower extremity functional scale to >60/80 to demonstrate improved functional mobility and increased tolerance with ADLs.  Baseline: 40/80 Goal status: INITIAL  4.  Patient will increase six minute walk test distance to >1062ft for progression to community level ambulation, demonstrating improved gait endurance. Baseline:  Goal status: INITIAL  5.  Patient will be able to complete stair navigation independently without UE support & significant increase in pain.  Baseline:  Goal status: INITIAL  6.  Pt will decrease worst knee pain as reported on NPRS by at least 4 points in order to demonstrate clinically significant reduction in knee pain.  Baseline: 10/10 Goal status:  INITIAL   PLAN:  PT FREQUENCY: 1-2x/week PT DURATION: 12 weeks PLANNED INTERVENTIONS: [id number]- PT Re-evaluation, 97750- Physical Performance Testing, 97110-Therapeutic exercises, 97530- Therapeutic activity, 97112- Neuromuscular re-education, 97535- Self Care, [id number]- Manual therapy, [phone]- Gait training, [phone]- Canalith repositioning, [id number]- Electrical stimulation (manual), [phone] (1-2 muscles), [id number] (3+ muscles)- Dry Needling, Patient/Family education, Balance training, Stair training, Joint mobilization, Scar mobilization, Cryotherapy, and Moist heat  PLAN FOR NEXT SESSION:  -LE strengthening - hip flexion, knee extension -isometrics for pain modulation as appropriate -manual therapy as appropriate  8:47 AM, 01/07/24 Chyrl London, PT Physical Therapist - [facility]  Outpatient Physical Therapy- [facility] [phone]

## 2024-01-08 ENCOUNTER — Encounter

## 2024-01-10 ENCOUNTER — Ambulatory Visit: Attending: Orthopedic Surgery | Admitting: Physical Therapy

## 2024-01-10 DIAGNOSIS — G8929 Other chronic pain: Secondary | ICD-10-CM | POA: Insufficient documentation

## 2024-01-10 DIAGNOSIS — M6281 Muscle weakness (generalized): Secondary | ICD-10-CM | POA: Insufficient documentation

## 2024-01-10 DIAGNOSIS — R269 Unspecified abnormalities of gait and mobility: Secondary | ICD-10-CM | POA: Diagnosis not present

## 2024-01-10 DIAGNOSIS — M25562 Pain in left knee: Secondary | ICD-10-CM | POA: Insufficient documentation

## 2024-01-10 DIAGNOSIS — R2681 Unsteadiness on feet: Secondary | ICD-10-CM | POA: Diagnosis not present

## 2024-01-10 NOTE — Therapy (Signed)
 OUTPATIENT PHYSICAL THERAPY TREATMENT  Patient Name: Kelsey Mendoza MRN: [id number] DOB:30-Jul-1950, 73 y.o., female Today's Date: 01/10/2024  END OF SESSION:   PT End of Session - 01/10/24 0835     Visit Number 8    Number of Visits 24    Date for Recertification  03/06/24    Authorization Type HTA    Progress Note Due on Visit 10    PT Start Time 0842    PT Stop Time 0925    PT Time Calculation (min) 43 min    Equipment Utilized During Treatment Gait belt    Activity Tolerance Patient tolerated treatment well;No increased pain    Behavior During Therapy WFL for tasks assessed/performed            Past Medical History:  Diagnosis Date   Bladder cancer (HCC) 2000   resection x 3 and BCG therapy   CAD (coronary artery disease)    Chronic kidney disease, stage 3 (HCC)    Diastolic dysfunction    Diverticulosis    Fatigue    History of bilateral cataract extraction    HLD (hyperlipidemia)    HTN (hypertension)    Insomnia    Long-term current use of immunosuppressive biologic agent    a.) deucravacitinib   Obstructive sleep apnea with dependence on continuous positive airway pressure (CPAP)    Pericardial effusion    Primary osteoarthritis of left knee    Psoriasis of scalp    a.) Tx'd with deucravacitinib   Type 2 diabetes mellitus (HCC)    Past Surgical History:  Procedure Laterality Date   BLADDER SURGERY     x 3 resection bladder cancer   BROW LIFT Bilateral 05/27/2021   Procedure: BLEPHAROPLASTY UPPER EYELID; W/EXCESS SKIN AND BROW PTOSIS REPAIR BILATERAL;  Surgeon: Ashley Greig HERO, MD;  Location: [facility];  Service: Ophthalmology;  Laterality: Bilateral;   CARPAL TUNNEL RELEASE Right 06/06/2017   Procedure: CARPAL TUNNEL RELEASE;  Surgeon: Marchia Drivers, MD;  Location: [facility];  Service: Orthopedics;  Laterality: Right;   CATARACT EXTRACTION W/ INTRAOCULAR LENS  IMPLANT, BILATERAL Bilateral 08/2019   COLONOSCOPY  07/16/2012   EYE SURGERY  Left 11/26/2019   Dr. Elner, Vit, Mem Benedetto   I & D EXTREMITY Right 06/06/2017   Procedure: IRRIGATION AND DEBRIDEMENT EXTREMITY--RIGHT ARM;  Surgeon: Marchia Drivers, MD;  Location: [facility];  Service: Orthopedics;  Laterality: Right;   KNEE ARTHROSCOPY Left 10/06/2019   Procedure: ARTHROSCOPY KNEE,PARTIAL MEDIAL MENISECTOMY,PARTIAL CHONDROPLASTY;  Surgeon: Mardee Lynwood SQUIBB, MD;  Location: [facility];  Service: Orthopedics;  Laterality: Left;   LUMBAR SPINE SURGERY     MEDIAL PARTIAL KNEE REPLACEMENT Right 12/29/2016   Makoplasty   OPEN REDUCTION INTERNAL FIXATION (ORIF) DISTAL RADIAL FRACTURE N/A 06/06/2017   Procedure: OPEN REDUCTION INTERNAL FIXATION (ORIF) DISTAL RADIAL FRACTURE;  Surgeon: Marchia Drivers, MD;  Location: [facility];  Service: Orthopedics;  Laterality: N/A;   TOTAL KNEE ARTHROPLASTY Left 07/16/2023   Procedure: ARTHROPLASTY, KNEE, TOTAL;  Surgeon: Lorelle Hussar, MD;  Location: [facility];  Service: Orthopedics;  Laterality: Left;   VAGINAL HYSTERECTOMY     VITRECTOMY Bilateral    Patient Active Problem List   Diagnosis Date Noted   S/P TKR (total knee replacement), left 07/16/2023   Primary osteoarthritis of left knee 06/11/2023   Elevated TSH 04/18/2022   Benign hypertension with CKD (chronic kidney disease) stage III (HCC) 01/17/2022   Hyperlipidemia associated with type 2 diabetes mellitus (HCC) 01/17/2022   OSA on CPAP  06/13/2021   Posterior capsular opacification, right 03/25/2020   Postoperative follow-up 03/18/2020   Left posterior capsular opacification 02/12/2020   Left epiretinal membrane 11/10/2019   Right epiretinal membrane 11/10/2019   Cystoid macular edema of left eye 11/10/2019   Glaucoma suspect, bilateral 11/10/2019   Cancer (HCC) 10/04/2019   Closed Colles' fracture 07/03/2017   Distal radius fracture, right 06/05/2017   Contusion of left knee 11/27/2016   Contusion of right knee 11/27/2016   Depression 03/26/2014   Depressed affect 09/25/2013    Difficulty sleeping 09/25/2013   Type 2 diabetes mellitus with other specified complication (HCC) 09/03/2013   Diverticular disease 07/17/2012   Diverticulosis of colon 07/17/2012   Arthritis 07/14/2012   Bladder cancer (HCC) 07/14/2012    PCP: Marsa Officer, DO REFERRING PROVIDER: Arthea Sheer, MD REFERRING DIAG: M70.52 (ICD-10-CM) - Other bursitis of knee, left knee  THERAPY DIAG:   Chronic pain of left knee  Muscle weakness (generalized)  Unsteadiness on feet  Abnormality of gait  Rationale for Evaluation and Treatment: Rehabilitation ONSET DATE: L TKA 07/16/23; knee pain noted at physician visit 10/26/23.   SUBJECTIVE:   SUBJECTIVE STATEMENT:   Pt reporting that she had a fall on Monday morning where she tripped over a dog bed & fell prior to previous PT appointment; hit her L elbow, hit her nose on the door jam, & L knee. Pt states that her knee has started to improve since Monday, but her nose feels like it is getting worse, potentially to having to wear CPAP which is irritating the area. Noted localized swelling & laceration at the nose. Pt educated on options for having area looked at if continued pain & worsening, to include primary care, ENT.   Pt reports knee pain at 4/10 today, noting difficulty with knee flexion. Pt state that trying advil  helps to some degree.  Pt reports that she has had HA since 12/22/23; saw MD, told to trial tylenol  & advil , rizatriptan , & sudafed. Rizatriptan  helps when it's more severe, but doesn't take it away completely. Plans to take another sudafed when she gets home. Hx of migraines in her 30's. No issues w/ lights or noise currently, which previously bothered her with her migraines. HA used to be managed by internist in her 30's.     PERTINENT HISTORY: The patient had been doing fantastic after her left total knee replacement. She has developed some pes anserine bursitis and hamstring tendinitis after increasing her activities.  There is no instability or increased motion in flexion on her exam. We discussed treatment options for the tendinitis including topical medications, local steroid injections, bracing, physical therapy and home exercises. Patient would like to try some anti-inflammatories and a local injection and do some physical therapy for the knee. Will also give her a brace to help calm down the inflammation. We will see how she does with these interventions and have her follow-up in a few months for repeat evaluation. All questions answered she agrees with above plan. PMH: DM, HTN, CKD, hyperlipidemia, obesity, OA, OSA on CPAP, hx of bladder cancer, insomnia  PAIN:  Are you having pain? 1/10 kne pain today; 3/10 HA today  PRECAUTIONS: None  WEIGHT BEARING RESTRICTIONS: No  FALLS:  Has patient fallen in last 6 months? No  LIVING ENVIRONMENT: Lives with: lives alone Lives in: House/apartment Stairs: No Has following equipment at home: Vannie - 2 wheeled, Tour manager, Grab bars, and raised commode  OCCUPATION: retired Lexicographer, former LPN  PLOF: Independent  PATIENT GOALS:  reduce knee pain; get stronger; would like to be more active, be able to walk dog   OBJECTIVE:  Note: Objective measures were completed at Evaluation unless otherwise noted.  DIAGNOSTIC FINDINGS:  Per ortho MD note 11/28/23: 3 view x-rays AP, lateral, sunrise of the left knee ordered and taken today in clinic and images reviewed by myself show status post total knee arthroplasty with components in appropriate position. Patella tracking midline. No evidence of periprosthetic loosening or fracture.    LOWER EXTREMITY MMT:  MMT Right eval Left eval  Hip flexion 4- 3+*  Hip extension    Hip abduction 4+ 4+  Hip adduction 4+ 4+  Hip internal rotation    Hip external rotation    Knee flexion 4+ 4-  Knee extension 4+ 4-  Ankle dorsiflexion 4- 4-  Ankle plantarflexion 5 5   (Blank rows = not tested) *=pain  reported by pt  FUNCTIONAL TESTS:  5 times sit to stand: 18.19 sec Timed up and go (TUG): 10.23 sec 10 meter walk test: average normal walking speed: 1.11 m/s; average fast walking speed: 1.21 m/s                                                                                                                              TREATMENT DATE: 01/10/2024 Pt arrives to session with increased antalgic gait.   Unless otherwise noted, CGA provided & gait belt donned for pt safety.  STM for pain modulation to L knee Noted tenderness distal to the patella still reporting pain at same level, but feels better Of note, some minor bruising and swelling noted in the area   Gait 300' for warm up  improved antalgic gait noted compared to beginning of session.  STS 2x10 without UE support Progressed intervention, added 5# on 2nd set verbal cueing for eccentric control  Step ups with aerobic step + 1 set of purple risers, x10 each LE w/ BUE support at support bar only for balance Pt reporting low exertion with activity, therefore progressed to below intervention.  Step ups at stairs; 2x10 each LE; 1 LE on 1st step, other tapping at 3rd step  progressed from BUE support to no UE support throughout intervention Pt reporting some fear initially w/ intervention that improved with increased repetitions Increased difficulty w/ L LE as stance leg compared to R LE  Lunges x10 each LE with 1 UE support at support bar for balance Reporting increased difficulty w/ L LE leading, slight decrease in depth compared to R side leading.   Pt denies increase in pain with above therapeutic activity.      PATIENT EDUCATION:  Education details: POC as related to future sessions, implementation of HEP at next session Person educated: Patient Education method: Explanation Education comprehension: verbalized understanding and needs further education  HOME EXERCISE PROGRAM: Access Code: [email or web address] URL:  [email or web address] Date: 12/20/2023 Prepared by: Sidra Simpers  Exercises - Supine Bridge  -  1 x daily - 7 x weekly - 3 sets - 10 reps - Supine Active Straight Leg Raise  - 1 x daily - 7 x weekly - 3 sets - 10 reps - Supine Hip Adduction Isometric with Ball  - 1 x daily - 7 x weekly - 3 sets - 10 reps - Seated Quad Set  - 1 x daily - 7 x weekly - 3 sets - 10 reps - Hooklying Abduction with Resistance  - 1 x daily - 7 x weekly - 3 sets - 10 reps  ASSESSMENT:  CLINICAL IMPRESSION:   Patient reporting some increase in knee pain compared to previous sessions secondary to fall on Monday. Pt benefited from [facility] for pain modulation, as evidenced by decreased antalgic gait following manual therapy. Pt able to progress step ups this date to stepping at stairs up to 3rd step from BUE support to no UE support. Pt reporting increased difficulty with LLE compared to RLE both with stair stepping & lunges with 1 UE support at support bar. Pt will continue to benefit from skilled therapy to address remaining deficits in order to improve overall QoL and return to PLOF.    OBJECTIVE IMPAIRMENTS: Abnormal gait, decreased activity tolerance, decreased balance, decreased knowledge of condition, decreased mobility, difficulty walking, decreased ROM, decreased strength, increased edema, increased fascial restrictions, and pain.   ACTIVITY LIMITATIONS: lifting, bending, sitting, standing, squatting, sleeping, stairs, transfers, bed mobility, dressing, and locomotion level  PARTICIPATION LIMITATIONS: cleaning, laundry, and community activity  PERSONAL FACTORS: Age, Fitness, Sex, Time since onset of injury/illness/exacerbation, and 3+ comorbidities: DM, HTN, CKD, hyperlipidemia, obesity, OA, OSA on CPAP, hx of bladder cancer, insomnia are also affecting patient's functional outcome.  REHAB POTENTIAL: Good CLINICAL DECISION MAKING: Stable/uncomplicated EVALUATION COMPLEXITY: Low   GOALS: Goals  reviewed with patient? Yes  SHORT TERM GOALS: Target date: 01/25/24 Patient will be independent with home exercise program to improve strength/mobility for increased functional independence with ADLs and mobility. Baseline: to be established at future visit  Goal status: INITIAL  LONG TERM GOALS: Target date: 03/07/24  Patient will complete five times sit to stand test (5XSTS) in 12.8 seconds indicating an increased LE strength and improved balance & match age-based norms. Baseline: 18.19 sec Goal status: INITIAL  2.  Patient will increase 10 meter walk test to >1.66m/s without antalgic gait or knee buckling as to improve gait speed for better community ambulation and to reduce fall risk. Baseline: normal: 1.11 m/s with antalgic gait; fast: 1.21 m/s with antalgic gait, knee buckling Goal status: INITIAL  3.  Patient will increase lower extremity functional scale to >60/80 to demonstrate improved functional mobility and increased tolerance with ADLs.  Baseline: 40/80 Goal status: INITIAL  4.  Patient will increase six minute walk test distance to >1029ft for progression to community level ambulation, demonstrating improved gait endurance. Baseline:  Goal status: INITIAL  5.  Patient will be able to complete stair navigation independently without UE support & significant increase in pain.  Baseline:  Goal status: INITIAL  6.  Pt will decrease worst knee pain as reported on NPRS by at least 4 points in order to demonstrate clinically significant reduction in knee pain.  Baseline: 10/10 Goal status: INITIAL   PLAN:  PT FREQUENCY: 1-2x/week PT DURATION: 12 weeks PLANNED INTERVENTIONS: [id number]- PT Re-evaluation, 97750- Physical Performance Testing, 97110-Therapeutic exercises, 97530- Therapeutic activity, [id number]- Neuromuscular re-education, 97535- Self Care, [id number]- Manual therapy, [id number]- Gait training, [phone]- Canalith repositioning, [id number]- Electrical stimulation (manual), [phone] (1-2  muscles), [id number] (3+ muscles)- Dry Needling, Patient/Family education, Balance training, Stair training, Joint mobilization, Scar mobilization, Cryotherapy, and Moist heat  PLAN FOR NEXT SESSION:   -LE strengthening - hip flexion, knee extension -isometrics for pain modulation as appropriate -manual therapy as appropriate  9:31 AM, 01/10/24 Chiquita Silvan, SPT Physical Therapy Student - [place]  [facility]

## 2024-01-14 ENCOUNTER — Ambulatory Visit

## 2024-01-14 DIAGNOSIS — G8929 Other chronic pain: Secondary | ICD-10-CM

## 2024-01-14 DIAGNOSIS — M25562 Pain in left knee: Secondary | ICD-10-CM | POA: Diagnosis not present

## 2024-01-14 DIAGNOSIS — M6281 Muscle weakness (generalized): Secondary | ICD-10-CM

## 2024-01-14 NOTE — Therapy (Signed)
 OUTPATIENT PHYSICAL THERAPY TREATMENT/REASSESSMENT  Patient Name: Kelsey Mendoza MRN: [id number] DOB:[birth year], 73 y.o., female Today's Date: 01/14/2024  END OF SESSION:   PT End of Session - 01/14/24 1155     Visit Number 9    Number of Visits 24    Date for Recertification  03/06/24    Authorization Type HTA    Progress Note Due on Visit 10    PT Start Time 1150    PT Stop Time 1230    PT Time Calculation (min) 40 min    Activity Tolerance Patient tolerated treatment well;No increased pain    Behavior During Therapy WFL for tasks assessed/performed            Past Medical History:  Diagnosis Date   Bladder cancer (HCC) 2000   resection x 3 and BCG therapy   CAD (coronary artery disease)    Chronic kidney disease, stage 3 (HCC)    Diastolic dysfunction    Diverticulosis    Fatigue    History of bilateral cataract extraction    HLD (hyperlipidemia)    HTN (hypertension)    Insomnia    Long-term current use of immunosuppressive biologic agent    a.) deucravacitinib   Obstructive sleep apnea with dependence on continuous positive airway pressure (CPAP)    Pericardial effusion    Primary osteoarthritis of left knee    Psoriasis of scalp    a.) Tx'd with deucravacitinib   Type 2 diabetes mellitus (HCC)    Past Surgical History:  Procedure Laterality Date   BLADDER SURGERY     x 3 resection bladder cancer   BROW LIFT Bilateral 05/27/2021   Procedure: BLEPHAROPLASTY UPPER EYELID; W/EXCESS SKIN AND BROW PTOSIS REPAIR BILATERAL;  Surgeon: Ashley Greig HERO, MD;  Location: [facility];  Service: Ophthalmology;  Laterality: Bilateral;   CARPAL TUNNEL RELEASE Right 06/06/2017   Procedure: CARPAL TUNNEL RELEASE;  Surgeon: Marchia Drivers, MD;  Location: [facility];  Service: Orthopedics;  Laterality: Right;   CATARACT EXTRACTION W/ INTRAOCULAR LENS  IMPLANT, BILATERAL Bilateral 08/2019   COLONOSCOPY  07/16/2012   EYE SURGERY Left 11/26/2019   Dr. Elner, Vit, Mem  Benedetto   I & D EXTREMITY Right 06/06/2017   Procedure: IRRIGATION AND DEBRIDEMENT EXTREMITY--RIGHT ARM;  Surgeon: Marchia Drivers, MD;  Location: [facility];  Service: Orthopedics;  Laterality: Right;   KNEE ARTHROSCOPY Left 10/06/2019   Procedure: ARTHROSCOPY KNEE,PARTIAL MEDIAL MENISECTOMY,PARTIAL CHONDROPLASTY;  Surgeon: Mardee Lynwood SQUIBB, MD;  Location: [facility];  Service: Orthopedics;  Laterality: Left;   LUMBAR SPINE SURGERY     MEDIAL PARTIAL KNEE REPLACEMENT Right 12/29/2016   Makoplasty   OPEN REDUCTION INTERNAL FIXATION (ORIF) DISTAL RADIAL FRACTURE N/A 06/06/2017   Procedure: OPEN REDUCTION INTERNAL FIXATION (ORIF) DISTAL RADIAL FRACTURE;  Surgeon: Marchia Drivers, MD;  Location: [facility];  Service: Orthopedics;  Laterality: N/A;   TOTAL KNEE ARTHROPLASTY Left 07/16/2023   Procedure: ARTHROPLASTY, KNEE, TOTAL;  Surgeon: Lorelle Hussar, MD;  Location: [facility];  Service: Orthopedics;  Laterality: Left;   VAGINAL HYSTERECTOMY     VITRECTOMY Bilateral    Patient Active Problem List   Diagnosis Date Noted   S/P TKR (total knee replacement), left 07/16/2023   Primary osteoarthritis of left knee 06/11/2023   Elevated TSH 04/18/2022   Benign hypertension with CKD (chronic kidney disease) stage III (HCC) 01/17/2022   Hyperlipidemia associated with type 2 diabetes mellitus (HCC) 01/17/2022   OSA on CPAP 06/13/2021   Posterior capsular opacification, right 03/25/2020  Postoperative follow-up 03/18/2020   Left posterior capsular opacification 02/12/2020   Left epiretinal membrane 11/10/2019   Right epiretinal membrane 11/10/2019   Cystoid macular edema of left eye 11/10/2019   Glaucoma suspect, bilateral 11/10/2019   Cancer (HCC) 10/04/2019   Closed Colles' fracture 07/03/2017   Distal radius fracture, right 06/05/2017   Contusion of left knee 11/27/2016   Contusion of right knee 11/27/2016   Depression 03/26/2014   Depressed affect 09/25/2013   Difficulty sleeping 09/25/2013    Type 2 diabetes mellitus with other specified complication (HCC) 09/03/2013   Diverticular disease 07/17/2012   Diverticulosis of colon 07/17/2012   Arthritis 07/14/2012   Bladder cancer (HCC) 07/14/2012    PCP: Marsa Officer, DO REFERRING PROVIDER: Arthea Sheer, MD REFERRING DIAG: M70.52 (ICD-10-CM) - Other bursitis of knee, left knee  THERAPY DIAG:   Chronic pain of left knee  Muscle weakness (generalized)  Rationale for Evaluation and Treatment: Rehabilitation ONSET DATE: L TKA 07/16/23; knee pain noted at physician visit 10/26/23.   SUBJECTIVE:   SUBJECTIVE STATEMENT:    Pt says her knee conitnues to feel mostly pain free, but does feel weak which concerns her. Pt also asks abotu insidiou sonset Left hip/groin burning pain that began about 5 days prior.   PERTINENT HISTORY: The patient had been doing fantastic after her left total knee replacement. She has developed some pes anserine bursitis and hamstring tendinitis after increasing her activities. There is no instability or increased motion in flexion on her exam. We discussed treatment options for the tendinitis including topical medications, local steroid injections, bracing, physical therapy and home exercises. Patient would like to try some anti-inflammatories and a local injection and do some physical therapy for the knee. Will also give her a brace to help calm down the inflammation. We will see how she does with these interventions and have her follow-up in a few months for repeat evaluation. All questions answered she agrees with above plan. PMH: DM, HTN, CKD, hyperlipidemia, obesity, OA, OSA on CPAP, hx of bladder cancer, insomnia  PAIN:  Are you having pain? 0/10 Left knee; 6-7/10 Left groin burning  PRECAUTIONS: None  WEIGHT BEARING RESTRICTIONS: No  FALLS:  Has patient fallen in last 6 months? No  LIVING ENVIRONMENT: Lives with: lives alone Lives in: House/apartment Stairs: No Has following equipment  at home: Vannie - 2 wheeled, Tour manager, Grab bars, and raised commode  OCCUPATION: retired Lexicographer, former LPN  PLOF: Independent  PATIENT GOALS:  reduce knee pain; get stronger; would like to be more active, be able to walk dog   OBJECTIVE:  Note: Objective measures were completed at Evaluation unless otherwise noted.  DIAGNOSTIC FINDINGS:  Per ortho MD note 11/28/23: 3 view x-rays AP, lateral, sunrise of the left knee ordered and taken today in clinic and images reviewed by myself show status post total knee arthroplasty with components in appropriate position. Patella tracking midline. No evidence of periprosthetic loosening or fracture.    LOWER EXTREMITY MMT:  MMT Right eval Left eval  Hip flexion 4- 3+*  Hip extension    Hip abduction 4+ 4+  Hip adduction 4+ 4+  Hip internal rotation    Hip external rotation    Knee flexion 4+ 4-  Knee extension 4+ 4-  Ankle dorsiflexion 4- 4-  Ankle plantarflexion 5 5   (Blank rows = not tested) *=pain reported by pt  TREATMENT DATE: 01/14/2024 -femoral nerve tension test with neutral cervical spine and slacking maneuver: no symptoms change -supine palpation about Femoral cutaneous nerve, sensation screening: all WNL *education on progression of shingles and what to monitor to make sure this is not a shingles related issue -AA/ROM on Nustep x3 minutes, level 3, seat 7, arms 8 -5xSTS: 10.37sec hands free; 5xSTS: 7.99sec   - : 1423ft no device    PATIENT EDUCATION:  Education details: POC as related to future sessions, implementation of HEP at next session Person educated: Patient Education method: Explanation Education comprehension: verbalized understanding and needs further education  HOME EXERCISE PROGRAM: Access Code: [email or web address] URL: [email or web address] Date:  12/20/2023 Prepared by: Sidra Simpers  Exercises - Supine Bridge  - 1 x daily - 7 x weekly - 3 sets - 10 reps - Supine Active Straight Leg Raise  - 1 x daily - 7 x weekly - 3 sets - 10 reps - Supine Hip Adduction Isometric with Ball  - 1 x daily - 7 x weekly - 3 sets - 10 reps - Seated Quad Set  - 1 x daily - 7 x weekly - 3 sets - 10 reps - Hooklying Abduction with Resistance  - 1 x daily - 7 x weekly - 3 sets - 10 reps  ASSESSMENT:  CLINICAL IMPRESSION:   Reassessment today, fantastic progress toward goals overall.  Pt is above age matched norm values for 5xSTS, , and had made excellent progress on LEFS. Pt has anew left groin burning sensation that is mysterious, took time to educate pt about typical shingle presentation, encouraged her to monitor for any progress in pain and/or skin changes. Pt would like to continue to PT to feel stronger in the left knee- discussed use of dynamometry to better identify remaining deficits at next visit. Pt will continue to benefit from skilled therapy to address remaining deficits in order to improve overall QoL and return to PLOF.    OBJECTIVE IMPAIRMENTS: Abnormal gait, decreased activity tolerance, decreased balance, decreased knowledge of condition, decreased mobility, difficulty walking, decreased ROM, decreased strength, increased edema, increased fascial restrictions, and pain.   ACTIVITY LIMITATIONS: lifting, bending, sitting, standing, squatting, sleeping, stairs, transfers, bed mobility, dressing, and locomotion level  PARTICIPATION LIMITATIONS: cleaning, laundry, and community activity  PERSONAL FACTORS: Age, Fitness, Sex, Time since onset of injury/illness/exacerbation, and 3+ comorbidities: DM, HTN, CKD, hyperlipidemia, obesity, OA, OSA on CPAP, hx of bladder cancer, insomnia are also affecting patient's functional outcome.  REHAB POTENTIAL: Good CLINICAL DECISION MAKING: Stable/uncomplicated EVALUATION COMPLEXITY: Low   GOALS: Goals  reviewed with patient? Yes  SHORT TERM GOALS: Target date: 01/25/24 Patient will be independent with home exercise program to improve strength/mobility for increased functional independence with ADLs and mobility. Baseline: to be established at future visit  Goal status: INITIAL  LONG TERM GOALS: Target date: 03/07/24 Patient will complete five times sit to stand test (5XSTS) in 12.8 seconds indicating an increased LE strength and improved balance & match age-based norms. Baseline: 18.19 sec; -5xSTS: 10.37sec hands free; 5xSTS: 7.99sec;  Goal status: ACHIEVED  2.  Patient will increase 10 meter walk test to >1.20m/s without antalgic gait or knee buckling as to improve gait speed for better community ambulation and to reduce fall risk. Baseline: normal: 1.11 m/s with antalgic gait; fast: 1.21 m/s with antalgic gait, knee buckling Goal status: INITIAL  3.  Patient will increase lower extremity functional scale to >60/80 to demonstrate improved functional mobility and increased tolerance with ADLs.  Baseline: 40/80;  01/14/24: 84% Goal status: ACHIEVED  4.  Patient will increase six minute walk test distance to >1017ft for progression to community level ambulation, demonstrating improved gait endurance. Baseline: 01/14/24: 1475% Goal status: INITIAL  5.  Patient will be able to complete stair navigation independently without UE support & significant increase in pain.  Baseline:  Goal status: INITIAL  6.  Pt will decrease worst knee pain as reported on NPRS by at least 4 points in order to demonstrate clinically significant reduction in knee pain.  Baseline: 10/10 Goal status: INITIAL  PLAN:  PT FREQUENCY: 1-2x/week PT DURATION: 12 weeks PLANNED INTERVENTIONS: [id number]- PT Re-evaluation, 97750- Physical Performance Testing, 97110-Therapeutic exercises, 97530- Therapeutic activity, 97112- Neuromuscular re-education, 97535- Self Care, 02859- Manual therapy, [phone]- Gait training, [phone]-  Canalith repositioning, [phone]- Electrical stimulation (manual), [phone] (1-2 muscles), [phone] (3+ muscles)- Dry Needling, Patient/Family education, Balance training, Stair training, Joint mobilization, Scar mobilization, Cryotherapy, and Moist heat  PLAN FOR NEXT SESSION:   -LE strengthening - hip flexion, knee extension -isometrics for pain modulation as appropriate -manual therapy as appropriate  12:49 PM, 01/14/24 Peggye JAYSON Linear, PT, DPT Physical Therapist - [facility]  Outpatient Physical Therapy- [facility] [phone]

## 2024-01-16 ENCOUNTER — Ambulatory Visit

## 2024-01-16 ENCOUNTER — Ambulatory Visit: Payer: Self-pay

## 2024-01-16 VITALS — BP 126/74 | HR 66 | Ht 64.0 in | Wt 162.2 lb

## 2024-01-16 DIAGNOSIS — E1169 Type 2 diabetes mellitus with other specified complication: Secondary | ICD-10-CM

## 2024-01-16 DIAGNOSIS — G629 Polyneuropathy, unspecified: Secondary | ICD-10-CM

## 2024-01-16 NOTE — Progress Notes (Signed)
 Acute Patient Visit  Physician: Jarrius Huaracha A Laconya Clere, MD  Patient: Kelsey Mendoza MRN: [id number] DOB: [birth year] PCP: Edman Marsa JINNY, DO     Subjective:   Chief Complaint  Patient presents with   Acute Visit    Patients wants to be tested for shingles. States she is having inner thigh pain on her left thigh. States the pain started  1 week ago.     HPI: The patient is a 73 y.o. female who presents today for:   Discussed the use of AI scribe software for clinical note transcription with the patient, who gave verbal consent to proceed.  History of Present Illness   Kelsey Mendoza is a 73 year old female with diabetes who presents with leg pain.  Lower extremity pain - Burning sensation in the upper anterior leg for approximately one week, occasionally throbbing  - Skin is tender and uncomfortable, especially when rubbed by clothing - No associated rash - No recent leg injury - Pain does not worsen with movement - Pain does not disrupt sleep - Topical lidocaine  used without relief - Voltaren available at home but not yet used  Diabetes mellitus - last hgba1c 5.7 - Diabetes present - Does not regularly monitor blood glucose at home - No current symptoms of hyperglycemia or hypoglycemia - Eating well   Herpes zoster prevention - Received Shingrix vaccine in 2020 - No history of shingles          ROS:   As noted in the HPI  Physical Exam Vitals reviewed.  Constitutional:      Appearance: Normal appearance. Well-developed with normal weight.  Cardiovascular:     Rate and Rhythm: Normal rate and regular rhythm. Normal heart sounds. Normal peripheral pulses Pulmonary:     Normal breath sounds with normal effort Skin:    General: Skin is warm and dry without noticeable rash. Ant upper leg without pain with movement, mild skin tendernes, no inguinal LAD, no rash Neurological:     General: No focal deficit present.  Psychiatric:         Mood and Affect: Mood, behavior and cognition normal   ASSESMENT/PLAN:  Encounter Diagnoses  Name Primary?   Neuropathy Yes    No orders of the defined types were placed in this encounter.   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 Outside antiviral treatment window. Differential includes nerve compression. Expected slow resolution. - Advise topical Voltaren for pain. - Consider capsaicin for pain relief, caution burning sensation. - Discussed gabapentin , not recommended due to side effects. - Monitor symptoms, report if pain worsens or spreads. - Reassure slow resolution, allow another week.   Type 2 diabetes mellitus without complications Type 2 diabetes without complications, no regular blood sugar monitoring, reports feeling well.           OBJECTIVE: Vitals:   01/16/24 0843  BP: 126/74  Pulse: 66  SpO2: 96%  Weight: 162 lb 3.2 oz (73.6 kg)  Height: 5' 4 (1.626 m)    Body mass index is 27.84 kg/m.   Physical Exam Vitals reviewed.  Constitutional:      Appearance: Normal appearance. Well-developed with normal weight.  Cardiovascular:     Rate and Rhythm: Normal rate and regular rhythm. Normal heart sounds. Normal peripheral pulses Pulmonary:     Normal breath sounds with normal effort Skin:    General: Skin is warm and  dry without noticeable rash. Neurological:     General: No focal deficit present.  Psychiatric:        Mood and Affect: Mood, behavior and cognition normal       Allergies Patient is allergic to trazodone.  Past Medical History Patient  has a past medical history of Bladder cancer (HCC) (2000), CAD (coronary artery disease), Chronic kidney disease, stage 3 (HCC), Diastolic dysfunction, Diverticulosis, Fatigue, History of bilateral cataract extraction, HLD (hyperlipidemia), HTN (hypertension), Insomnia, Long-term current use of  immunosuppressive biologic agent, Obstructive sleep apnea with dependence on continuous positive airway pressure (CPAP), Pericardial effusion, Primary osteoarthritis of left knee, Psoriasis of scalp, and Type 2 diabetes mellitus (HCC).  Surgical History Patient  has a past surgical history that includes Lumbar spine surgery; Bladder surgery; Vaginal hysterectomy; Medial partial knee replacement (Right, 12/29/2016); Open reduction internal fixation (orif) distal radial fracture (N/A, 06/06/2017); Carpal tunnel release (Right, 06/06/2017); I & D extremity (Right, 06/06/2017); Knee arthroscopy (Left, 10/06/2019); Eye surgery (Left, 11/26/2019); Brow lift (Bilateral, 05/27/2021); Cataract extraction w/ intraocular lens  implant, bilateral (Bilateral, 08/2019); Vitrectomy (Bilateral); Colonoscopy (07/16/2012); and Total knee arthroplasty (Left, 07/16/2023).  Family History Pateint's family history includes CAD in her father; Hypertension in her father and mother.  Social History Patient  reports that she quit smoking about 40 years ago. Her smoking use included cigarettes. She has never used smokeless tobacco. She reports that she does not drink alcohol and does not use drugs.    01/16/2024

## 2024-01-16 NOTE — Telephone Encounter (Signed)
 FYI Only or Action Required?: Action required by provider: request for appointment.  Patient was last seen in primary care on 12/27/2023 by Edman Marsa PARAS, DO.  Called Nurse Triage reporting Leg Pain.  Symptoms began a week ago.  Interventions attempted: OTC medications: advil  .  Symptoms are: gradually worsening.  Triage Disposition: See HCP Within 4 Hours (Or PCP Triage)  Patient/caregiver understands and will follow disposition?: YesCopied from CRM #[id number]. Topic: Clinical - Red Word Triage >> Jan 16, 2024  8:09 AM Mia F wrote: Red Word that prompted transfer to Nurse Triage: Bad pain in left upper thigh that is burning and painful to the touch. No rash. Pain is worsening and feels like it is spreading. Going on for about a week. Has been having treatment for a bad headache. Concerned about shingles. Reason for Disposition  [1] SEVERE pain (e.g., excruciating, unable to do any normal activities) AND [2] not improved after 2 hours of pain medicine  Answer Assessment - Initial Assessment Questions Pain is from left groin left to upper thigh. No rash but pt thinks it is shingles. Pain is a throbbing sensation. Pt wants blood test to determine.      1. ONSET: When did the pain start?      1 week ago  2. LOCATION: Where is the pain located?      Left thigh 3. PAIN: How bad is the pain?    (Scale 1-10; or mild, moderate, severe)     8 4. WORK OR EXERCISE: Has there been any recent work or exercise that involved this part of the body?      na 5. CAUSE: What do you think is causing the leg pain?     Possible shingles 6. OTHER SYMPTOMS: Do you have any other symptoms? (e.g., chest pain, back pain, breathing difficulty, swelling, rash, fever, numbness, weakness)     headaches  Protocols used: Leg Pain-A-AH

## 2024-01-17 ENCOUNTER — Ambulatory Visit

## 2024-01-17 DIAGNOSIS — M25562 Pain in left knee: Secondary | ICD-10-CM | POA: Diagnosis not present

## 2024-01-17 DIAGNOSIS — M6281 Muscle weakness (generalized): Secondary | ICD-10-CM

## 2024-01-17 DIAGNOSIS — G8929 Other chronic pain: Secondary | ICD-10-CM

## 2024-01-17 DIAGNOSIS — R2681 Unsteadiness on feet: Secondary | ICD-10-CM

## 2024-01-17 DIAGNOSIS — R269 Unspecified abnormalities of gait and mobility: Secondary | ICD-10-CM

## 2024-01-17 DIAGNOSIS — B029 Zoster without complications: Secondary | ICD-10-CM | POA: Diagnosis not present

## 2024-01-17 NOTE — Therapy (Addendum)
 OUTPATIENT PHYSICAL THERAPY TREATMENT Physical Therapy Progress Note Dates of reporting period  12/13/23   to   01/17/24   Patient Name: Kelsey Mendoza MRN: [id number] DOB:[birth year], 73 y.o., female Today's Date: 01/17/2024  END OF SESSION:   PT End of Session - 01/17/24 0920     Visit Number 10    Number of Visits 24    Date for Recertification  03/06/24    Authorization Type HTA    Progress Note Due on Visit 10    PT Start Time 0847    PT Stop Time 0927    PT Time Calculation (min) 40 min    Activity Tolerance Patient tolerated treatment well;No increased pain    Behavior During Therapy WFL for tasks assessed/performed            Past Medical History:  Diagnosis Date   Bladder cancer (HCC) 2000   resection x 3 and BCG therapy   CAD (coronary artery disease)    Chronic kidney disease, stage 3 (HCC)    Diastolic dysfunction    Diverticulosis    Fatigue    History of bilateral cataract extraction    HLD (hyperlipidemia)    HTN (hypertension)    Insomnia    Long-term current use of immunosuppressive biologic agent    a.) deucravacitinib   Obstructive sleep apnea with dependence on continuous positive airway pressure (CPAP)    Pericardial effusion    Primary osteoarthritis of left knee    Psoriasis of scalp    a.) Tx'd with deucravacitinib   Type 2 diabetes mellitus (HCC)    Past Surgical History:  Procedure Laterality Date   BLADDER SURGERY     x 3 resection bladder cancer   BROW LIFT Bilateral 05/27/2021   Procedure: BLEPHAROPLASTY UPPER EYELID; W/EXCESS SKIN AND BROW PTOSIS REPAIR BILATERAL;  Surgeon: Ashley Greig HERO, MD;  Location: [facility];  Service: Ophthalmology;  Laterality: Bilateral;   CARPAL TUNNEL RELEASE Right 06/06/2017   Procedure: CARPAL TUNNEL RELEASE;  Surgeon: Marchia Drivers, MD;  Location: [facility];  Service: Orthopedics;  Laterality: Right;   CATARACT EXTRACTION W/ INTRAOCULAR LENS  IMPLANT, BILATERAL Bilateral 08/2019    COLONOSCOPY  07/16/2012   EYE SURGERY Left 11/26/2019   Dr. Elner, Vit, Mem Benedetto   I & D EXTREMITY Right 06/06/2017   Procedure: IRRIGATION AND DEBRIDEMENT EXTREMITY--RIGHT ARM;  Surgeon: Marchia Drivers, MD;  Location: [facility];  Service: Orthopedics;  Laterality: Right;   KNEE ARTHROSCOPY Left 10/06/2019   Procedure: ARTHROSCOPY KNEE,PARTIAL MEDIAL MENISECTOMY,PARTIAL CHONDROPLASTY;  Surgeon: Mardee Lynwood SQUIBB, MD;  Location: [facility];  Service: Orthopedics;  Laterality: Left;   LUMBAR SPINE SURGERY     MEDIAL PARTIAL KNEE REPLACEMENT Right 12/29/2016   Makoplasty   OPEN REDUCTION INTERNAL FIXATION (ORIF) DISTAL RADIAL FRACTURE N/A 06/06/2017   Procedure: OPEN REDUCTION INTERNAL FIXATION (ORIF) DISTAL RADIAL FRACTURE;  Surgeon: Marchia Drivers, MD;  Location: [facility];  Service: Orthopedics;  Laterality: N/A;   TOTAL KNEE ARTHROPLASTY Left 07/16/2023   Procedure: ARTHROPLASTY, KNEE, TOTAL;  Surgeon: Lorelle Hussar, MD;  Location: [facility];  Service: Orthopedics;  Laterality: Left;   VAGINAL HYSTERECTOMY     VITRECTOMY Bilateral    Patient Active Problem List   Diagnosis Date Noted   Neuropathy 01/16/2024   S/P TKR (total knee replacement), left 07/16/2023   Primary osteoarthritis of left knee 06/11/2023   Elevated TSH 04/18/2022   Benign hypertension with CKD (chronic kidney disease) stage III (HCC) 01/17/2022   Hyperlipidemia associated  with type 2 diabetes mellitus (HCC) 01/17/2022   OSA on CPAP 06/13/2021   Posterior capsular opacification, right 03/25/2020   Postoperative follow-up 03/18/2020   Left posterior capsular opacification 02/12/2020   Left epiretinal membrane 11/10/2019   Right epiretinal membrane 11/10/2019   Cystoid macular edema of left eye 11/10/2019   Glaucoma suspect, bilateral 11/10/2019   Cancer (HCC) 10/04/2019   Closed Colles' fracture 07/03/2017   Distal radius fracture, right 06/05/2017   Contusion of left knee 11/27/2016   Contusion of right knee  11/27/2016   Depression 03/26/2014   Depressed affect 09/25/2013   Difficulty sleeping 09/25/2013   Type 2 diabetes mellitus with other specified complication (HCC) 09/03/2013   Diverticular disease 07/17/2012   Diverticulosis of colon 07/17/2012   Arthritis 07/14/2012   Bladder cancer (HCC) 07/14/2012    PCP: Marsa Officer, DO REFERRING PROVIDER: Arthea Sheer, MD REFERRING DIAG: M70.52 (ICD-10-CM) - Other bursitis of knee, left knee  THERAPY DIAG:   Chronic pain of left knee  Muscle weakness (generalized)  Unsteadiness on feet  Abnormality of gait  Rationale for Evaluation and Treatment: Rehabilitation ONSET DATE: L TKA 07/16/23; knee pain noted at physician visit 10/26/23.   SUBJECTIVE:   SUBJECTIVE STATEMENT:    Left anterior thigh/groin pain has continued to worsen, no skin changes. Pt is frustrated. She went to PCP office and they would not do a shingles blood test that she asked about. She went to UC this morning, but left before being seen so she wouldn't be late here.    PERTINENT HISTORY: The patient had been doing fantastic after her left total knee replacement. She has developed some pes anserine bursitis and hamstring tendinitis after increasing her activities. There is no instability or increased motion in flexion on her exam. We discussed treatment options for the tendinitis including topical medications, local steroid injections, bracing, physical therapy and home exercises. Patient would like to try some anti-inflammatories and a local injection and do some physical therapy for the knee. Will also give her a brace to help calm down the inflammation. We will see how she does with these interventions and have her follow-up in a few months for repeat evaluation. All questions answered she agrees with above plan. PMH: DM, HTN, CKD, hyperlipidemia, obesity, OA, OSA on CPAP, hx of bladder cancer, insomnia  PAIN:  Are you having pain? 0/10 Left knee; 8/10 Left  groin burning  PRECAUTIONS: None  WEIGHT BEARING RESTRICTIONS: No  FALLS:  Has patient fallen in last 6 months? No  LIVING ENVIRONMENT: Lives with: lives alone Lives in: House/apartment Stairs: No Has following equipment at home: Vannie - 2 wheeled, Tour manager, Grab bars, and raised commode  OCCUPATION: retired Lexicographer, former LPN  PLOF: Independent  PATIENT GOALS:  reduce knee pain; get stronger; would like to be more active, be able to walk dog   OBJECTIVE:  Note: Objective measures were completed at Evaluation unless otherwise noted.  DIAGNOSTIC FINDINGS:  Per ortho MD note 11/28/23: 3 view x-rays AP, lateral, sunrise of the left knee ordered and taken today in clinic and images reviewed by myself show status post total knee arthroplasty with components in appropriate position. Patella tracking midline. No evidence of periprosthetic loosening or fracture.    LOWER EXTREMITY MMT:  MMT Right eval Left eval  Hip flexion 4- 3+*  Hip extension    Hip abduction 4+ 4+  Hip adduction 4+ 4+  Hip internal rotation    Hip external rotation  Knee flexion 4+ 4-  Knee extension 4+ 4-  Ankle dorsiflexion 4- 4-  Ankle plantarflexion 5 5   (Blank rows = not tested) *=pain reported by pt  10RM Strength Assessment BLE: 01/17/24: 10RM testing in [facility] -Knee extension machine:   *Rt knee (nonsurgical) plate 2, 12 reps   *Lt knee, plate 1 (12 reps, slight lower RPE) -Knee flexion machine:   *Rt knee, plate 3, 13 reps   *Lt knee, plate 3, 16 reps  -single leg heel raise: 4x bilat -wall leaning ankle DFx12 bilat, equal velocity and amplitude bilat                                                                                                                               TREATMENT DATE: 01/17/2024 10RM testing in [facility] -Knee extension machine:   *Rt knee (nonsurgical) plate 2, 12 reps   *Lt knee, plate 1 (12 reps, slight lower RPE) -Knee flexion  machine:   *Rt knee, plate 3, 13 reps   *Lt knee, plate 3, 16 reps  -single leg heel raise: 4x bilat -wall leaning ankle DFx12 bilat, equal velocity and amplitude bilat  -hooklying on plinth: sensory TENS application to Left VMO: SAQ 3x12x3secH -prone on elbow to assess any direcitonal preference to acute Left thigh burning     PATIENT EDUCATION:  Education details: POC as related to future sessions, implementation of HEP at next session Person educated: Patient Education method: Explanation Education comprehension: verbalized understanding and needs further education  HOME EXERCISE PROGRAM: Access Code: [email or web address] URL: [email or web address] Date: 12/20/2023 Prepared by: Sidra Simpers  Exercises - Supine Bridge  - 1 x daily - 7 x weekly - 3 sets - 10 reps - Supine Active Straight Leg Raise  - 1 x daily - 7 x weekly - 3 sets - 10 reps - Supine Hip Adduction Isometric with Ball  - 1 x daily - 7 x weekly - 3 sets - 10 reps - Seated Quad Set  - 1 x daily - 7 x weekly - 3 sets - 10 reps - Hooklying Abduction with Resistance  - 1 x daily - 7 x weekly - 3 sets - 10 reps  ASSESSMENT:  CLINICAL IMPRESSION:   Today marks 10 visits, pt showing great progress overall however she reports most remaining difficulty with subjective weakness about the LLE. Updated strength assessment performed today which reveals >50% weakness on the left knee extensors, all other tested areas very near equal in strength.  10RM assessment today revealing about 40% strength impairment remaining in Left knee extensors, none seen in hamstrings. 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 Pt does well with knee loading this date at higher intensities, no pain response noted. Pt would like to start using TENS at home. Author will set up for TB exercises at  home for these. Pt  will continue to benefit from skilled therapy to address remaining deficits in order to improve overall QoL and return to PLOF.    OBJECTIVE IMPAIRMENTS: Abnormal gait, decreased activity tolerance, decreased balance, decreased knowledge of condition, decreased mobility, difficulty walking, decreased ROM, decreased strength, increased edema, increased fascial restrictions, and pain.   ACTIVITY LIMITATIONS: lifting, bending, sitting, standing, squatting, sleeping, stairs, transfers, bed mobility, dressing, and locomotion level  PARTICIPATION LIMITATIONS: cleaning, laundry, and community activity  PERSONAL FACTORS: Age, Fitness, Sex, Time since onset of injury/illness/exacerbation, and 3+ comorbidities: DM, HTN, CKD, hyperlipidemia, obesity, OA, OSA on CPAP, hx of bladder cancer, insomnia are also affecting patient's functional outcome.  REHAB POTENTIAL: Good CLINICAL DECISION MAKING: Stable/uncomplicated EVALUATION COMPLEXITY: Low   GOALS: Goals reviewed with patient? Yes  SHORT TERM GOALS: Target date: 01/25/24 Patient will be independent with home exercise program to improve strength/mobility for increased functional independence with ADLs and mobility. Baseline: to be established at future visit  Goal status: INITIAL  LONG TERM GOALS: Target date: 03/07/24 Patient will complete five times sit to stand test (5XSTS) in 12.8 seconds indicating an increased LE strength and improved balance & match age-based norms. Baseline: 18.19 sec; -5xSTS: 10.37sec hands free; 5xSTS: 7.99sec;  Goal status: ACHIEVED  2.  Patient will increase 10 meter walk test to >1.42m/s without antalgic gait or knee buckling as to improve gait speed for better community ambulation and to reduce fall risk. Baseline: normal: 1.11 m/s with antalgic gait; fast: 1.21 m/s with antalgic gait, knee buckling Goal status: INITIAL  3.  Patient will increase lower extremity functional scale to >60/80 to demonstrate improved  functional mobility and increased tolerance with ADLs.  Baseline: 40/80; 01/14/24: 84% Goal status: ACHIEVED  4.  Patient will increase six minute walk test distance to >1035ft for progression to community level ambulation, demonstrating improved gait endurance. Baseline: 01/14/24: 1475% Goal status: INITIAL  5.  Patient will be able to complete stair navigation independently without UE support & significant increase in pain.  Baseline:  Goal status: INITIAL  6.  Pt will decrease worst knee pain as reported on NPRS by at least 4 points in order to demonstrate clinically significant reduction in knee pain.  Baseline: 10/10 Goal status: INITIAL  PLAN:  PT FREQUENCY: 1-2x/week PT DURATION: 12 weeks PLANNED INTERVENTIONS: 02835- PT Re-evaluation, 97750- Physical Performance Testing, 97110-Therapeutic exercises, 97530- Therapeutic activity, 97112- Neuromuscular re-education, 97535- Self Care, 02859- Manual therapy, [phone]- Gait training, [phone]- Canalith repositioning, [phone]- Electrical stimulation (manual), [phone] (1-2 muscles), [phone] (3+ muscles)- Dry Needling, Patient/Family education, Balance training, Stair training, Joint mobilization, Scar mobilization, Cryotherapy, and Moist heat  PLAN FOR NEXT SESSION:   -TENS with Left knee extensor loading progression -add in formal heel raises for home -add in formal knee extension with TB at home  9:26 AM, 01/17/24 Peggye JAYSON Linear, PT, DPT Physical Therapist - [facility]  Outpatient Physical Therapy- Main Campus [phone]

## 2024-01-21 ENCOUNTER — Ambulatory Visit

## 2024-01-21 DIAGNOSIS — M25562 Pain in left knee: Secondary | ICD-10-CM | POA: Diagnosis not present

## 2024-01-21 DIAGNOSIS — R2681 Unsteadiness on feet: Secondary | ICD-10-CM

## 2024-01-21 DIAGNOSIS — M6281 Muscle weakness (generalized): Secondary | ICD-10-CM

## 2024-01-21 DIAGNOSIS — R269 Unspecified abnormalities of gait and mobility: Secondary | ICD-10-CM

## 2024-01-21 DIAGNOSIS — G8929 Other chronic pain: Secondary | ICD-10-CM

## 2024-01-21 NOTE — Therapy (Signed)
 OUTPATIENT PHYSICAL THERAPY TREATMENT   Patient Name: Kelsey Mendoza MRN: [id number] DOB:[birth year], 73 y.o., female Today's Date: 01/21/2024  END OF SESSION:   PT End of Session - 01/21/24 1235     Visit Number 11    Number of Visits 24    Date for Recertification  03/06/24    Authorization Type HTA    Progress Note Due on Visit 20    PT Start Time 1147    PT Stop Time 1230    PT Time Calculation (min) 43 min    Equipment Utilized During Treatment --    Activity Tolerance Patient tolerated treatment well;No increased pain    Behavior During Therapy WFL for tasks assessed/performed             Past Medical History:  Diagnosis Date   Bladder cancer (HCC) 2000   resection x 3 and BCG therapy   CAD (coronary artery disease)    Chronic kidney disease, stage 3 (HCC)    Diastolic dysfunction    Diverticulosis    Fatigue    History of bilateral cataract extraction    HLD (hyperlipidemia)    HTN (hypertension)    Insomnia    Long-term current use of immunosuppressive biologic agent    a.) deucravacitinib   Obstructive sleep apnea with dependence on continuous positive airway pressure (CPAP)    Pericardial effusion    Primary osteoarthritis of left knee    Psoriasis of scalp    a.) Tx'd with deucravacitinib   Type 2 diabetes mellitus (HCC)    Past Surgical History:  Procedure Laterality Date   BLADDER SURGERY     x 3 resection bladder cancer   BROW LIFT Bilateral 05/27/2021   Procedure: BLEPHAROPLASTY UPPER EYELID; W/EXCESS SKIN AND BROW PTOSIS REPAIR BILATERAL;  Surgeon: Ashley Greig HERO, MD;  Location: [facility];  Service: Ophthalmology;  Laterality: Bilateral;   CARPAL TUNNEL RELEASE Right 06/06/2017   Procedure: CARPAL TUNNEL RELEASE;  Surgeon: Marchia Drivers, MD;  Location: [facility];  Service: Orthopedics;  Laterality: Right;   CATARACT EXTRACTION W/ INTRAOCULAR LENS  IMPLANT, BILATERAL Bilateral 08/2019   COLONOSCOPY  07/16/2012   EYE SURGERY  Left 11/26/2019   Dr. Elner, Vit, Mem Benedetto   I & D EXTREMITY Right 06/06/2017   Procedure: IRRIGATION AND DEBRIDEMENT EXTREMITY--RIGHT ARM;  Surgeon: Marchia Drivers, MD;  Location: [facility];  Service: Orthopedics;  Laterality: Right;   KNEE ARTHROSCOPY Left 10/06/2019   Procedure: ARTHROSCOPY KNEE,PARTIAL MEDIAL MENISECTOMY,PARTIAL CHONDROPLASTY;  Surgeon: Mardee Lynwood SQUIBB, MD;  Location: [facility];  Service: Orthopedics;  Laterality: Left;   LUMBAR SPINE SURGERY     MEDIAL PARTIAL KNEE REPLACEMENT Right 12/29/2016   Makoplasty   OPEN REDUCTION INTERNAL FIXATION (ORIF) DISTAL RADIAL FRACTURE N/A 06/06/2017   Procedure: OPEN REDUCTION INTERNAL FIXATION (ORIF) DISTAL RADIAL FRACTURE;  Surgeon: Marchia Drivers, MD;  Location: [facility];  Service: Orthopedics;  Laterality: N/A;   TOTAL KNEE ARTHROPLASTY Left 07/16/2023   Procedure: ARTHROPLASTY, KNEE, TOTAL;  Surgeon: Lorelle Hussar, MD;  Location: [facility];  Service: Orthopedics;  Laterality: Left;   VAGINAL HYSTERECTOMY     VITRECTOMY Bilateral    Patient Active Problem List   Diagnosis Date Noted   Neuropathy 01/16/2024   S/P TKR (total knee replacement), left 07/16/2023   Primary osteoarthritis of left knee 06/11/2023   Elevated TSH 04/18/2022   Benign hypertension with CKD (chronic kidney disease) stage III (HCC) 01/17/2022   Hyperlipidemia associated with type 2 diabetes mellitus (HCC) 01/17/2022  OSA on CPAP 06/13/2021   Posterior capsular opacification, right 03/25/2020   Postoperative follow-up 03/18/2020   Left posterior capsular opacification 02/12/2020   Left epiretinal membrane 11/10/2019   Right epiretinal membrane 11/10/2019   Cystoid macular edema of left eye 11/10/2019   Glaucoma suspect, bilateral 11/10/2019   Cancer (HCC) 10/04/2019   Closed Colles' fracture 07/03/2017   Distal radius fracture, right 06/05/2017   Contusion of left knee 11/27/2016   Contusion of right knee 11/27/2016   Depression 03/26/2014    Depressed affect 09/25/2013   Difficulty sleeping 09/25/2013   Type 2 diabetes mellitus with other specified complication (HCC) 09/03/2013   Diverticular disease 07/17/2012   Diverticulosis of colon 07/17/2012   Arthritis 07/14/2012   Bladder cancer (HCC) 07/14/2012    PCP: Marsa Officer, DO REFERRING PROVIDER: Arthea Sheer, MD REFERRING DIAG: M70.52 (ICD-10-CM) - Other bursitis of knee, left knee  THERAPY DIAG:   Chronic pain of left knee  Muscle weakness (generalized)  Unsteadiness on feet  Abnormality of gait  Rationale for Evaluation and Treatment: Rehabilitation ONSET DATE: L TKA 07/16/23; knee pain noted at physician visit 10/26/23.   SUBJECTIVE:   SUBJECTIVE STATEMENT:   Pt reports that her leg is feeling a little better regarding burning sensation; dermatologist Thursday afternoon said she had internal shingles, was given Acyclovir and has noted a major improvement in symptoms. Pt states that her symptoms are gone for the most part.  Pt dneies falls/stumbles, any other updates.     PERTINENT HISTORY: The patient had been doing fantastic after her left total knee replacement. She has developed some pes anserine bursitis and hamstring tendinitis after increasing her activities. There is no instability or increased motion in flexion on her exam. We discussed treatment options for the tendinitis including topical medications, local steroid injections, bracing, physical therapy and home exercises. Patient would like to try some anti-inflammatories and a local injection and do some physical therapy for the knee. Will also give her a brace to help calm down the inflammation. We will see how she does with these interventions and have her follow-up in a few months for repeat evaluation. All questions answered she agrees with above plan. PMH: DM, HTN, CKD, hyperlipidemia, obesity, OA, OSA on CPAP, hx of bladder cancer, insomnia  PAIN:  Are you having pain? 0/10 Left  knee; 8/10 Left groin burning  PRECAUTIONS: None  WEIGHT BEARING RESTRICTIONS: No  FALLS:  Has patient fallen in last 6 months? No  LIVING ENVIRONMENT: Lives with: lives alone Lives in: House/apartment Stairs: No Has following equipment at home: Vannie - 2 wheeled, Tour manager, Grab bars, and raised commode  OCCUPATION: retired Lexicographer, former LPN  PLOF: Independent  PATIENT GOALS:  reduce knee pain; get stronger; would like to be more active, be able to walk dog   OBJECTIVE:  Note: Objective measures were completed at Evaluation unless otherwise noted.  DIAGNOSTIC FINDINGS:  Per ortho MD note 11/28/23: 3 view x-rays AP, lateral, sunrise of the left knee ordered and taken today in clinic and images reviewed by myself show status post total knee arthroplasty with components in appropriate position. Patella tracking midline. No evidence of periprosthetic loosening or fracture.    LOWER EXTREMITY MMT:  MMT Right eval Left eval  Hip flexion 4- 3+*  Hip extension    Hip abduction 4+ 4+  Hip adduction 4+ 4+  Hip internal rotation    Hip external rotation    Knee flexion 4+ 4-  Knee extension 4+  4-  Ankle dorsiflexion 4- 4-  Ankle plantarflexion 5 5   (Blank rows = not tested) *=pain reported by pt  10RM Strength Assessment BLE: 01/17/24: 10RM testing in wellzone -Knee extension machine:   *Rt knee (nonsurgical) plate 2, 12 reps   *Lt knee, plate 1 (12 reps, slight lower RPE) -Knee flexion machine:   *Rt knee, plate 3, 13 reps   *Lt knee, plate 3, 16 reps  -single leg heel raise: 4x bilat -wall leaning ankle DFx12 bilat, equal velocity and amplitude bilat                                                                                                                               TREATMENT DATE: 01/21/2024 Gait focused on increased gait speed; pt completing 600' with some deviation in pathway noted with increased speed.   Pt bringing personal TENS  unit; donned, educated on donning in interferential pattern.   Following therapeutic exercise completed in wellzone with TENS unit utilized:   3x12 leg extension L LE only, completing 12x calf raises between sets; plate 1 6k87 hamstring curl L LE only; plate 3 Trialed plate 4 for 3-4 reps, pt reporting inability to complete total 12 reps at this weight.   For implementation during HEP:  Seated knee extension with theraband tied at chair for resistance, L LE only, TENS unit donned 10x with red theraband, pt reporting low RPE Progressed to blue theraband with report of adequate difficulty Pt provided theraband & handout of HEP exercise.  Pt also educated on duration for optimal benefit regarding TENS unit.      PATIENT EDUCATION:  Education details: POC as related to future sessions, implementation of HEP at next session Person educated: Patient Education method: Explanation Education comprehension: verbalized understanding and needs further education  HOME EXERCISE PROGRAM: Access Code: [email or web address] URL: [email or web address] Date: 12/20/2023 Prepared by: Sidra Simpers  Exercises - Supine Bridge  - 1 x daily - 7 x weekly - 3 sets - 10 reps - Supine Active Straight Leg Raise  - 1 x daily - 7 x weekly - 3 sets - 10 reps - Supine Hip Adduction Isometric with Ball  - 1 x daily - 7 x weekly - 3 sets - 10 reps - Seated Quad Set  - 1 x daily - 7 x weekly - 3 sets - 10 reps - Hooklying Abduction with Resistance  - 1 x daily - 7 x weekly - 3 sets - 10 reps  Access Code: [email or web address] URL: [email or web address] Date: 01/21/2024 Prepared by: Peggye Linear  Exercises - Sitting Knee Extension with Resistance  - 3 x daily - 1 sets - 20 reps - 3sec hold  ASSESSMENT:  CLINICAL IMPRESSION:   Completed resistance training in wellzone with TENS unit donned for left knee extensor loading progression without exacerbation of pain; completed calf raises between sets to address  strength deficits in this muscle group. Attempted to increase resistance with hamstring curls,  pt reporting significant increase in difficulty. Began to implement theraband exercise as part of HEP for pt to complete with utilization of HE to include resisted knee extension in seated with blue theraband. Pt inquiring about membership at [facility], plan to address further at next visit. Pt will continue to benefit from skilled therapy to address remaining deficits in order to improve overall QoL and return to PLOF.    OBJECTIVE IMPAIRMENTS: Abnormal gait, decreased activity tolerance, decreased balance, decreased knowledge of condition, decreased mobility, difficulty walking, decreased ROM, decreased strength, increased edema, increased fascial restrictions, and pain.   ACTIVITY LIMITATIONS: lifting, bending, sitting, standing, squatting, sleeping, stairs, transfers, bed mobility, dressing, and locomotion level  PARTICIPATION LIMITATIONS: cleaning, laundry, and community activity  PERSONAL FACTORS: Age, Fitness, Sex, Time since onset of injury/illness/exacerbation, and 3+ comorbidities: DM, HTN, CKD, hyperlipidemia, obesity, OA, OSA on CPAP, hx of bladder cancer, insomnia are also affecting patient's functional outcome.  REHAB POTENTIAL: Good CLINICAL DECISION MAKING: Stable/uncomplicated EVALUATION COMPLEXITY: Low   GOALS: Goals reviewed with patient? Yes  SHORT TERM GOALS: Target date: 01/25/24 Patient will be independent with home exercise program to improve strength/mobility for increased functional independence with ADLs and mobility. Baseline: to be established at future visit  Goal status: INITIAL  LONG TERM GOALS: Target date: 03/07/24 Patient will complete five times sit to stand test (5XSTS) in 12.8 seconds indicating an increased LE strength and improved balance & match age-based norms. Baseline: 18.19 sec; -5xSTS: 10.37sec hands free; 5xSTS: 7.99sec;  Goal status: ACHIEVED  2.   Patient will increase 10 meter walk test to >1.66m/s without antalgic gait or knee buckling as to improve gait speed for better community ambulation and to reduce fall risk. Baseline: normal: 1.11 m/s with antalgic gait; fast: 1.21 m/s with antalgic gait, knee buckling Goal status: INITIAL  3.  Patient will increase lower extremity functional scale to >60/80 to demonstrate improved functional mobility and increased tolerance with ADLs.  Baseline: 40/80; 01/14/24: 84% Goal status: ACHIEVED  4.  Patient will increase six minute walk test distance to >1097ft for progression to community level ambulation, demonstrating improved gait endurance. Baseline: 01/14/24: 1475% Goal status: INITIAL  5.  Patient will be able to complete stair navigation independently without UE support & significant increase in pain.  Baseline:  Goal status: INITIAL  6.  Pt will decrease worst knee pain as reported on NPRS by at least 4 points in order to demonstrate clinically significant reduction in knee pain.  Baseline: 10/10 Goal status: INITIAL  PLAN:  PT FREQUENCY: 1-2x/week PT DURATION: 12 weeks PLANNED INTERVENTIONS: 97164- PT Re-evaluation, 97750- Physical Performance Testing, 97110-Therapeutic exercises, 97530- Therapeutic activity, 97112- Neuromuscular re-education, 97535- Self Care, 02859- Manual therapy, [phone]- Gait training, [phone]- Canalith repositioning, [phone]- Electrical stimulation (manual), [phone] (1-2 muscles), 20561 (3+ muscles)- Dry Needling, Patient/Family education, Balance training, Stair training, Joint mobilization, Scar mobilization, Cryotherapy, and Moist heat  PLAN FOR NEXT SESSION:  -discuss option for [employer] -TENS with Left knee extensor loading progression -add in formal heel raises for home  1:33 PM, 01/21/24 Chiquita Silvan, SPT Physical Therapy Student - [place]  [facility]

## 2024-01-22 ENCOUNTER — Telehealth: Payer: Self-pay

## 2024-01-22 DIAGNOSIS — R519 Headache, unspecified: Secondary | ICD-10-CM

## 2024-01-22 DIAGNOSIS — G43909 Migraine, unspecified, not intractable, without status migrainosus: Secondary | ICD-10-CM

## 2024-01-22 MED ORDER — UBRELVY 100 MG PO TABS: 100.0000 mg | ORAL_TABLET | Freq: Every day | ORAL | 2 refills | Status: AC | PRN

## 2024-01-22 NOTE — Addendum Note (Signed)
 Addended by: EDMAN MARSA PARAS on: 01/22/2024 05:28 PM   Modules accepted: Orders

## 2024-01-22 NOTE — Telephone Encounter (Signed)
 Copied from CRM [id number]. Topic: Referral - Question >> Jan 22, 2024  3:41 PM Nathanel BROCKS wrote: Reason for CRM: pt called and is still having issues with major migraines. She is asking if you would consider an mri to see if there is anything else going on. Please call and advise pt regarding the mri requested.

## 2024-01-22 NOTE — Telephone Encounter (Signed)
 Called patient. She still has episodic migraine headaches most days, Rizatriptan  does work and stops headache for a day, but it can come back soon. I advised her that MRI is not my next move at this point, usually I would change her med and refer to Neurologist, they can discuss diagnostic imaging and MRI if needed.  She has 2 pills remaining maxalt . I will upgrade her to [place] 100mg  new rx sent to pharmacy it is listed as preferred option, and we can try to get covered for her. If not covered or high cost, we can re order Maxalt  Rizatriptan .  Referral to [facility] for headaches submitted. She is aware and knows to seek care sooner if severe headache.  Marsa Officer, DO [facility] 01/22/2024, 5:24 PM

## 2024-01-23 ENCOUNTER — Other Ambulatory Visit (HOSPITAL_COMMUNITY): Payer: Self-pay

## 2024-01-23 ENCOUNTER — Telehealth: Payer: Self-pay

## 2024-01-23 NOTE — Telephone Encounter (Signed)
 Pharmacy Patient Advocate Encounter   Received notification from Onbase that prior authorization for Ubrelvy 100MG  tablets is required/requested.   Insurance verification completed.   The patient is insured through [facility] ADVANTAGE/RX ADVANCE.   Per test claim: PA required; PA submitted to above mentioned insurance via Latent Key/confirmation #/EOC [facility] Status is pending

## 2024-01-24 ENCOUNTER — Telehealth: Payer: Self-pay

## 2024-01-24 ENCOUNTER — Other Ambulatory Visit (HOSPITAL_COMMUNITY): Payer: Self-pay

## 2024-01-24 ENCOUNTER — Ambulatory Visit: Admitting: Physical Therapy

## 2024-01-24 ENCOUNTER — Other Ambulatory Visit: Payer: Self-pay | Admitting: Family Medicine

## 2024-01-24 DIAGNOSIS — G43909 Migraine, unspecified, not intractable, without status migrainosus: Secondary | ICD-10-CM

## 2024-01-24 DIAGNOSIS — R269 Unspecified abnormalities of gait and mobility: Secondary | ICD-10-CM

## 2024-01-24 DIAGNOSIS — G8929 Other chronic pain: Secondary | ICD-10-CM

## 2024-01-24 DIAGNOSIS — G44209 Tension-type headache, unspecified, not intractable: Secondary | ICD-10-CM

## 2024-01-24 DIAGNOSIS — M6281 Muscle weakness (generalized): Secondary | ICD-10-CM

## 2024-01-24 DIAGNOSIS — R2681 Unsteadiness on feet: Secondary | ICD-10-CM

## 2024-01-24 NOTE — Telephone Encounter (Signed)
 Please note that our office has received a request for an update on Ublrevy as the pt was currently at the pharmacy.   Pharmacy Patient Advocate Encounter  Insurance verification completed.   The patient is insured through [facility] ADVANTAGE/RX ADVANCE   Ran test claim for Ubrelvy 100mg . Currently a quantity of 16tabs is a 30 day supply and the co-pay is 0.00 .  Authorization effective until 10.15.26. The pts pref'd pharmacy has been contacted as of today to rerun the script due to the approval we received today  This test claim was processed through [facility]- copay amounts may vary at other pharmacies due to pharmacy/plan contracts, or as the patient moves through the different stages of their insurance plan.

## 2024-01-24 NOTE — Telephone Encounter (Signed)
 Copied from CRM [id number]. Topic: Clinical - Prescription Issue >> Jan 24, 2024  9:31 AM Gustabo D wrote: UBRELVY 100 MG TABS- needs prior authorization Pt is currently at the pharmacy trying to get her prescription and the pharmacy sent over a message as well Rizatriptan  Benzoate 10 mg

## 2024-01-24 NOTE — Telephone Encounter (Signed)
 Patient advised and verbalized understanding

## 2024-01-24 NOTE — Therapy (Signed)
 OUTPATIENT PHYSICAL THERAPY TREATMENT   Patient Name: Kelsey Mendoza MRN: [id number] DOB:[birth year], 73 y.o., female Today's Date: 01/24/2024  END OF SESSION:   PT End of Session - 01/24/24 0855     Visit Number 11   unbilled visit   Number of Visits 24    Date for Recertification  03/06/24    Authorization Type HTA    Progress Note Due on Visit 20    PT Start Time 0850    PT Stop Time 0855    PT Time Calculation (min) 5 min    Behavior During Therapy [facility] for tasks assessed/performed              Past Medical History:  Diagnosis Date   Bladder cancer (HCC) 2000   resection x 3 and BCG therapy   CAD (coronary artery disease)    Chronic kidney disease, stage 3 (HCC)    Diastolic dysfunction    Diverticulosis    Fatigue    History of bilateral cataract extraction    HLD (hyperlipidemia)    HTN (hypertension)    Insomnia    Long-term current use of immunosuppressive biologic agent    a.) deucravacitinib   Obstructive sleep apnea with dependence on continuous positive airway pressure (CPAP)    Pericardial effusion    Primary osteoarthritis of left knee    Psoriasis of scalp    a.) Tx'd with deucravacitinib   Type 2 diabetes mellitus (HCC)    Past Surgical History:  Procedure Laterality Date   BLADDER SURGERY     x 3 resection bladder cancer   BROW LIFT Bilateral 05/27/2021   Procedure: BLEPHAROPLASTY UPPER EYELID; W/EXCESS SKIN AND BROW PTOSIS REPAIR BILATERAL;  Surgeon: Ashley Greig HERO, MD;  Location: [facility];  Service: Ophthalmology;  Laterality: Bilateral;   CARPAL TUNNEL RELEASE Right 06/06/2017   Procedure: CARPAL TUNNEL RELEASE;  Surgeon: Marchia Drivers, MD;  Location: [facility];  Service: Orthopedics;  Laterality: Right;   CATARACT EXTRACTION W/ INTRAOCULAR LENS  IMPLANT, BILATERAL Bilateral 08/2019   COLONOSCOPY  07/16/2012   EYE SURGERY Left 11/26/2019   Dr. Elner, Vit, Mem Benedetto   I & D EXTREMITY Right 06/06/2017   Procedure:  IRRIGATION AND DEBRIDEMENT EXTREMITY--RIGHT ARM;  Surgeon: Marchia Drivers, MD;  Location: [facility];  Service: Orthopedics;  Laterality: Right;   KNEE ARTHROSCOPY Left 10/06/2019   Procedure: ARTHROSCOPY KNEE,PARTIAL MEDIAL MENISECTOMY,PARTIAL CHONDROPLASTY;  Surgeon: Mardee Lynwood SQUIBB, MD;  Location: [facility];  Service: Orthopedics;  Laterality: Left;   LUMBAR SPINE SURGERY     MEDIAL PARTIAL KNEE REPLACEMENT Right 12/29/2016   Makoplasty   OPEN REDUCTION INTERNAL FIXATION (ORIF) DISTAL RADIAL FRACTURE N/A 06/06/2017   Procedure: OPEN REDUCTION INTERNAL FIXATION (ORIF) DISTAL RADIAL FRACTURE;  Surgeon: Marchia Drivers, MD;  Location: [facility];  Service: Orthopedics;  Laterality: N/A;   TOTAL KNEE ARTHROPLASTY Left 07/16/2023   Procedure: ARTHROPLASTY, KNEE, TOTAL;  Surgeon: Lorelle Hussar, MD;  Location: [facility];  Service: Orthopedics;  Laterality: Left;   VAGINAL HYSTERECTOMY     VITRECTOMY Bilateral    Patient Active Problem List   Diagnosis Date Noted   Neuropathy 01/16/2024   S/P TKR (total knee replacement), left 07/16/2023   Primary osteoarthritis of left knee 06/11/2023   Elevated TSH 04/18/2022   Benign hypertension with CKD (chronic kidney disease) stage III (HCC) 01/17/2022   Hyperlipidemia associated with type 2 diabetes mellitus (HCC) 01/17/2022   OSA on CPAP 06/13/2021   Posterior capsular opacification, right 03/25/2020  Postoperative follow-up 03/18/2020   Left posterior capsular opacification 02/12/2020   Left epiretinal membrane 11/10/2019   Right epiretinal membrane 11/10/2019   Cystoid macular edema of left eye 11/10/2019   Glaucoma suspect, bilateral 11/10/2019   Cancer (HCC) 10/04/2019   Closed Colles' fracture 07/03/2017   Distal radius fracture, right 06/05/2017   Contusion of left knee 11/27/2016   Contusion of right knee 11/27/2016   Depression 03/26/2014   Depressed affect 09/25/2013   Difficulty sleeping 09/25/2013   Type 2 diabetes mellitus with  other specified complication (HCC) 09/03/2013   Diverticular disease 07/17/2012   Diverticulosis of colon 07/17/2012   Arthritis 07/14/2012   Bladder cancer (HCC) 07/14/2012    PCP: Marsa Officer, DO REFERRING PROVIDER: Arthea Sheer, MD REFERRING DIAG: M70.52 (ICD-10-CM) - Other bursitis of knee, left knee  THERAPY DIAG:   Chronic pain of left knee  Abnormality of gait  Muscle weakness (generalized)  Unsteadiness on feet  Rationale for Evaluation and Treatment: Rehabilitation ONSET DATE: L TKA 07/16/23; knee pain noted at physician visit 10/26/23.   SUBJECTIVE:   SUBJECTIVE STATEMENT:   Pt reporting that she is not feeling well this morning, currently with 9/10 HA; MD calling in ubrevly, awaiting prior auth. Pt reporting that her blood pressure was spiked yesterday, reporting that BP was normal this morning.   Pt reported that she was able to schedule visit with Neurology, first available appointment on 04/28/24. Went to office & asked to be put on cancellation list. Pt stated that she then stopped by cardiologist office, and was able to be seen by nurse where they checked her vitals, was able to discuss concerns with nurse.     PERTINENT HISTORY: The patient had been doing fantastic after her left total knee replacement. She has developed some pes anserine bursitis and hamstring tendinitis after increasing her activities. There is no instability or increased motion in flexion on her exam. We discussed treatment options for the tendinitis including topical medications, local steroid injections, bracing, physical therapy and home exercises. Patient would like to try some anti-inflammatories and a local injection and do some physical therapy for the knee. Will also give her a brace to help calm down the inflammation. We will see how she does with these interventions and have her follow-up in a few months for repeat evaluation. All questions answered she agrees with above plan.  PMH: DM, HTN, CKD, hyperlipidemia, obesity, OA, OSA on CPAP, hx of bladder cancer, insomnia  PAIN:  Are you having pain? 0/10 Left knee; 8/10 Left groin burning  PRECAUTIONS: None  WEIGHT BEARING RESTRICTIONS: No  FALLS:  Has patient fallen in last 6 months? No  LIVING ENVIRONMENT: Lives with: lives alone Lives in: House/apartment Stairs: No Has following equipment at home: Vannie - 2 wheeled, Tour manager, Grab bars, and raised commode  OCCUPATION: retired Lexicographer, former LPN  PLOF: Independent  PATIENT GOALS:  reduce knee pain; get stronger; would like to be more active, be able to walk dog   OBJECTIVE:  Note: Objective measures were completed at Evaluation unless otherwise noted.  DIAGNOSTIC FINDINGS:  Per ortho MD note 11/28/23: 3 view x-rays AP, lateral, sunrise of the left knee ordered and taken today in clinic and images reviewed by myself show status post total knee arthroplasty with components in appropriate position. Patella tracking midline. No evidence of periprosthetic loosening or fracture.    LOWER EXTREMITY MMT:  MMT Right eval Left eval  Hip flexion 4- 3+*  Hip extension  Hip abduction 4+ 4+  Hip adduction 4+ 4+  Hip internal rotation    Hip external rotation    Knee flexion 4+ 4-  Knee extension 4+ 4-  Ankle dorsiflexion 4- 4-  Ankle plantarflexion 5 5   (Blank rows = not tested) *=pain reported by pt  10RM Strength Assessment BLE: 01/17/24: 10RM testing in wellzone -Knee extension machine:   *Rt knee (nonsurgical) plate 2, 12 reps   *Lt knee, plate 1 (12 reps, slight lower RPE) -Knee flexion machine:   *Rt knee, plate 3, 13 reps   *Lt knee, plate 3, 16 reps  -single leg heel raise: 4x bilat -wall leaning ankle DFx12 bilat, equal velocity and amplitude bilat                                                                                                                               TREATMENT DATE: 01/24/2024 Deferred  participation in PT treatment d/t patient report of HA 9/10 pain.    PATIENT EDUCATION:  Education details: POC as related to future sessions, implementation of HEP at next session Person educated: Patient Education method: Explanation Education comprehension: verbalized understanding and needs further education  HOME EXERCISE PROGRAM: Access Code: [email or web address] URL: [email or web address] Date: 12/20/2023 Prepared by: Sidra Simpers  Exercises - Supine Bridge  - 1 x daily - 7 x weekly - 3 sets - 10 reps - Supine Active Straight Leg Raise  - 1 x daily - 7 x weekly - 3 sets - 10 reps - Supine Hip Adduction Isometric with Ball  - 1 x daily - 7 x weekly - 3 sets - 10 reps - Seated Quad Set  - 1 x daily - 7 x weekly - 3 sets - 10 reps - Hooklying Abduction with Resistance  - 1 x daily - 7 x weekly - 3 sets - 10 reps  Access Code: [email or web address] URL: [email or web address] Date: 01/21/2024 Prepared by: Peggye Linear  Exercises - Sitting Knee Extension with Resistance  - 3 x daily - 1 sets - 20 reps - 3sec hold  ASSESSMENT:  CLINICAL IMPRESSION:   Deferred participation in PT treatment d/t patient report of HA 9/10 pain.  Pt will continue to benefit from skilled therapy to address remaining deficits in order to improve overall QoL and return to PLOF.    OBJECTIVE IMPAIRMENTS: Abnormal gait, decreased activity tolerance, decreased balance, decreased knowledge of condition, decreased mobility, difficulty walking, decreased ROM, decreased strength, increased edema, increased fascial restrictions, and pain.   ACTIVITY LIMITATIONS: lifting, bending, sitting, standing, squatting, sleeping, stairs, transfers, bed mobility, dressing, and locomotion level  PARTICIPATION LIMITATIONS: cleaning, laundry, and community activity  PERSONAL FACTORS: Age, Fitness, Sex, Time since onset of injury/illness/exacerbation, and 3+ comorbidities: DM, HTN, CKD, hyperlipidemia, obesity, OA, OSA  on CPAP, hx of bladder cancer, insomnia are also affecting patient's functional outcome.  REHAB POTENTIAL: Good CLINICAL DECISION MAKING: Stable/uncomplicated EVALUATION COMPLEXITY: Low   GOALS: Goals reviewed with patient?  Yes  SHORT TERM GOALS: Target date: 01/25/24 Patient will be independent with home exercise program to improve strength/mobility for increased functional independence with ADLs and mobility. Baseline: to be established at future visit  Goal status: INITIAL  LONG TERM GOALS: Target date: 03/07/24 Patient will complete five times sit to stand test (5XSTS) in 12.8 seconds indicating an increased LE strength and improved balance & match age-based norms. Baseline: 18.19 sec; -5xSTS: 10.37sec hands free; 5xSTS: 7.99sec;  Goal status: ACHIEVED  2.  Patient will increase 10 meter walk test to >1.61m/s without antalgic gait or knee buckling as to improve gait speed for better community ambulation and to reduce fall risk. Baseline: normal: 1.11 m/s with antalgic gait; fast: 1.21 m/s with antalgic gait, knee buckling Goal status: INITIAL  3.  Patient will increase lower extremity functional scale to >60/80 to demonstrate improved functional mobility and increased tolerance with ADLs.  Baseline: 40/80; 01/14/24: 84% Goal status: ACHIEVED  4.  Patient will increase six minute walk test distance to >1072ft for progression to community level ambulation, demonstrating improved gait endurance. Baseline: 01/14/24: 1475% Goal status: INITIAL  5.  Patient will be able to complete stair navigation independently without UE support & significant increase in pain.  Baseline:  Goal status: INITIAL  6.  Pt will decrease worst knee pain as reported on NPRS by at least 4 points in order to demonstrate clinically significant reduction in knee pain.  Baseline: 10/10 Goal status: INITIAL  PLAN:  PT FREQUENCY: 1-2x/week PT DURATION: 12 weeks PLANNED INTERVENTIONS: 97164- PT Re-evaluation,  97750- Physical Performance Testing, 97110-Therapeutic exercises, 97530- Therapeutic activity, 97112- Neuromuscular re-education, 97535- Self Care, 02859- Manual therapy, [phone]- Gait training, [phone]- Canalith repositioning, [phone]- Electrical stimulation (manual), [phone] (1-2 muscles), [phone] (3+ muscles)- Dry Needling, Patient/Family education, Balance training, Stair training, Joint mobilization, Scar mobilization, Cryotherapy, and Moist heat  PLAN FOR NEXT SESSION:  -discuss option for [employer] -TENS with Left knee extensor loading progression -add in formal heel raises for home  8:57 AM, 01/24/24 Chiquita Silvan, SPT Physical Therapy Student - [employer]  [facility]

## 2024-01-24 NOTE — Telephone Encounter (Signed)
 Pharmacy Patient Advocate Encounter  Received notification from [facility] [employer] that Prior Authorization for Ubrelvy 100mg  tabs has been APPROVED from 01/23/24 to 01/22/25   PA #/Case ID/Reference #: [id number]  Insurance will allow a max of 16 tabs per 30   Left a message at [facility] to notify of the approval. Approval letter indexed to media tab.

## 2024-01-25 NOTE — Telephone Encounter (Signed)
 No longer listed on current medication list Requested Prescriptions  Pending Prescriptions Disp Refills   rizatriptan  (MAXALT -MLT) 10 MG disintegrating tablet [Pharmacy Med Name: RIZATRIPTAN  10 MG ODT] 10 tablet 0    Sig: TAKE 1 TABLET (10 MG TOTAL) BY MOUTH AS NEEDED MAY REPEAT IN 2 HOURS IF NEEDED     Neurology:  Migraine Therapy - Triptan Passed - 01/25/2024  2:41 PM      Passed - Last BP in normal range    BP Readings from Last 1 Encounters:  01/16/24 126/74         Passed - Valid encounter within last 12 months    Recent Outpatient Visits           1 week ago Neuropathy   [place] [facility] [place],  A, MD   4 weeks ago Acute non intractable tension-type headache   [place] [facility] [place], Marsa PARAS, DO   1 month ago Type 2 diabetes mellitus with other specified complication, without long-term current use of insulin [facility])   [place] [facility] [place], Marsa PARAS, DO   2 months ago Onychomycosis of left great toe   [place] [facility] [place] Marsa PARAS, DO   4 months ago Other fatigue   [place] [facility] [place], Angeline ORN, [place]

## 2024-01-28 ENCOUNTER — Ambulatory Visit

## 2024-01-28 DIAGNOSIS — M25562 Pain in left knee: Secondary | ICD-10-CM | POA: Diagnosis not present

## 2024-01-28 DIAGNOSIS — R2681 Unsteadiness on feet: Secondary | ICD-10-CM

## 2024-01-28 DIAGNOSIS — R269 Unspecified abnormalities of gait and mobility: Secondary | ICD-10-CM

## 2024-01-28 DIAGNOSIS — G8929 Other chronic pain: Secondary | ICD-10-CM

## 2024-01-28 DIAGNOSIS — M6281 Muscle weakness (generalized): Secondary | ICD-10-CM

## 2024-01-28 NOTE — Therapy (Signed)
 OUTPATIENT PHYSICAL THERAPY TREATMENT  Patient Name: Kelsey Mendoza MRN: [id number] DOB:11/22/1950, 73 y.o., female Today's Date: 01/28/2024  END OF SESSION:   PT End of Session - 01/28/24 1029     Visit Number 12    Number of Visits 24    Date for Recertification  03/06/24    Authorization Type HTA    Progress Note Due on Visit 20    PT Start Time 1015    PT Stop Time 1055    PT Time Calculation (min) 40 min    Activity Tolerance Patient tolerated treatment well;No increased pain    Behavior During Therapy WFL for tasks assessed/performed           Past Medical History:  Diagnosis Date   Bladder cancer (HCC) 2000   resection x 3 and BCG therapy   CAD (coronary artery disease)    Chronic kidney disease, stage 3 (HCC)    Diastolic dysfunction    Diverticulosis    Fatigue    History of bilateral cataract extraction    HLD (hyperlipidemia)    HTN (hypertension)    Insomnia    Long-term current use of immunosuppressive biologic agent    a.) deucravacitinib   Obstructive sleep apnea with dependence on continuous positive airway pressure (CPAP)    Pericardial effusion    Primary osteoarthritis of left knee    Psoriasis of scalp    a.) Tx'd with deucravacitinib   Type 2 diabetes mellitus (HCC)    Past Surgical History:  Procedure Laterality Date   BLADDER SURGERY     x 3 resection bladder cancer   BROW LIFT Bilateral 05/27/2021   Procedure: BLEPHAROPLASTY UPPER EYELID; W/EXCESS SKIN AND BROW PTOSIS REPAIR BILATERAL;  Surgeon: Ashley Greig HERO, MD;  Location: [facility];  Service: Ophthalmology;  Laterality: Bilateral;   CARPAL TUNNEL RELEASE Right 06/06/2017   Procedure: CARPAL TUNNEL RELEASE;  Surgeon: Marchia Drivers, MD;  Location: [facility];  Service: Orthopedics;  Laterality: Right;   CATARACT EXTRACTION W/ INTRAOCULAR LENS  IMPLANT, BILATERAL Bilateral 08/2019   COLONOSCOPY  07/16/2012   EYE SURGERY Left 11/26/2019   Dr. Elner, Vit, Mem Benedetto   I &  D EXTREMITY Right 06/06/2017   Procedure: IRRIGATION AND DEBRIDEMENT EXTREMITY--RIGHT ARM;  Surgeon: Marchia Drivers, MD;  Location: [facility];  Service: Orthopedics;  Laterality: Right;   KNEE ARTHROSCOPY Left 10/06/2019   Procedure: ARTHROSCOPY KNEE,PARTIAL MEDIAL MENISECTOMY,PARTIAL CHONDROPLASTY;  Surgeon: Mardee Lynwood SQUIBB, MD;  Location: [facility];  Service: Orthopedics;  Laterality: Left;   LUMBAR SPINE SURGERY     MEDIAL PARTIAL KNEE REPLACEMENT Right 12/29/2016   Makoplasty   OPEN REDUCTION INTERNAL FIXATION (ORIF) DISTAL RADIAL FRACTURE N/A 06/06/2017   Procedure: OPEN REDUCTION INTERNAL FIXATION (ORIF) DISTAL RADIAL FRACTURE;  Surgeon: Marchia Drivers, MD;  Location: [facility];  Service: Orthopedics;  Laterality: N/A;   TOTAL KNEE ARTHROPLASTY Left 07/16/2023   Procedure: ARTHROPLASTY, KNEE, TOTAL;  Surgeon: Lorelle Hussar, MD;  Location: [facility];  Service: Orthopedics;  Laterality: Left;   VAGINAL HYSTERECTOMY     VITRECTOMY Bilateral    Patient Active Problem List   Diagnosis Date Noted   Neuropathy 01/16/2024   S/P TKR (total knee replacement), left 07/16/2023   Primary osteoarthritis of left knee 06/11/2023   Elevated TSH 04/18/2022   Benign hypertension with CKD (chronic kidney disease) stage III (HCC) 01/17/2022   Hyperlipidemia associated with type 2 diabetes mellitus (HCC) 01/17/2022   OSA on CPAP 06/13/2021   Posterior capsular opacification,  right 03/25/2020   Postoperative follow-up 03/18/2020   Left posterior capsular opacification 02/12/2020   Left epiretinal membrane 11/10/2019   Right epiretinal membrane 11/10/2019   Cystoid macular edema of left eye 11/10/2019   Glaucoma suspect, bilateral 11/10/2019   Cancer (HCC) 10/04/2019   Closed Colles' fracture 07/03/2017   Distal radius fracture, right 06/05/2017   Contusion of left knee 11/27/2016   Contusion of right knee 11/27/2016   Depression 03/26/2014   Depressed affect 09/25/2013   Difficulty sleeping  09/25/2013   Type 2 diabetes mellitus with other specified complication (HCC) 09/03/2013   Diverticular disease 07/17/2012   Diverticulosis of colon 07/17/2012   Arthritis 07/14/2012   Bladder cancer (HCC) 07/14/2012    PCP: Marsa Officer, DO REFERRING PROVIDER: Arthea Sheer, MD REFERRING DIAG: M70.52 (ICD-10-CM) - Other bursitis of knee, left knee  THERAPY DIAG:   Chronic pain of left knee  Abnormality of gait  Muscle weakness (generalized)  Unsteadiness on feet  Rationale for Evaluation and Treatment: Rehabilitation ONSET DATE: L TKA 07/16/23; knee pain noted at physician visit 10/26/23.   SUBJECTIVE:   SUBJECTIVE STATEMENT:   Pt feeling better today but did have a HA over the weekend, new medication forthis. Pt did not work on HEP while in pain. Pt reports her LAQ with TBand setup worked just fine. Pt would like to join [facility] today if possible.  PERTINENT HISTORY: The patient had been doing fantastic after her left total knee replacement. She has developed some pes anserine bursitis and hamstring tendinitis after increasing her activities. There is no instability or increased motion in flexion on her exam. We discussed treatment options for the tendinitis including topical medications, local steroid injections, bracing, physical therapy and home exercises. Patient would like to try some anti-inflammatories and a local injection and do some physical therapy for the knee. Will also give her a brace to help calm down the inflammation. We will see how she does with these interventions and have her follow-up in a few months for repeat evaluation. All questions answered she agrees with above plan. PMH: DM, HTN, CKD, hyperlipidemia, obesity, OA, OSA on CPAP, hx of bladder cancer, insomnia  PAIN:  Are you having pain? No knee pain, no left groin pain.   PRECAUTIONS: None  WEIGHT BEARING RESTRICTIONS: No  FALLS:  Has patient fallen in last 6 months? No  LIVING  ENVIRONMENT: Lives with: lives alone Lives in: House/apartment Stairs: No Has following equipment at home: Vannie - 2 wheeled, Tour manager, Grab bars, and raised commode  OCCUPATION: retired Lexicographer, former LPN  PLOF: Independent  PATIENT GOALS:  reduce knee pain; get stronger; would like to be more active, be able to walk dog   OBJECTIVE:  Note: Objective measures were completed at Evaluation unless otherwise noted.  DIAGNOSTIC FINDINGS:  Per ortho MD note 11/28/23: 3 view x-rays AP, lateral, sunrise of the left knee ordered and taken today in clinic and images reviewed by myself show status post total knee arthroplasty with components in appropriate position. Patella tracking midline. No evidence of periprosthetic loosening or fracture.    LOWER EXTREMITY MMT:  MMT Right eval Left eval  Hip flexion 4- 3+*  Hip abduction 4+ 4+  Hip adduction 4+ 4+  Knee flexion 4+ 4-  Knee extension 4+ 4-  Ankle dorsiflexion 4- 4-  Ankle plantarflexion 5 5   (Blank rows = not tested) *=pain reported by pt  10RM Strength Assessment BLE: 01/17/24: 10RM testing in [facility] -Knee extension machine:   *  Rt knee (nonsurgical) plate 2, 12 reps   *Lt knee, plate 1 (12 reps, slight lower RPE) -Knee flexion machine:   *Rt knee, plate 3, 13 reps   *Lt knee, plate 3, 16 reps  -single leg heel raise: 4x bilat -wall leaning ankle DFx12 bilat, equal velocity and amplitude bilat                                                                                                                               TREATMENT DATE: 01/28/2024 TENS application to Left VMO -AMB to [facility] -[facility] leg press seat 4, plates 6: [id number]  -[facility] knee extension 1x12 @ 2 plates single legs knee extension bilat -[facility] knee extension 1x12 @ 2 plates single legs knee extension bilat Education on knee extension set up -seated ankle DF 1x15 @ 4lb bilat  -seated ankle DF 1x15 @ 6lb bilat   PATIENT  EDUCATION:  Education details: POC as related to future sessions, implementation of HEP at next session Person educated: Patient Education method: Explanation Education comprehension: verbalized understanding and needs further education  HOME EXERCISE PROGRAM: Access Code: [email or web address] URL: [email or web address] Date: 12/20/2023 Prepared by: Sidra Simpers  Exercises - Supine Bridge  - 1 x daily - 7 x weekly - 3 sets - 10 reps - Supine Active Straight Leg Raise  - 1 x daily - 7 x weekly - 3 sets - 10 reps - Supine Hip Adduction Isometric with Ball  - 1 x daily - 7 x weekly - 3 sets - 10 reps - Seated Quad Set  - 1 x daily - 7 x weekly - 3 sets - 10 reps - Hooklying Abduction with Resistance  - 1 x daily - 7 x weekly - 3 sets - 10 reps  Access Code: [email or web address] URL: [email or web address] Date: 01/21/2024 Prepared by: Peggye Linear  Exercises - Sitting Knee Extension with Resistance  - 3 x daily - 1 sets - 20 reps - 3sec hold  ASSESSMENT:  CLINICAL IMPRESSION:   Continued with focal knee strengthening today, used both leg press and knee extension in [facility] with continued education on machine use for eventual transition to independent workouts. TENS used during session to reduce neurogenic inhibition of the quad. Pt will continue to benefit from skilled therapy to address remaining deficits in order to improve overall QoL and return to PLOF.    OBJECTIVE IMPAIRMENTS: Abnormal gait, decreased activity tolerance, decreased balance, decreased knowledge of condition, decreased mobility, difficulty walking, decreased ROM, decreased strength, increased edema, increased fascial restrictions, and pain.   ACTIVITY LIMITATIONS: lifting, bending, sitting, standing, squatting, sleeping, stairs, transfers, bed mobility, dressing, and locomotion level  PARTICIPATION LIMITATIONS: cleaning, laundry, and community activity  PERSONAL FACTORS: Age, Fitness, Sex, Time since onset  of injury/illness/exacerbation, and 3+ comorbidities: DM, HTN, CKD, hyperlipidemia, obesity, OA, OSA on CPAP, hx of bladder cancer, insomnia are also affecting patient's functional outcome.  REHAB POTENTIAL: Good CLINICAL DECISION MAKING: Stable/uncomplicated EVALUATION COMPLEXITY: Low  GOALS: Goals reviewed with patient? Yes  SHORT TERM GOALS: Target date: 01/25/24 Patient will be independent with home exercise program to improve strength/mobility for increased functional independence with ADLs and mobility. Baseline: to be established at future visit  Goal status: INITIAL  LONG TERM GOALS: Target date: 03/07/24 Patient will complete five times sit to stand test (5XSTS) in 12.8 seconds indicating an increased LE strength and improved balance & match age-based norms. Baseline: 18.19 sec; -5xSTS: 10.37sec hands free; 5xSTS: 7.99sec;  Goal status: ACHIEVED  2.  Patient will increase 10 meter walk test to >1.55m/s without antalgic gait or knee buckling as to improve gait speed for better community ambulation and to reduce fall risk. Baseline: normal: 1.11 m/s with antalgic gait; fast: 1.21 m/s with antalgic gait, knee buckling Goal status: INITIAL  3.  Patient will increase lower extremity functional scale to >60/80 to demonstrate improved functional mobility and increased tolerance with ADLs.  Baseline: 40/80; 01/14/24: 84% Goal status: ACHIEVED  4.  Patient will increase six minute walk test distance to >1020ft for progression to community level ambulation, demonstrating improved gait endurance. Baseline: 01/14/24: 1475% Goal status: INITIAL  5.  Patient will be able to complete stair navigation independently without UE support & significant increase in pain.  Baseline:  Goal status: INITIAL  6.  Pt will decrease worst knee pain as reported on NPRS by at least 4 points in order to demonstrate clinically significant reduction in knee pain.  Baseline: 10/10 Goal status:  INITIAL  PLAN:  PT FREQUENCY: 1-2x/week PT DURATION: 12 weeks PLANNED INTERVENTIONS: [id number]- PT Re-evaluation, 97750- Physical Performance Testing, 97110-Therapeutic exercises, 97530- Therapeutic activity, 97112- Neuromuscular re-education, 97535- Self Care, [id number]- Manual therapy, [phone]- Gait training, [phone]- Canalith repositioning, [phone]- Electrical stimulation (manual), [phone] (1-2 muscles), [phone] (3+ muscles)- Dry Needling, Patient/Family education, Balance training, Stair training, Joint mobilization, Scar mobilization, Cryotherapy, and Moist heat  PLAN FOR NEXT SESSION:  -discuss option for [employer] -TENS with Left knee extensor loading progression -add in formal heel raises for home   10:35 AM, 01/28/24 Peggye JAYSON Linear, PT, DPT Physical Therapist - [facility]  Outpatient Physical Therapy- Main Campus [phone]

## 2024-01-31 ENCOUNTER — Ambulatory Visit: Admitting: Physical Therapy

## 2024-01-31 DIAGNOSIS — R269 Unspecified abnormalities of gait and mobility: Secondary | ICD-10-CM

## 2024-01-31 DIAGNOSIS — G8929 Other chronic pain: Secondary | ICD-10-CM

## 2024-01-31 DIAGNOSIS — M25562 Pain in left knee: Secondary | ICD-10-CM | POA: Diagnosis not present

## 2024-01-31 DIAGNOSIS — R2681 Unsteadiness on feet: Secondary | ICD-10-CM

## 2024-01-31 DIAGNOSIS — M6281 Muscle weakness (generalized): Secondary | ICD-10-CM

## 2024-01-31 NOTE — Therapy (Signed)
 OUTPATIENT PHYSICAL THERAPY TREATMENT  Patient Name: Kelsey Mendoza MRN: [id number] DOB:[birth year], 73 y.o., female Today's Date: 01/31/2024  END OF SESSION:    PT End of Session - 01/31/24 1234     Visit Number 13    Number of Visits 24    Date for Recertification  03/06/24    Authorization Type HTA    Progress Note Due on Visit 20    PT Start Time 0847    PT Stop Time 0928    PT Time Calculation (min) 41 min    Activity Tolerance Patient tolerated treatment well;No increased pain    Behavior During Therapy WFL for tasks assessed/performed            Past Medical History:  Diagnosis Date   Bladder cancer (HCC) 2000   resection x 3 and BCG therapy   CAD (coronary artery disease)    Chronic kidney disease, stage 3 (HCC)    Diastolic dysfunction    Diverticulosis    Fatigue    History of bilateral cataract extraction    HLD (hyperlipidemia)    HTN (hypertension)    Insomnia    Long-term current use of immunosuppressive biologic agent    a.) deucravacitinib   Obstructive sleep apnea with dependence on continuous positive airway pressure (CPAP)    Pericardial effusion    Primary osteoarthritis of left knee    Psoriasis of scalp    a.) Tx'd with deucravacitinib   Type 2 diabetes mellitus (HCC)    Past Surgical History:  Procedure Laterality Date   BLADDER SURGERY     x 3 resection bladder cancer   BROW LIFT Bilateral 05/27/2021   Procedure: BLEPHAROPLASTY UPPER EYELID; W/EXCESS SKIN AND BROW PTOSIS REPAIR BILATERAL;  Surgeon: Ashley Greig HERO, MD;  Location: [facility];  Service: Ophthalmology;  Laterality: Bilateral;   CARPAL TUNNEL RELEASE Right 06/06/2017   Procedure: CARPAL TUNNEL RELEASE;  Surgeon: Marchia Drivers, MD;  Location: [facility];  Service: Orthopedics;  Laterality: Right;   CATARACT EXTRACTION W/ INTRAOCULAR LENS  IMPLANT, BILATERAL Bilateral 08/2019   COLONOSCOPY  07/16/2012   EYE SURGERY Left 11/26/2019   Dr. Elner, Vit, Mem Benedetto   I  & D EXTREMITY Right 06/06/2017   Procedure: IRRIGATION AND DEBRIDEMENT EXTREMITY--RIGHT ARM;  Surgeon: Marchia Drivers, MD;  Location: [facility];  Service: Orthopedics;  Laterality: Right;   KNEE ARTHROSCOPY Left 10/06/2019   Procedure: ARTHROSCOPY KNEE,PARTIAL MEDIAL MENISECTOMY,PARTIAL CHONDROPLASTY;  Surgeon: Mardee Lynwood SQUIBB, MD;  Location: [facility];  Service: Orthopedics;  Laterality: Left;   LUMBAR SPINE SURGERY     MEDIAL PARTIAL KNEE REPLACEMENT Right 12/29/2016   Makoplasty   OPEN REDUCTION INTERNAL FIXATION (ORIF) DISTAL RADIAL FRACTURE N/A 06/06/2017   Procedure: OPEN REDUCTION INTERNAL FIXATION (ORIF) DISTAL RADIAL FRACTURE;  Surgeon: Marchia Drivers, MD;  Location: [facility];  Service: Orthopedics;  Laterality: N/A;   TOTAL KNEE ARTHROPLASTY Left 07/16/2023   Procedure: ARTHROPLASTY, KNEE, TOTAL;  Surgeon: Lorelle Hussar, MD;  Location: [facility];  Service: Orthopedics;  Laterality: Left;   VAGINAL HYSTERECTOMY     VITRECTOMY Bilateral    Patient Active Problem List   Diagnosis Date Noted   Neuropathy 01/16/2024   S/P TKR (total knee replacement), left 07/16/2023   Primary osteoarthritis of left knee 06/11/2023   Elevated TSH 04/18/2022   Benign hypertension with CKD (chronic kidney disease) stage III (HCC) 01/17/2022   Hyperlipidemia associated with type 2 diabetes mellitus (HCC) 01/17/2022   OSA on CPAP 06/13/2021   Posterior  capsular opacification, right 03/25/2020   Postoperative follow-up 03/18/2020   Left posterior capsular opacification 02/12/2020   Left epiretinal membrane 11/10/2019   Right epiretinal membrane 11/10/2019   Cystoid macular edema of left eye 11/10/2019   Glaucoma suspect, bilateral 11/10/2019   Cancer (HCC) 10/04/2019   Closed Colles' fracture 07/03/2017   Distal radius fracture, right 06/05/2017   Contusion of left knee 11/27/2016   Contusion of right knee 11/27/2016   Depression 03/26/2014   Depressed affect 09/25/2013   Difficulty sleeping  09/25/2013   Type 2 diabetes mellitus with other specified complication (HCC) 09/03/2013   Diverticular disease 07/17/2012   Diverticulosis of colon 07/17/2012   Arthritis 07/14/2012   Bladder cancer (HCC) 07/14/2012    PCP: Marsa Officer, DO REFERRING PROVIDER: Arthea Sheer, MD REFERRING DIAG: M70.52 (ICD-10-CM) - Other bursitis of knee, left knee  THERAPY DIAG:    Chronic pain of left knee  Abnormality of gait  Muscle weakness (generalized)  Unsteadiness on feet  Rationale for Evaluation and Treatment: Rehabilitation ONSET DATE: L TKA 07/16/23; knee pain noted at physician visit 10/26/23.   SUBJECTIVE:   SUBJECTIVE STATEMENT:    Pt reports that her HA is feeling much better today, mediciation was very effective.   [facility] membership established; feeling prepared for d/c soon.   PERTINENT HISTORY: The patient had been doing fantastic after her left total knee replacement. She has developed some pes anserine bursitis and hamstring tendinitis after increasing her activities. There is no instability or increased motion in flexion on her exam. We discussed treatment options for the tendinitis including topical medications, local steroid injections, bracing, physical therapy and home exercises. Patient would like to try some anti-inflammatories and a local injection and do some physical therapy for the knee. Will also give her a brace to help calm down the inflammation. We will see how she does with these interventions and have her follow-up in a few months for repeat evaluation. All questions answered she agrees with above plan. PMH: DM, HTN, CKD, hyperlipidemia, obesity, OA, OSA on CPAP, hx of bladder cancer, insomnia  PAIN:  Are you having pain? No knee pain, no left groin pain.   PRECAUTIONS: None  WEIGHT BEARING RESTRICTIONS: No  FALLS:  Has patient fallen in last 6 months? No  LIVING ENVIRONMENT: Lives with: lives alone Lives in: House/apartment Stairs:  No Has following equipment at home: Vannie - 2 wheeled, Tour manager, Grab bars, and raised commode  OCCUPATION: retired Lexicographer, former LPN  PLOF: Independent  PATIENT GOALS:  reduce knee pain; get stronger; would like to be more active, be able to walk dog   OBJECTIVE:  Note: Objective measures were completed at Evaluation unless otherwise noted.  DIAGNOSTIC FINDINGS:  Per ortho MD note 11/28/23: 3 view x-rays AP, lateral, sunrise of the left knee ordered and taken today in clinic and images reviewed by myself show status post total knee arthroplasty with components in appropriate position. Patella tracking midline. No evidence of periprosthetic loosening or fracture.    LOWER EXTREMITY MMT:  MMT Right eval Left eval  Hip flexion 4- 3+*  Hip abduction 4+ 4+  Hip adduction 4+ 4+  Knee flexion 4+ 4-  Knee extension 4+ 4-  Ankle dorsiflexion 4- 4-  Ankle plantarflexion 5 5   (Blank rows = not tested) *=pain reported by pt  10RM Strength Assessment BLE: 01/17/24: 10RM testing in [facility] -Knee extension machine:   *Rt knee (nonsurgical) plate 2, 12 reps   *Lt knee, plate  1 (12 reps, slight lower RPE) -Knee flexion machine:   *Rt knee, plate 3, 13 reps   *Lt knee, plate 3, 16 reps  -single leg heel raise: 4x bilat -wall leaning ankle DFx12 bilat, equal velocity and amplitude bilat                                                                                                                               TREATMENT DATE: 01/31/2024 In order to prepare for d/c, pt electing to complete session in [facility]; education throughout session on set up of equipment, when to progress with exercises via increased resistance.   -AMB to [facility] -[facility] knee extension 3x12 each LE @ 2 plates single legs knee extension bilat -[facility] leg press seat 4, plates 6: [id number] BLEs -1x12 single LE leg press, each LE, plate 3 - educated on Nustep set up; introduction to different  programs  -pt completing 220 steps in 3 min  -added knee extension & leg press to HEP, with note of seat settings, current level of resistance.  Discussed POC to include updating HEP next session for increased progression.   PATIENT EDUCATION:  Education details: POC as related to future sessions, implementation of HEP at next session Person educated: Patient Education method: Explanation Education comprehension: verbalized understanding and needs further education  HOME EXERCISE PROGRAM:   Access Code: [email or web address] URL: [email or web address] Date: 01/31/2024 Prepared by: Chiquita Silvan  Exercises - Supine Bridge  - 1 x daily - 7 x weekly - 3 sets - 10 reps - Supine Active Straight Leg Raise  - 1 x daily - 7 x weekly - 3 sets - 10 reps - Supine Hip Adduction Isometric with Ball  - 1 x daily - 7 x weekly - 3 sets - 10 reps - Seated Quad Set  - 1 x daily - 7 x weekly - 3 sets - 10 reps - Hooklying Abduction with Resistance  - 1 x daily - 7 x weekly - 3 sets - 10 reps - Full Leg Press  - 1 x daily - 7 x weekly - 3 sets - 12 reps - Single Leg Knee Extension with Weight Machine  - 1 x daily - 7 x weekly - 3 sets - 12 reps  Access Code: [email or web address] URL: [email or web address] Date: 01/21/2024 Prepared by: Peggye Linear  Exercises - Sitting Knee Extension with Resistance  - 3 x daily - 1 sets - 20 reps - 3sec hold  ASSESSMENT:  CLINICAL IMPRESSION:    Continued with focal knee strengthening today, used both leg press and knee extension in [facility] with continued education on machine use for eventual transition to independent workouts; added to HEP to include current seat settings & resistance. Plan to update HEP at next session to continue to promote self management. Pt will continue to benefit from skilled therapy to address remaining deficits in order to improve overall QoL and return to PLOF.    OBJECTIVE IMPAIRMENTS: Abnormal  gait, decreased activity  tolerance, decreased balance, decreased knowledge of condition, decreased mobility, difficulty walking, decreased ROM, decreased strength, increased edema, increased fascial restrictions, and pain.   ACTIVITY LIMITATIONS: lifting, bending, sitting, standing, squatting, sleeping, stairs, transfers, bed mobility, dressing, and locomotion level  PARTICIPATION LIMITATIONS: cleaning, laundry, and community activity  PERSONAL FACTORS: Age, Fitness, Sex, Time since onset of injury/illness/exacerbation, and 3+ comorbidities: DM, HTN, CKD, hyperlipidemia, obesity, OA, OSA on CPAP, hx of bladder cancer, insomnia are also affecting patient's functional outcome.  REHAB POTENTIAL: Good CLINICAL DECISION MAKING: Stable/uncomplicated EVALUATION COMPLEXITY: Low   GOALS: Goals reviewed with patient? Yes  SHORT TERM GOALS: Target date: 01/25/24 Patient will be independent with home exercise program to improve strength/mobility for increased functional independence with ADLs and mobility. Baseline: to be established at future visit  Goal status: INITIAL  LONG TERM GOALS: Target date: 03/07/24 Patient will complete five times sit to stand test (5XSTS) in 12.8 seconds indicating an increased LE strength and improved balance & match age-based norms. Baseline: 18.19 sec; -5xSTS: 10.37sec hands free; 5xSTS: 7.99sec;  Goal status: ACHIEVED  2.  Patient will increase 10 meter walk test to >1.15m/s without antalgic gait or knee buckling as to improve gait speed for better community ambulation and to reduce fall risk. Baseline: normal: 1.11 m/s with antalgic gait; fast: 1.21 m/s with antalgic gait, knee buckling Goal status: INITIAL  3.  Patient will increase lower extremity functional scale to >60/80 to demonstrate improved functional mobility and increased tolerance with ADLs.  Baseline: 40/80; 01/14/24: 84% Goal status: ACHIEVED  4.  Patient will increase six minute walk test distance to >10109ft for  progression to community level ambulation, demonstrating improved gait endurance. Baseline: 01/14/24: 1475% Goal status: INITIAL  5.  Patient will be able to complete stair navigation independently without UE support & significant increase in pain.  Baseline:  Goal status: INITIAL  6.  Pt will decrease worst knee pain as reported on NPRS by at least 4 points in order to demonstrate clinically significant reduction in knee pain.  Baseline: 10/10 Goal status: INITIAL  PLAN:  PT FREQUENCY: 1-2x/week PT DURATION: 12 weeks PLANNED INTERVENTIONS: [id number]- PT Re-evaluation, 97750- Physical Performance Testing, 97110-Therapeutic exercises, 97530- Therapeutic activity, 97112- Neuromuscular re-education, 97535- Self Care, [id number]- Manual therapy, [phone]- Gait training, [phone]- Canalith repositioning, [phone]- Electrical stimulation (manual), [phone] (1-2 muscles), [phone] (3+ muscles)- Dry Needling, Patient/Family education, Balance training, Stair training, Joint mobilization, Scar mobilization, Cryotherapy, and Moist heat  PLAN FOR NEXT SESSION:  -update HEP -TENS with Left knee extensor loading progression -add in formal heel raises for home   12:35 PM, 01/31/24 Chiquita Silvan, SPT Physical Therapy Student -   [facility]

## 2024-02-04 ENCOUNTER — Ambulatory Visit: Admitting: Physical Therapy

## 2024-02-04 DIAGNOSIS — R269 Unspecified abnormalities of gait and mobility: Secondary | ICD-10-CM

## 2024-02-04 DIAGNOSIS — M25562 Pain in left knee: Secondary | ICD-10-CM | POA: Diagnosis not present

## 2024-02-04 DIAGNOSIS — G8929 Other chronic pain: Secondary | ICD-10-CM

## 2024-02-04 DIAGNOSIS — R2681 Unsteadiness on feet: Secondary | ICD-10-CM

## 2024-02-04 DIAGNOSIS — M6281 Muscle weakness (generalized): Secondary | ICD-10-CM

## 2024-02-04 NOTE — Therapy (Signed)
 OUTPATIENT PHYSICAL THERAPY TREATMENT  Patient Name: Kelsey Mendoza MRN: [id number] DOB:1951/04/08, 73 y.o., female Today's Date: 02/04/2024  END OF SESSION:    PT End of Session - 02/04/24 1305     Visit Number 14    Number of Visits 24    Date for Recertification  03/06/24    Authorization Type HTA    Progress Note Due on Visit 20    PT Start Time 0930    PT Stop Time 1013    PT Time Calculation (min) 43 min    Activity Tolerance Patient tolerated treatment well;No increased pain    Behavior During Therapy WFL for tasks assessed/performed             Past Medical History:  Diagnosis Date   Bladder cancer (HCC) 2000   resection x 3 and BCG therapy   CAD (coronary artery disease)    Chronic kidney disease, stage 3 (HCC)    Diastolic dysfunction    Diverticulosis    Fatigue    History of bilateral cataract extraction    HLD (hyperlipidemia)    HTN (hypertension)    Insomnia    Long-term current use of immunosuppressive biologic agent    a.) deucravacitinib   Obstructive sleep apnea with dependence on continuous positive airway pressure (CPAP)    Pericardial effusion    Primary osteoarthritis of left knee    Psoriasis of scalp    a.) Tx'd with deucravacitinib   Type 2 diabetes mellitus (HCC)    Past Surgical History:  Procedure Laterality Date   BLADDER SURGERY     x 3 resection bladder cancer   BROW LIFT Bilateral 05/27/2021   Procedure: BLEPHAROPLASTY UPPER EYELID; W/EXCESS SKIN AND BROW PTOSIS REPAIR BILATERAL;  Surgeon: Ashley Greig HERO, MD;  Location: [facility];  Service: Ophthalmology;  Laterality: Bilateral;   CARPAL TUNNEL RELEASE Right 06/06/2017   Procedure: CARPAL TUNNEL RELEASE;  Surgeon: Marchia Drivers, MD;  Location: [facility];  Service: Orthopedics;  Laterality: Right;   CATARACT EXTRACTION W/ INTRAOCULAR LENS  IMPLANT, BILATERAL Bilateral 08/2019   COLONOSCOPY  07/16/2012   EYE SURGERY Left 11/26/2019   Dr. Elner, Vit, Mem Benedetto    I & D EXTREMITY Right 06/06/2017   Procedure: IRRIGATION AND DEBRIDEMENT EXTREMITY--RIGHT ARM;  Surgeon: Marchia Drivers, MD;  Location: [facility];  Service: Orthopedics;  Laterality: Right;   KNEE ARTHROSCOPY Left 10/06/2019   Procedure: ARTHROSCOPY KNEE,PARTIAL MEDIAL MENISECTOMY,PARTIAL CHONDROPLASTY;  Surgeon: Mardee Lynwood SQUIBB, MD;  Location: [facility];  Service: Orthopedics;  Laterality: Left;   LUMBAR SPINE SURGERY     MEDIAL PARTIAL KNEE REPLACEMENT Right 12/29/2016   Makoplasty   OPEN REDUCTION INTERNAL FIXATION (ORIF) DISTAL RADIAL FRACTURE N/A 06/06/2017   Procedure: OPEN REDUCTION INTERNAL FIXATION (ORIF) DISTAL RADIAL FRACTURE;  Surgeon: Marchia Drivers, MD;  Location: [facility];  Service: Orthopedics;  Laterality: N/A;   TOTAL KNEE ARTHROPLASTY Left 07/16/2023   Procedure: ARTHROPLASTY, KNEE, TOTAL;  Surgeon: Lorelle Hussar, MD;  Location: [facility];  Service: Orthopedics;  Laterality: Left;   VAGINAL HYSTERECTOMY     VITRECTOMY Bilateral    Patient Active Problem List   Diagnosis Date Noted   Neuropathy 01/16/2024   S/P TKR (total knee replacement), left 07/16/2023   Primary osteoarthritis of left knee 06/11/2023   Elevated TSH 04/18/2022   Benign hypertension with CKD (chronic kidney disease) stage III (HCC) 01/17/2022   Hyperlipidemia associated with type 2 diabetes mellitus (HCC) 01/17/2022   OSA on CPAP 06/13/2021  Posterior capsular opacification, right 03/25/2020   Postoperative follow-up 03/18/2020   Left posterior capsular opacification 02/12/2020   Left epiretinal membrane 11/10/2019   Right epiretinal membrane 11/10/2019   Cystoid macular edema of left eye 11/10/2019   Glaucoma suspect, bilateral 11/10/2019   Cancer (HCC) 10/04/2019   Closed Colles' fracture 07/03/2017   Distal radius fracture, right 06/05/2017   Contusion of left knee 11/27/2016   Contusion of right knee 11/27/2016   Depression 03/26/2014   Depressed affect 09/25/2013   Difficulty sleeping  09/25/2013   Type 2 diabetes mellitus with other specified complication (HCC) 09/03/2013   Diverticular disease 07/17/2012   Diverticulosis of colon 07/17/2012   Arthritis 07/14/2012   Bladder cancer (HCC) 07/14/2012    PCP: Marsa Officer, DO REFERRING PROVIDER: Arthea Sheer, MD REFERRING DIAG: M70.52 (ICD-10-CM) - Other bursitis of knee, left knee  THERAPY DIAG:    Chronic pain of left knee  Abnormality of gait  Muscle weakness (generalized)  Unsteadiness on feet  Rationale for Evaluation and Treatment: Rehabilitation ONSET DATE: L TKA 07/16/23; knee pain noted at physician visit 10/26/23.   SUBJECTIVE:   SUBJECTIVE STATEMENT:    Pt reports that she had a quiet weekend. Is eager to progress with PT.     PERTINENT HISTORY: The patient had been doing fantastic after her left total knee replacement. She has developed some pes anserine bursitis and hamstring tendinitis after increasing her activities. There is no instability or increased motion in flexion on her exam. We discussed treatment options for the tendinitis including topical medications, local steroid injections, bracing, physical therapy and home exercises. Patient would like to try some anti-inflammatories and a local injection and do some physical therapy for the knee. Will also give her a brace to help calm down the inflammation. We will see how she does with these interventions and have her follow-up in a few months for repeat evaluation. All questions answered she agrees with above plan. PMH: DM, HTN, CKD, hyperlipidemia, obesity, OA, OSA on CPAP, hx of bladder cancer, insomnia  PAIN:  Are you having pain? No knee pain, no left groin pain.   PRECAUTIONS: None  WEIGHT BEARING RESTRICTIONS: No  FALLS:  Has patient fallen in last 6 months? No  LIVING ENVIRONMENT: Lives with: lives alone Lives in: House/apartment Stairs: No Has following equipment at home: Vannie - 2 wheeled, Tour manager, Grab bars,  and raised commode  OCCUPATION: retired lexicographer, former LPN  PLOF: Independent  PATIENT GOALS:  reduce knee pain; get stronger; would like to be more active, be able to walk dog   OBJECTIVE:  Note: Objective measures were completed at Evaluation unless otherwise noted.  DIAGNOSTIC FINDINGS:  Per ortho MD note 11/28/23: 3 view x-rays AP, lateral, sunrise of the left knee ordered and taken today in clinic and images reviewed by myself show status post total knee arthroplasty with components in appropriate position. Patella tracking midline. No evidence of periprosthetic loosening or fracture.    LOWER EXTREMITY MMT:  MMT Right eval Left eval  Hip flexion 4- 3+*  Hip abduction 4+ 4+  Hip adduction 4+ 4+  Knee flexion 4+ 4-  Knee extension 4+ 4-  Ankle dorsiflexion 4- 4-  Ankle plantarflexion 5 5   (Blank rows = not tested) *=pain reported by pt  10RM Strength Assessment BLE: 01/17/24: 10RM testing in wellzone -Knee extension machine:   *Rt knee (nonsurgical) plate 2, 12 reps   *Lt knee, plate 1 (12 reps, slight lower RPE) -Knee  flexion machine:   *Rt knee, plate 3, 13 reps   *Lt knee, plate 3, 16 reps  -single leg heel raise: 4x bilat -wall leaning ankle DFx12 bilat, equal velocity and amplitude bilat                                                                                                                               TREATMENT DATE: 02/04/2024 In order to prepare for d/c, pt electing to complete session in [facility]; education throughout session on set up of equipment, when to progress with exercises via increased resistance.   TA- To improve functional movements patterns for everyday tasks   Nustep level 3-7 double hill interval training mode x 8 min   TE- To improve strength, endurance, mobility, and function of specific targeted muscle groups or improve joint range of motion or improve muscle flexibility  -AMB to [facility] - Single Leg Knee  Extension with Weight Machine 3 sets - 12 reps- level 2 plate  -for L LE completed double leg concentric and single leg eccentric portion as pt could handle load otherwise through full ROM - Hamstring Curl with Weight Machine   3 sets - 12 reps - plate 3  -Full Leg Press 3 sets - 12 reps - plate 6  - Single Leg Press  2 sets - 12 reps - plate 3  - Heel Raises with Leg Press   3 sets - 15 reps - plate 4 - Row machine   - 3 sets - 12 reps - plate 2, narrow grip  Instructed pt in proper set up on machines that were new to her or exercises that were new but with machines she was familiar with she was fairly independent with set up  PATIENT EDUCATION:  Education details: POC as related to future sessions, implementation of HEP at next session Person educated: Patient Education method: Explanation Education comprehension: verbalized understanding and needs further education  HOME EXERCISE PROGRAM:   Access Code: [email or web address] URL: [email or web address] Date: 02/04/2024 Prepared by: Lonni Gainer  Exercises - Supine Bridge  - 1 x daily - 7 x weekly - 3 sets - 10 reps - Supine Active Straight Leg Raise  - 1 x daily - 7 x weekly - 3 sets - 10 reps - Supine Hip Adduction Isometric with Ball  - 1 x daily - 7 x weekly - 3 sets - 10 reps - Hooklying Abduction with Resistance  - 1 x daily - 7 x weekly - 3 sets - 10 reps - Full Leg Press  - 1 x daily - 3 x weekly - 3 sets - 12 reps - Single Leg Knee Extension with Weight Machine  - 1 x daily - 3 x weekly - 3 sets - 12 reps - Hamstring Curl with Weight Machine  - 1 x daily - 3 x weekly - 3 sets - 12 reps - Single Leg Press  - 1 x daily - 3 x  weekly - 2 sets - 12 reps - Heel Raises with Leg Press  - 1 x daily - 3 x weekly - 3 sets - 15 reps - Row  - 1 x daily - 3 x weekly - 3 sets - 12 reps - Nustep level 4-5 x 15 minutes   - 1 x daily - 3 x weekly   ASSESSMENT:  CLINICAL IMPRESSION:     Patient arrived with good motivation for  completion of pt activities.  Discussed discharge in next few visits with patient. Would like to formally test balance with higher level balance test prior to formal discharge. Pt very motivated to complete activities in home and gym based setting to attenuate her progress.  Pt will continue to benefit from skilled therapy to address remaining deficits in order to improve overall QoL and return to PLOF.    OBJECTIVE IMPAIRMENTS: Abnormal gait, decreased activity tolerance, decreased balance, decreased knowledge of condition, decreased mobility, difficulty walking, decreased ROM, decreased strength, increased edema, increased fascial restrictions, and pain.   ACTIVITY LIMITATIONS: lifting, bending, sitting, standing, squatting, sleeping, stairs, transfers, bed mobility, dressing, and locomotion level  PARTICIPATION LIMITATIONS: cleaning, laundry, and community activity  PERSONAL FACTORS: Age, Fitness, Sex, Time since onset of injury/illness/exacerbation, and 3+ comorbidities: DM, HTN, CKD, hyperlipidemia, obesity, OA, OSA on CPAP, hx of bladder cancer, insomnia are also affecting patient's functional outcome.  REHAB POTENTIAL: Good CLINICAL DECISION MAKING: Stable/uncomplicated EVALUATION COMPLEXITY: Low   GOALS: Goals reviewed with patient? Yes  SHORT TERM GOALS: Target date: 01/25/24 Patient will be independent with home exercise program to improve strength/mobility for increased functional independence with ADLs and mobility. Baseline: to be established at future visit  Goal status: INITIAL  LONG TERM GOALS: Target date: 03/07/24 Patient will complete five times sit to stand test (5XSTS) in 12.8 seconds indicating an increased LE strength and improved balance & match age-based norms. Baseline: 18.19 sec; -5xSTS: 10.37sec hands free; 5xSTS: 7.99sec;  Goal status: ACHIEVED  2.  Patient will increase 10 meter walk test to >1.50m/s without antalgic gait or knee buckling as to improve gait  speed for better community ambulation and to reduce fall risk. Baseline: normal: 1.11 m/s with antalgic gait; fast: 1.21 m/s with antalgic gait, knee buckling Goal status: INITIAL  3.  Patient will increase lower extremity functional scale to >60/80 to demonstrate improved functional mobility and increased tolerance with ADLs.  Baseline: 40/80; 01/14/24: 84% Goal status: ACHIEVED  4.  Patient will increase six minute walk test distance to >1040ft for progression to community level ambulation, demonstrating improved gait endurance. Baseline: 01/14/24: 1475% Goal status: INITIAL  5.  Patient will be able to complete stair navigation independently without UE support & significant increase in pain.  Baseline:  Goal status: INITIAL  6.  Pt will decrease worst knee pain as reported on NPRS by at least 4 points in order to demonstrate clinically significant reduction in knee pain.  Baseline: 10/10 Goal status: INITIAL  PLAN:  PT FREQUENCY: 1-2x/week PT DURATION: 12 weeks PLANNED INTERVENTIONS: [id number]- PT Re-evaluation, 97750- Physical Performance Testing, 97110-Therapeutic exercises, 97530- Therapeutic activity, [id number]- Neuromuscular re-education, 97535- Self Care, [id number]- Manual therapy, [id number]- Gait training, [phone]- Canalith repositioning, [id number]- Electrical stimulation (manual), [phone] (1-2 muscles), [id number] (3+ muscles)- Dry Needling, Patient/Family education, Balance training, Stair training, Joint mobilization, Scar mobilization, Cryotherapy, and Moist heat  PLAN FOR NEXT SESSION:  -TENS with Left knee extensor loading progression -add in formal heel raises for home - FGA for balance assessment prior to  potential discharge, if cleared consider discharge from PT.   1:06 PM, 02/04/24 Note: Portions of this document were prepared using Dragon voice recognition software and although reviewed may contain unintentional dictation errors in syntax, grammar, or spelling.  Lonni KATHEE Gainer PT  ,DPT Physical Therapist- [place]  [facility]

## 2024-02-07 ENCOUNTER — Ambulatory Visit

## 2024-02-07 DIAGNOSIS — R2681 Unsteadiness on feet: Secondary | ICD-10-CM

## 2024-02-07 DIAGNOSIS — G8929 Other chronic pain: Secondary | ICD-10-CM

## 2024-02-07 DIAGNOSIS — M6281 Muscle weakness (generalized): Secondary | ICD-10-CM

## 2024-02-07 DIAGNOSIS — M25562 Pain in left knee: Secondary | ICD-10-CM | POA: Diagnosis not present

## 2024-02-07 DIAGNOSIS — R269 Unspecified abnormalities of gait and mobility: Secondary | ICD-10-CM

## 2024-02-07 NOTE — Therapy (Signed)
 OUTPATIENT PHYSICAL THERAPY TREATMENT  Patient Name: Kelsey Mendoza MRN: [id number] DOB:1951-03-19, 73 y.o., female Today's Date: 02/07/2024  END OF SESSION:    PT End of Session - 02/07/24 1156     Visit Number 15    Number of Visits 24    Date for Recertification  03/06/24    Authorization Type HTA    Progress Note Due on Visit 20    PT Start Time 1145    PT Stop Time 1215    PT Time Calculation (min) 30 min    Equipment Utilized During Treatment Gait belt    Activity Tolerance Patient tolerated treatment well;No increased pain    Behavior During Therapy WFL for tasks assessed/performed             Past Medical History:  Diagnosis Date   Bladder cancer (HCC) 2000   resection x 3 and BCG therapy   CAD (coronary artery disease)    Chronic kidney disease, stage 3 (HCC)    Diastolic dysfunction    Diverticulosis    Fatigue    History of bilateral cataract extraction    HLD (hyperlipidemia)    HTN (hypertension)    Insomnia    Long-term current use of immunosuppressive biologic agent    a.) deucravacitinib   Obstructive sleep apnea with dependence on continuous positive airway pressure (CPAP)    Pericardial effusion    Primary osteoarthritis of left knee    Psoriasis of scalp    a.) Tx'd with deucravacitinib   Type 2 diabetes mellitus (HCC)    Past Surgical History:  Procedure Laterality Date   BLADDER SURGERY     x 3 resection bladder cancer   BROW LIFT Bilateral 05/27/2021   Procedure: BLEPHAROPLASTY UPPER EYELID; W/EXCESS SKIN AND BROW PTOSIS REPAIR BILATERAL;  Surgeon: Ashley Greig HERO, MD;  Location: [facility];  Service: Ophthalmology;  Laterality: Bilateral;   CARPAL TUNNEL RELEASE Right 06/06/2017   Procedure: CARPAL TUNNEL RELEASE;  Surgeon: Marchia Drivers, MD;  Location: [facility];  Service: Orthopedics;  Laterality: Right;   CATARACT EXTRACTION W/ INTRAOCULAR LENS  IMPLANT, BILATERAL Bilateral 08/2019   COLONOSCOPY  07/16/2012   EYE  SURGERY Left 11/26/2019   Dr. Elner, Vit, Mem Benedetto   I & D EXTREMITY Right 06/06/2017   Procedure: IRRIGATION AND DEBRIDEMENT EXTREMITY--RIGHT ARM;  Surgeon: Marchia Drivers, MD;  Location: [facility];  Service: Orthopedics;  Laterality: Right;   KNEE ARTHROSCOPY Left 10/06/2019   Procedure: ARTHROSCOPY KNEE,PARTIAL MEDIAL MENISECTOMY,PARTIAL CHONDROPLASTY;  Surgeon: Mardee Lynwood SQUIBB, MD;  Location: [facility];  Service: Orthopedics;  Laterality: Left;   LUMBAR SPINE SURGERY     MEDIAL PARTIAL KNEE REPLACEMENT Right 12/29/2016   Makoplasty   OPEN REDUCTION INTERNAL FIXATION (ORIF) DISTAL RADIAL FRACTURE N/A 06/06/2017   Procedure: OPEN REDUCTION INTERNAL FIXATION (ORIF) DISTAL RADIAL FRACTURE;  Surgeon: Marchia Drivers, MD;  Location: [facility];  Service: Orthopedics;  Laterality: N/A;   TOTAL KNEE ARTHROPLASTY Left 07/16/2023   Procedure: ARTHROPLASTY, KNEE, TOTAL;  Surgeon: Lorelle Hussar, MD;  Location: [facility];  Service: Orthopedics;  Laterality: Left;   VAGINAL HYSTERECTOMY     VITRECTOMY Bilateral    Patient Active Problem List   Diagnosis Date Noted   Neuropathy 01/16/2024   S/P TKR (total knee replacement), left 07/16/2023   Primary osteoarthritis of left knee 06/11/2023   Elevated TSH 04/18/2022   Benign hypertension with CKD (chronic kidney disease) stage III (HCC) 01/17/2022   Hyperlipidemia associated with type 2 diabetes mellitus (HCC)  01/17/2022   OSA on CPAP 06/13/2021   Posterior capsular opacification, right 03/25/2020   Postoperative follow-up 03/18/2020   Left posterior capsular opacification 02/12/2020   Left epiretinal membrane 11/10/2019   Right epiretinal membrane 11/10/2019   Cystoid macular edema of left eye 11/10/2019   Glaucoma suspect, bilateral 11/10/2019   Cancer (HCC) 10/04/2019   Closed Colles' fracture 07/03/2017   Distal radius fracture, right 06/05/2017   Contusion of left knee 11/27/2016   Contusion of right knee 11/27/2016   Depression  03/26/2014   Depressed affect 09/25/2013   Difficulty sleeping 09/25/2013   Type 2 diabetes mellitus with other specified complication (HCC) 09/03/2013   Diverticular disease 07/17/2012   Diverticulosis of colon 07/17/2012   Arthritis 07/14/2012   Bladder cancer (HCC) 07/14/2012    PCP: Marsa Officer, DO REFERRING PROVIDER: Arthea Sheer, MD REFERRING DIAG: M70.52 (ICD-10-CM) - Other bursitis of knee, left knee  THERAPY DIAG:    Abnormality of gait  Muscle weakness (generalized)  Unsteadiness on feet  Chronic pain of left knee  Rationale for Evaluation and Treatment: Rehabilitation ONSET DATE: L TKA 07/16/23; knee pain noted at physician visit 10/26/23.   SUBJECTIVE:   SUBJECTIVE STATEMENT:    Pt has a pretty bad headache today, 7/10. She feels this may limited her ability to focus or tolerate much exertion today. Pt has not taken her medication for this. Pt reports joining the wellzone.   PERTINENT HISTORY: The patient had been doing fantastic after her left total knee replacement. She has developed some pes anserine bursitis and hamstring tendinitis after increasing her activities. There is no instability or increased motion in flexion on her exam. We discussed treatment options for the tendinitis including topical medications, local steroid injections, bracing, physical therapy and home exercises. Patient would like to try some anti-inflammatories and a local injection and do some physical therapy for the knee. Will also give her a brace to help calm down the inflammation. We will see how she does with these interventions and have her follow-up in a few months for repeat evaluation. All questions answered she agrees with above plan. PMH: DM, HTN, CKD, hyperlipidemia, obesity, OA, OSA on CPAP, hx of bladder cancer, insomnia  PAIN:  Are you having pain? 7/10 headache pain   PRECAUTIONS: None  WEIGHT BEARING RESTRICTIONS: No  FALLS:  Has patient fallen in last 6  months? No  LIVING ENVIRONMENT: Lives with: lives alone Lives in: House/apartment Stairs: No Has following equipment at home: Vannie - 2 wheeled, Tour manager, Grab bars, and raised commode  OCCUPATION: retired lexicographer, former LPN  PLOF: Independent  PATIENT GOALS:  reduce knee pain; get stronger; would like to be more active, be able to walk dog   OBJECTIVE:  Note: Objective measures were completed at Evaluation unless otherwise noted.  DIAGNOSTIC FINDINGS:  Per ortho MD note 11/28/23: 3 view x-rays AP, lateral, sunrise of the left knee ordered and taken today in clinic and images reviewed by myself show status post total knee arthroplasty with components in appropriate position. Patella tracking midline. No evidence of periprosthetic loosening or fracture.    LOWER EXTREMITY MMT:  MMT Right eval Left eval  Hip flexion 4- 3+*  Hip abduction 4+ 4+  Hip adduction 4+ 4+  Knee flexion 4+ 4-  Knee extension 4+ 4-  Ankle dorsiflexion 4- 4-  Ankle plantarflexion 5 5   (Blank rows = not tested) *=pain reported by pt  10RM Strength Assessment BLE: 01/17/24: 10RM testing in wellzone -  Knee extension machine:   *Rt knee (nonsurgical) plate 2, 12 reps   *Lt knee, plate 1 (12 reps, slight lower RPE) -Knee flexion machine:   *Rt knee, plate 3, 13 reps   *Lt knee, plate 3, 16 reps  -single leg heel raise: 4x bilat -wall leaning ankle DFx12 bilat, equal velocity and amplitude bilat                                                                                                                               TREATMENT DATE: 02/07/2024 -BP assessment seated: 153/76mmHg (running high per patient) -discussion of plan for preparing for DC: will return Monday for a dress rehearsal of independent gym program, then return in 2 weeks to report on successes and challenges over that time.  -in wellzone: pt demonstrated ability to set up all adjustable components and seat settings  on 3 different machines; author helped pt establish appropriate arc setting for leg extension range and hamstrings curl range   PATIENT EDUCATION:  Education details: POC as related to future sessions, implementation of HEP at next session Person educated: Patient Education method: Explanation Education comprehension: verbalized understanding and needs further education  HOME EXERCISE PROGRAM:   Access Code: [email or web address] URL: [email or web address] Date: 02/04/2024 Prepared by: Lonni Gainer  Exercises - Supine Bridge  - 1 x daily - 7 x weekly - 3 sets - 10 reps - Supine Active Straight Leg Raise  - 1 x daily - 7 x weekly - 3 sets - 10 reps - Supine Hip Adduction Isometric with Ball  - 1 x daily - 7 x weekly - 3 sets - 10 reps - Hooklying Abduction with Resistance  - 1 x daily - 7 x weekly - 3 sets - 10 reps - Full Leg Press  - 1 x daily - 3 x weekly - 3 sets - 12 reps - Single Leg Knee Extension with Weight Machine  - 1 x daily - 3 x weekly - 3 sets - 12 reps - Hamstring Curl with Weight Machine  - 1 x daily - 3 x weekly - 3 sets - 12 reps - Single Leg Press  - 1 x daily - 3 x weekly - 2 sets - 12 reps - Heel Raises with Leg Press  - 1 x daily - 3 x weekly - 3 sets - 15 reps - Row  - 1 x daily - 3 x weekly - 3 sets - 12 reps - Nustep level 4-5 x 15 minutes   - 1 x daily - 3 x weekly   ASSESSMENT:  CLINICAL IMPRESSION:     Pt able to demonstrate correct machine setup in wellzone today, assisted with apporpriate settings for 2 exercises, pt writes these down. Pt unable to participate in full setting due to return headache. Pt will plan on comoplete program run through next session prior to 2 weeks of independent performance. May consider DC upon return pending progress with  independent program. Pt will continue to benefit from skilled therapy to address remaining deficits in order to improve overall QoL and return to PLOF.    OBJECTIVE IMPAIRMENTS: Abnormal gait,  decreased activity tolerance, decreased balance, decreased knowledge of condition, decreased mobility, difficulty walking, decreased ROM, decreased strength, increased edema, increased fascial restrictions, and pain.   ACTIVITY LIMITATIONS: lifting, bending, sitting, standing, squatting, sleeping, stairs, transfers, bed mobility, dressing, and locomotion level  PARTICIPATION LIMITATIONS: cleaning, laundry, and community activity  PERSONAL FACTORS: Age, Fitness, Sex, Time since onset of injury/illness/exacerbation, and 3+ comorbidities: DM, HTN, CKD, hyperlipidemia, obesity, OA, OSA on CPAP, hx of bladder cancer, insomnia are also affecting patient's functional outcome.  REHAB POTENTIAL: Good CLINICAL DECISION MAKING: Stable/uncomplicated EVALUATION COMPLEXITY: Low   GOALS: Goals reviewed with patient? Yes  SHORT TERM GOALS: Target date: 01/25/24 Patient will be independent with home exercise program to improve strength/mobility for increased functional independence with ADLs and mobility. Baseline: to be established at future visit  Goal status: INITIAL  LONG TERM GOALS: Target date: 03/07/24 Patient will complete five times sit to stand test (5XSTS) in 12.8 seconds indicating an increased LE strength and improved balance & match age-based norms. Baseline: 18.19 sec; -5xSTS: 10.37sec hands free; 5xSTS: 7.99sec;  Goal status: ACHIEVED  2.  Patient will increase 10 meter walk test to >1.41m/s without antalgic gait or knee buckling as to improve gait speed for better community ambulation and to reduce fall risk. Baseline: normal: 1.11 m/s with antalgic gait; fast: 1.21 m/s with antalgic gait, knee buckling Goal status: INITIAL  3.  Patient will increase lower extremity functional scale to >60/80 to demonstrate improved functional mobility and increased tolerance with ADLs.  Baseline: 40/80; 01/14/24: 84% Goal status: ACHIEVED  4.  Patient will increase six minute walk test distance to  >1021ft for progression to community level ambulation, demonstrating improved gait endurance. Baseline: 01/14/24: 1475% Goal status: INITIAL  5.  Patient will be able to complete stair navigation independently without UE support & significant increase in pain.  Baseline:  Goal status: INITIAL  6.  Pt will decrease worst knee pain as reported on NPRS by at least 4 points in order to demonstrate clinically significant reduction in knee pain.  Baseline: 10/10 Goal status: INITIAL  PLAN:  PT FREQUENCY: 1-2x/week PT DURATION: 12 weeks PLANNED INTERVENTIONS: 02835- PT Re-evaluation, 97750- Physical Performance Testing, 97110-Therapeutic exercises, 97530- Therapeutic activity, 97112- Neuromuscular re-education, 97535- Self Care, 02859- Manual therapy, [phone]- Gait training, [phone]- Canalith repositioning, [phone]- Electrical stimulation (manual), [phone] (1-2 muscles), [phone] (3+ muscles)- Dry Needling, Patient/Family education, Balance training, Stair training, Joint mobilization, Scar mobilization, Cryotherapy, and Moist heat  PLAN FOR NEXT SESSION:  -TENS with Left knee extensor loading progression -add in formal heel raises for home - FGA for balance assessment prior to potential discharge, if cleared consider discharge from PT.   12:42 PM, 02/07/24 Peggye JAYSON Linear, PT, DPT Physical Therapist - [facility]  Outpatient Physical Therapy- Main Campus [phone]

## 2024-02-11 ENCOUNTER — Ambulatory Visit: Attending: Orthopedic Surgery

## 2024-02-11 ENCOUNTER — Other Ambulatory Visit: Payer: Self-pay | Admitting: Family Medicine

## 2024-02-11 ENCOUNTER — Telehealth: Payer: Self-pay

## 2024-02-11 ENCOUNTER — Encounter: Payer: Self-pay | Admitting: *Deleted

## 2024-02-11 DIAGNOSIS — G44209 Tension-type headache, unspecified, not intractable: Secondary | ICD-10-CM

## 2024-02-11 DIAGNOSIS — G43909 Migraine, unspecified, not intractable, without status migrainosus: Secondary | ICD-10-CM

## 2024-02-11 DIAGNOSIS — R269 Unspecified abnormalities of gait and mobility: Secondary | ICD-10-CM | POA: Diagnosis not present

## 2024-02-11 DIAGNOSIS — G8929 Other chronic pain: Secondary | ICD-10-CM | POA: Insufficient documentation

## 2024-02-11 DIAGNOSIS — R2681 Unsteadiness on feet: Secondary | ICD-10-CM | POA: Diagnosis not present

## 2024-02-11 DIAGNOSIS — M25562 Pain in left knee: Secondary | ICD-10-CM | POA: Diagnosis not present

## 2024-02-11 DIAGNOSIS — M6281 Muscle weakness (generalized): Secondary | ICD-10-CM | POA: Diagnosis not present

## 2024-02-11 MED ORDER — AMITRIPTYLINE HCL 10 MG PO TABS: 10.0000 mg | ORAL_TABLET | Freq: Every day | ORAL | 0 refills | Status: DC

## 2024-02-11 MED ORDER — RIZATRIPTAN BENZOATE 10 MG PO TBDP: 10.0000 mg | ORAL_TABLET | ORAL | 2 refills | Status: DC | PRN

## 2024-02-11 NOTE — Progress Notes (Signed)
 Kelsey Mendoza                                          MRN: [id number]   02/11/2024   The [facility] Quality Team Specialist reviewed this patient medical record for the purposes of chart review for care gap closure. The following were reviewed: abstraction for care gap closure-glycemic status assessment.    [facility] Quality Team

## 2024-02-11 NOTE — Therapy (Signed)
 OUTPATIENT PHYSICAL THERAPY TREATMENT  Patient Name: Kelsey Mendoza MRN: [id number] DOB:[birth year], 73 y.o., female Today's Date: 02/11/2024  END OF SESSION:    PT End of Session - 02/11/24 1502     Visit Number 16    Number of Visits 24    Date for Recertification  03/06/24    Authorization Type HTA    Progress Note Due on Visit 20    PT Start Time 1020    PT Stop Time 1058    PT Time Calculation (min) 38 min    Activity Tolerance Patient tolerated treatment well;No increased pain    Behavior During Therapy WFL for tasks assessed/performed             Past Medical History:  Diagnosis Date   Bladder cancer (HCC) 2000   resection x 3 and BCG therapy   CAD (coronary artery disease)    Chronic kidney disease, stage 3 (HCC)    Diastolic dysfunction    Diverticulosis    Fatigue    History of bilateral cataract extraction    HLD (hyperlipidemia)    HTN (hypertension)    Insomnia    Long-term current use of immunosuppressive biologic agent    a.) deucravacitinib   Obstructive sleep apnea with dependence on continuous positive airway pressure (CPAP)    Pericardial effusion    Primary osteoarthritis of left knee    Psoriasis of scalp    a.) Tx'd with deucravacitinib   Type 2 diabetes mellitus (HCC)    Past Surgical History:  Procedure Laterality Date   BLADDER SURGERY     x 3 resection bladder cancer   BROW LIFT Bilateral 05/27/2021   Procedure: BLEPHAROPLASTY UPPER EYELID; W/EXCESS SKIN AND BROW PTOSIS REPAIR BILATERAL;  Surgeon: Ashley Greig HERO, MD;  Location: [facility];  Service: Ophthalmology;  Laterality: Bilateral;   CARPAL TUNNEL RELEASE Right 06/06/2017   Procedure: CARPAL TUNNEL RELEASE;  Surgeon: Marchia Drivers, MD;  Location: [facility];  Service: Orthopedics;  Laterality: Right;   CATARACT EXTRACTION W/ INTRAOCULAR LENS  IMPLANT, BILATERAL Bilateral 08/2019   COLONOSCOPY  07/16/2012   EYE SURGERY Left 11/26/2019   Dr. Elner, Vit, Mem Benedetto    I & D EXTREMITY Right 06/06/2017   Procedure: IRRIGATION AND DEBRIDEMENT EXTREMITY--RIGHT ARM;  Surgeon: Marchia Drivers, MD;  Location: [facility];  Service: Orthopedics;  Laterality: Right;   KNEE ARTHROSCOPY Left 10/06/2019   Procedure: ARTHROSCOPY KNEE,PARTIAL MEDIAL MENISECTOMY,PARTIAL CHONDROPLASTY;  Surgeon: Mardee Lynwood SQUIBB, MD;  Location: [facility];  Service: Orthopedics;  Laterality: Left;   LUMBAR SPINE SURGERY     MEDIAL PARTIAL KNEE REPLACEMENT Right 12/29/2016   Makoplasty   OPEN REDUCTION INTERNAL FIXATION (ORIF) DISTAL RADIAL FRACTURE N/A 06/06/2017   Procedure: OPEN REDUCTION INTERNAL FIXATION (ORIF) DISTAL RADIAL FRACTURE;  Surgeon: Marchia Drivers, MD;  Location: [facility];  Service: Orthopedics;  Laterality: N/A;   TOTAL KNEE ARTHROPLASTY Left 07/16/2023   Procedure: ARTHROPLASTY, KNEE, TOTAL;  Surgeon: Lorelle Hussar, MD;  Location: [facility];  Service: Orthopedics;  Laterality: Left;   VAGINAL HYSTERECTOMY     VITRECTOMY Bilateral    Patient Active Problem List   Diagnosis Date Noted   Neuropathy 01/16/2024   S/P TKR (total knee replacement), left 07/16/2023   Primary osteoarthritis of left knee 06/11/2023   Elevated TSH 04/18/2022   Benign hypertension with CKD (chronic kidney disease) stage III (HCC) 01/17/2022   Hyperlipidemia associated with type 2 diabetes mellitus (HCC) 01/17/2022   OSA on CPAP 06/13/2021  Posterior capsular opacification, right 03/25/2020   Postoperative follow-up 03/18/2020   Left posterior capsular opacification 02/12/2020   Left epiretinal membrane 11/10/2019   Right epiretinal membrane 11/10/2019   Cystoid macular edema of left eye 11/10/2019   Glaucoma suspect, bilateral 11/10/2019   Cancer (HCC) 10/04/2019   Closed Colles' fracture 07/03/2017   Distal radius fracture, right 06/05/2017   Contusion of left knee 11/27/2016   Contusion of right knee 11/27/2016   Depression 03/26/2014   Depressed affect 09/25/2013   Difficulty sleeping  09/25/2013   Type 2 diabetes mellitus with other specified complication (HCC) 09/03/2013   Diverticular disease 07/17/2012   Diverticulosis of colon 07/17/2012   Arthritis 07/14/2012   Bladder cancer (HCC) 07/14/2012    PCP: Marsa Officer, DO REFERRING PROVIDER: Arthea Sheer, MD REFERRING DIAG: M70.52 (ICD-10-CM) - Other bursitis of knee, left knee  THERAPY DIAG:    Abnormality of gait  Muscle weakness (generalized)  Unsteadiness on feet  Chronic pain of left knee  Rationale for Evaluation and Treatment: Rehabilitation ONSET DATE: L TKA 07/16/23; knee pain noted at physician visit 10/26/23.   SUBJECTIVE:   SUBJECTIVE STATEMENT:     Pt says headache much better now, still anxious to seen neurology in Jan, wondering if maybe she should see ENT in the mean time- she does report baseline left sided roaring sounds, but no true vertigo.   PERTINENT HISTORY: The patient had been doing fantastic after her left total knee replacement. She has developed some pes anserine bursitis and hamstring tendinitis after increasing her activities. There is no instability or increased motion in flexion on her exam. We discussed treatment options for the tendinitis including topical medications, local steroid injections, bracing, physical therapy and home exercises. Patient would like to try some anti-inflammatories and a local injection and do some physical therapy for the knee. Will also give her a brace to help calm down the inflammation. We will see how she does with these interventions and have her follow-up in a few months for repeat evaluation. All questions answered she agrees with above plan. PMH: DM, HTN, CKD, hyperlipidemia, obesity, OA, OSA on CPAP, hx of bladder cancer, insomnia  PAIN:  Are you having pain? no  PRECAUTIONS: None  WEIGHT BEARING RESTRICTIONS: No  FALLS:  Has patient fallen in last 6 months? No  LIVING ENVIRONMENT: Lives with: lives alone Lives in:  House/apartment Stairs: No Has following equipment at home: Vannie - 2 wheeled, Tour manager, Grab bars, and raised commode  OCCUPATION: retired lexicographer, former LPN  PLOF: Independent  PATIENT GOALS:  reduce knee pain; get stronger; would like to be more active, be able to walk dog   OBJECTIVE:  Note: Objective measures were completed at Evaluation unless otherwise noted.  DIAGNOSTIC FINDINGS:  Per ortho MD note 11/28/23: 3 view x-rays AP, lateral, sunrise of the left knee ordered and taken today in clinic and images reviewed by myself show status post total knee arthroplasty with components in appropriate position. Patella tracking midline. No evidence of periprosthetic loosening or fracture.    LOWER EXTREMITY MMT:  MMT Right eval Left eval  Hip flexion 4- 3+*  Hip abduction 4+ 4+  Hip adduction 4+ 4+  Knee flexion 4+ 4-  Knee extension 4+ 4-  Ankle dorsiflexion 4- 4-  Ankle plantarflexion 5 5   (Blank rows = not tested) *=pain reported by pt  10RM Strength Assessment BLE: 01/17/24: 10RM testing in [facility] -Knee extension machine:   *Rt knee (nonsurgical) plate 2,  12 reps   *Lt knee, plate 1 (12 reps, slight lower RPE) -Knee flexion machine:   *Rt knee, plate 3, 13 reps   *Lt knee, plate 3, 16 reps  -single leg heel raise: 4x bilat -wall leaning ankle DFx12 bilat, equal velocity and amplitude bilat                                                                                                                               TREATMENT DATE: 02/11/2024 -trip to [facility] -assistance with getting onto wifi with iPhone so her medbridge app can work: never quite got the app videos to work, but pt was able to reference the list of exercises.  -Pt demonstrates independent machine setup and adjustment on Knee extension machine, hamstrings flexion machine, seated row machine, standing heel raises, and adjustable incline bench dumbbell chest press.  -Pt needed help  with equipment education for row machine and incline bench -repeat education on dialing in appropriate resistance levels -education on splitting up cardio and resistance workouts when schedule will allow to maximize gains in each area.  -Pt feels prepared to try 2 weeks on her own, goal to achieve at least 4 visits prior to return to PT.    PATIENT EDUCATION:  Education details: POC as related to future sessions, implementation of HEP at next session Person educated: Patient Education method: Explanation Education comprehension: verbalized understanding and needs further education  HOME EXERCISE PROGRAM:   Access Code: [email or web address] URL: [email or web address] Date: 02/04/2024 Prepared by: Lonni Gainer  Exercises - Supine Bridge  - 1 x daily - 7 x weekly - 3 sets - 10 reps - Supine Active Straight Leg Raise  - 1 x daily - 7 x weekly - 3 sets - 10 reps - Supine Hip Adduction Isometric with Ball  - 1 x daily - 7 x weekly - 3 sets - 10 reps - Hooklying Abduction with Resistance  - 1 x daily - 7 x weekly - 3 sets - 10 reps - Full Leg Press  - 1 x daily - 3 x weekly - 3 sets - 12 reps - Single Leg Knee Extension with Weight Machine  - 1 x daily - 3 x weekly - 3 sets - 12 reps - Hamstring Curl with Weight Machine  - 1 x daily - 3 x weekly - 3 sets - 12 reps - Single Leg Press  - 1 x daily - 3 x weekly - 2 sets - 12 reps - Heel Raises with Leg Press  - 1 x daily - 3 x weekly - 3 sets - 15 reps - Row  - 1 x daily - 3 x weekly - 3 sets - 12 reps - Nustep level 4-5 x 15 minutes   - 1 x daily - 3 x weekly   ASSESSMENT:  CLINICAL IMPRESSION:     Pt able to demonstrate correct machine setup in [facility] today, assisted with apporpriate settings for 2  exercises, pt writes these down. Pt unable to participate in full setting due to return headache. Pt will plan on comoplete program run through next session prior to 2 weeks of independent performance. May consider DC upon return  pending progress with independent program. Pt will continue to benefit from skilled therapy to address remaining deficits in order to improve overall QoL and return to PLOF.    OBJECTIVE IMPAIRMENTS: Abnormal gait, decreased activity tolerance, decreased balance, decreased knowledge of condition, decreased mobility, difficulty walking, decreased ROM, decreased strength, increased edema, increased fascial restrictions, and pain.   ACTIVITY LIMITATIONS: lifting, bending, sitting, standing, squatting, sleeping, stairs, transfers, bed mobility, dressing, and locomotion level  PARTICIPATION LIMITATIONS: cleaning, laundry, and community activity  PERSONAL FACTORS: Age, Fitness, Sex, Time since onset of injury/illness/exacerbation, and 3+ comorbidities: DM, HTN, CKD, hyperlipidemia, obesity, OA, OSA on CPAP, hx of bladder cancer, insomnia are also affecting patient's functional outcome.  REHAB POTENTIAL: Good CLINICAL DECISION MAKING: Stable/uncomplicated EVALUATION COMPLEXITY: Low   GOALS: Goals reviewed with patient? Yes  SHORT TERM GOALS: Target date: 01/25/24 Patient will be independent with home exercise program to improve strength/mobility for increased functional independence with ADLs and mobility. Baseline: to be established at future visit  Goal status: INITIAL  LONG TERM GOALS: Target date: 03/07/24 Patient will complete five times sit to stand test (5XSTS) in 12.8 seconds indicating an increased LE strength and improved balance & match age-based norms. Baseline: 18.19 sec; -5xSTS: 10.37sec hands free; 5xSTS: 7.99sec;  Goal status: ACHIEVED  2.  Patient will increase 10 meter walk test to >1.98m/s without antalgic gait or knee buckling as to improve gait speed for better community ambulation and to reduce fall risk. Baseline: normal: 1.11 m/s with antalgic gait; fast: 1.21 m/s with antalgic gait, knee buckling Goal status: INITIAL  3.  Patient will increase lower extremity  functional scale to >60/80 to demonstrate improved functional mobility and increased tolerance with ADLs.  Baseline: 40/80; 01/14/24: 84% Goal status: ACHIEVED  4.  Patient will increase six minute walk test distance to >1070ft for progression to community level ambulation, demonstrating improved gait endurance. Baseline: 01/14/24: 1475% Goal status: INITIAL  5.  Patient will be able to complete stair navigation independently without UE support & significant increase in pain.  Baseline:  Goal status: INITIAL  6.  Pt will decrease worst knee pain as reported on NPRS by at least 4 points in order to demonstrate clinically significant reduction in knee pain.  Baseline: 10/10 Goal status: INITIAL  PLAN:  PT FREQUENCY: 1-2x/week PT DURATION: 12 weeks PLANNED INTERVENTIONS: [id number]- PT Re-evaluation, 97750- Physical Performance Testing, 97110-Therapeutic exercises, 97530- Therapeutic activity, [id number]- Neuromuscular re-education, 97535- Self Care, 02859- Manual therapy, [id number]- Gait training, [phone]- Canalith repositioning, [id number]- Electrical stimulation (manual), 20560 (1-2 muscles), 20561 (3+ muscles)- Dry Needling, Patient/Family education, Balance training, Stair training, Joint mobilization, Scar mobilization, Cryotherapy, and Moist heat  PLAN FOR NEXT SESSION:  Review progress follow 2 weeks independent performance at [facility], address issues as needed; consider DC if appropriate.   3:13 PM, 02/11/24 Peggye JAYSON Linear, PT, DPT Physical Therapist - [place] [facility]  Outpatient Physical Therapy- Main Campus [phone]

## 2024-02-11 NOTE — Telephone Encounter (Signed)
 Copied from CRM [id number]. Topic: Clinical - Medication Question >> Feb 11, 2024 12:10 PM [name] wrote: Reason for CRM: Shanda with [facility] has called to request contact with a member of clinical staff to confirm continuation and refill of the patient's prescription for UBRELVY 100 MG TABS [[id number]]

## 2024-02-11 NOTE — Addendum Note (Signed)
 Addended by: EDMAN MARSA PARAS on: 02/11/2024 06:57 PM   Modules accepted: Orders

## 2024-02-11 NOTE — Telephone Encounter (Signed)
 Yes okay to confirm [facility] that she is continuing the [place] medication and she will need to fill it 16 pills for 30 days when she is next eligible.  I called patient and advised her she unfortunately cannot fill it that often. She was taking Ubrelvy too often, 100mg  x 2 in one day for migraine, unfortunately 16 pills = 8 doses if she takes 2, and that means can treat 8 headaches a month. She is still having persistent headaches and it is improved or resolved by the medicine but can come back.  She has Neurology apt April 28, 2024. So we are waiting for that apt for further management.  I will re order Rizatriptan  for her for AS NEEDED triptan use while we wait on next Ubrelvy 16 pill order, every 30 days.  Also start Amitriptyline 10mg  nightly for migraine prevention and can take 2 pills = 20mg  after 1 week if need for prevention. Future can consider other prevention meds.  Kelsey Officer, DO [facility] 02/11/2024, 6:56 PM

## 2024-02-12 MED ORDER — SUMATRIPTAN SUCCINATE 100 MG PO TABS: 100.0000 mg | ORAL_TABLET | Freq: Every day | ORAL | 2 refills | Status: AC | PRN

## 2024-02-12 NOTE — Telephone Encounter (Signed)
 Requested Prescriptions  Refused Prescriptions Disp Refills   rizatriptan  (MAXALT -MLT) 10 MG disintegrating tablet [Pharmacy Med Name: RIZATRIPTAN  10 MG ODT] 10 tablet 0    Sig: TAKE 1 TABLET (10 MG TOTAL) BY MOUTH AS NEEDED MAY REPEAT IN 2 HOURS IF NEEDED     Neurology:  Migraine Therapy - Triptan Passed - 02/12/2024  4:22 PM      Passed - Last BP in normal range    BP Readings from Last 1 Encounters:  01/16/24 126/74         Passed - Valid encounter within last 12 months    Recent Outpatient Visits           3 weeks ago Neuropathy   [facility] [facility] [place], [place] A, MD   1 month ago Acute non intractable tension-type headache   [place] [facility] [place], Marsa PARAS, DO   2 months ago Type 2 diabetes mellitus with other specified complication, without long-term current use of insulin [facility])   [place] [facility] [place], Marsa PARAS, DO   3 months ago Onychomycosis of left great toe   [place] [facility] [place], Marsa PARAS, DO   5 months ago Other fatigue   [place] [facility] [place], Angeline ORN, [place]

## 2024-02-12 NOTE — Addendum Note (Signed)
 Addended by: EDMAN MARSA PARAS on: 02/12/2024 10:25 AM   Modules accepted: Orders

## 2024-02-12 NOTE — Telephone Encounter (Signed)
 Spoke [facility], made them aware of her situation. The rizatriptan  is not covered by her insurance, is there something else we can prescribe.

## 2024-02-12 NOTE — Telephone Encounter (Signed)
 Okay, I discontinued Rizatriptan  and switched to Sumatriptan imitrex 100mg , same instructions, 16 pills sent to pharmacy. It may be a quantity issue. She can purchase it with cash / [email or web address] if they don't cover this one.  Marsa Officer, DO [facility] 02/12/2024, 10:24 AM

## 2024-02-14 ENCOUNTER — Other Ambulatory Visit: Payer: Self-pay | Admitting: Internal Medicine

## 2024-02-14 ENCOUNTER — Ambulatory Visit: Admitting: Physical Therapy

## 2024-02-15 NOTE — Telephone Encounter (Signed)
 Requested medication (s) are due for refill today: na   Requested medication (s) are on the active medication list: yes   Last refill:  10/01/23 #6 ml 0 refills  Future visit scheduled: no   Notes to clinic:  medication not assigned to a protocol. Do you want to refill Rx?     Requested Prescriptions  Pending Prescriptions Disp Refills   MOUNJARO  10 MG/0.5ML Pen [Pharmacy Med Name: MOUNJARO  10 MG/0.5 ML PEN]      Sig: INJECT 10 MG INTO THE SKIN ONE TIME PER WEEK     Off-Protocol Failed - 02/15/2024 12:28 PM      Failed - Medication not assigned to a protocol, review manually.      Passed - Valid encounter within last 12 months    Recent Outpatient Visits           1 month ago Neuropathy   [place] [facility] [place], [place] A, MD   1 month ago Acute non intractable tension-type headache   [place] [facility] [place], Marsa PARAS, DO   2 months ago Type 2 diabetes mellitus with other specified complication, without long-term current use of insulin [facility])   [employer] [facility] [place], Marsa PARAS, DO   3 months ago Onychomycosis of left great toe   [place] [facility] [place] Marsa PARAS, DO   5 months ago Other fatigue   [place] [facility] [place], Angeline ORN, [place]

## 2024-02-18 ENCOUNTER — Ambulatory Visit

## 2024-02-20 ENCOUNTER — Ambulatory Visit: Admitting: Physical Therapy

## 2024-02-21 ENCOUNTER — Ambulatory Visit

## 2024-02-22 NOTE — Progress Notes (Signed)
 Kelsey Mendoza                                          MRN: [id number]   02/22/2024   The [facility] Quality Team Specialist reviewed this patient medical record for the purposes of chart review for care gap closure. The following were reviewed: chart review for care gap closure-kidney health evaluation for diabetes:eGFR  and uACR.    [facility]BCI Quality Team

## 2024-02-25 ENCOUNTER — Ambulatory Visit: Admitting: Physical Therapy

## 2024-02-25 ENCOUNTER — Other Ambulatory Visit: Payer: Self-pay | Admitting: Pharmacist

## 2024-02-25 DIAGNOSIS — Z86018 Personal history of other benign neoplasm: Secondary | ICD-10-CM | POA: Diagnosis not present

## 2024-02-25 DIAGNOSIS — Z85828 Personal history of other malignant neoplasm of skin: Secondary | ICD-10-CM | POA: Diagnosis not present

## 2024-02-25 DIAGNOSIS — L400 Psoriasis vulgaris: Secondary | ICD-10-CM | POA: Diagnosis not present

## 2024-02-25 DIAGNOSIS — Z79899 Other long term (current) drug therapy: Secondary | ICD-10-CM | POA: Diagnosis not present

## 2024-02-25 DIAGNOSIS — Z79620 Long term (current) use of immunosuppressive biologic: Secondary | ICD-10-CM | POA: Diagnosis not present

## 2024-02-25 DIAGNOSIS — Z7985 Long-term (current) use of injectable non-insulin antidiabetic drugs: Secondary | ICD-10-CM

## 2024-02-25 DIAGNOSIS — L578 Other skin changes due to chronic exposure to nonionizing radiation: Secondary | ICD-10-CM | POA: Diagnosis not present

## 2024-02-25 DIAGNOSIS — D485 Neoplasm of uncertain behavior of skin: Secondary | ICD-10-CM | POA: Diagnosis not present

## 2024-02-25 DIAGNOSIS — L218 Other seborrheic dermatitis: Secondary | ICD-10-CM | POA: Diagnosis not present

## 2024-02-25 DIAGNOSIS — B029 Zoster without complications: Secondary | ICD-10-CM | POA: Diagnosis not present

## 2024-02-25 DIAGNOSIS — L570 Actinic keratosis: Secondary | ICD-10-CM | POA: Diagnosis not present

## 2024-02-25 DIAGNOSIS — Z872 Personal history of diseases of the skin and subcutaneous tissue: Secondary | ICD-10-CM | POA: Diagnosis not present

## 2024-02-25 DIAGNOSIS — Z859 Personal history of malignant neoplasm, unspecified: Secondary | ICD-10-CM | POA: Diagnosis not present

## 2024-02-25 DIAGNOSIS — C44329 Squamous cell carcinoma of skin of other parts of face: Secondary | ICD-10-CM | POA: Diagnosis not present

## 2024-02-25 DIAGNOSIS — E1169 Type 2 diabetes mellitus with other specified complication: Secondary | ICD-10-CM

## 2024-02-25 NOTE — Progress Notes (Unsigned)
   02/25/2024  Patient ID: Kelsey Mendoza, female   DOB: December 16, 1950, 73 y.o.   MRN: [id number]  This patient is appearing on a report for being at risk of failing the adherence measure for diabetes medications this calendar year.   Medication: Mounjaro  10 mg weekly Last fill date: 01/21/2024 for 28 day supply  Outreach to patient today. Reports that she picked up a 3 month supply of the Mounjaro  from [facility].  - Follow up and confirm patient picked up 84 day supply on 02/15/2024  During our conversation patient also shares that she started taking Amitriptyline 10 mg daily for migraine prevention ~2 weeks ago as prescribed by PCP and that this is going well. - Counsel patient to use caution for potential dizziness, sedation and drying side effects from amitriptyline. States that she has noticed having dry mouth overnight since started, but managed by keeping a glass of water by bed to sip when wakes up overnight. Denies morning sedation - Plans to attend upcoming appointment with Neurologist on 04/28/2024 - Advise patient to contact pharmacy or office when needing a refill of amitriptyline   Sharyle Sia, PharmD, [facility] [phone]

## 2024-02-28 ENCOUNTER — Ambulatory Visit

## 2024-03-03 ENCOUNTER — Ambulatory Visit

## 2024-03-03 DIAGNOSIS — M6281 Muscle weakness (generalized): Secondary | ICD-10-CM

## 2024-03-03 DIAGNOSIS — R269 Unspecified abnormalities of gait and mobility: Secondary | ICD-10-CM

## 2024-03-03 DIAGNOSIS — R2681 Unsteadiness on feet: Secondary | ICD-10-CM

## 2024-03-03 DIAGNOSIS — G8929 Other chronic pain: Secondary | ICD-10-CM

## 2024-03-03 NOTE — Therapy (Signed)
 OUTPATIENT PHYSICAL THERAPY TREATMENT/DISCHARGE  Patient Name: Kelsey Mendoza MRN: [id number] DOB:1951-02-17, 73 y.o., female Today's Date: 03/03/2024  END OF SESSION:    PT End of Session - 03/03/24 0905     Visit Number 17    Number of Visits 24    PT Start Time 0908    PT Stop Time 1000    PT Time Calculation (min) 52 min              Past Medical History:  Diagnosis Date   Bladder cancer (HCC) 2000   resection x 3 and BCG therapy   CAD (coronary artery disease)    Chronic kidney disease, stage 3 (HCC)    Diastolic dysfunction    Diverticulosis    Fatigue    History of bilateral cataract extraction    HLD (hyperlipidemia)    HTN (hypertension)    Insomnia    Long-term current use of immunosuppressive biologic agent    a.) deucravacitinib   Obstructive sleep apnea with dependence on continuous positive airway pressure (CPAP)    Pericardial effusion    Primary osteoarthritis of left knee    Psoriasis of scalp    a.) Tx'd with deucravacitinib   Type 2 diabetes mellitus (HCC)    Past Surgical History:  Procedure Laterality Date   BLADDER SURGERY     x 3 resection bladder cancer   BROW LIFT Bilateral 05/27/2021   Procedure: BLEPHAROPLASTY UPPER EYELID; W/EXCESS SKIN AND BROW PTOSIS REPAIR BILATERAL;  Surgeon: Ashley Greig HERO, MD;  Location: [facility];  Service: Ophthalmology;  Laterality: Bilateral;   CARPAL TUNNEL RELEASE Right 06/06/2017   Procedure: CARPAL TUNNEL RELEASE;  Surgeon: Marchia Drivers, MD;  Location: [facility];  Service: Orthopedics;  Laterality: Right;   CATARACT EXTRACTION W/ INTRAOCULAR LENS  IMPLANT, BILATERAL Bilateral 08/2019   COLONOSCOPY  07/16/2012   EYE SURGERY Left 11/26/2019   Dr. Elner, Vit, Mem Benedetto   I & D EXTREMITY Right 06/06/2017   Procedure: IRRIGATION AND DEBRIDEMENT EXTREMITY--RIGHT ARM;  Surgeon: Marchia Drivers, MD;  Location: [facility];  Service: Orthopedics;  Laterality: Right;   KNEE ARTHROSCOPY Left  10/06/2019   Procedure: ARTHROSCOPY KNEE,PARTIAL MEDIAL MENISECTOMY,PARTIAL CHONDROPLASTY;  Surgeon: Mardee Lynwood SQUIBB, MD;  Location: [facility];  Service: Orthopedics;  Laterality: Left;   LUMBAR SPINE SURGERY     MEDIAL PARTIAL KNEE REPLACEMENT Right 12/29/2016   Makoplasty   OPEN REDUCTION INTERNAL FIXATION (ORIF) DISTAL RADIAL FRACTURE N/A 06/06/2017   Procedure: OPEN REDUCTION INTERNAL FIXATION (ORIF) DISTAL RADIAL FRACTURE;  Surgeon: Marchia Drivers, MD;  Location: [facility];  Service: Orthopedics;  Laterality: N/A;   TOTAL KNEE ARTHROPLASTY Left 07/16/2023   Procedure: ARTHROPLASTY, KNEE, TOTAL;  Surgeon: Lorelle Hussar, MD;  Location: [facility];  Service: Orthopedics;  Laterality: Left;   VAGINAL HYSTERECTOMY     VITRECTOMY Bilateral    Patient Active Problem List   Diagnosis Date Noted   Neuropathy 01/16/2024   S/P TKR (total knee replacement), left 07/16/2023   Primary osteoarthritis of left knee 06/11/2023   Elevated TSH 04/18/2022   Benign hypertension with CKD (chronic kidney disease) stage III (HCC) 01/17/2022   Hyperlipidemia associated with type 2 diabetes mellitus (HCC) 01/17/2022   OSA on CPAP 06/13/2021   Posterior capsular opacification, right 03/25/2020   Postoperative follow-up 03/18/2020   Left posterior capsular opacification 02/12/2020   Left epiretinal membrane 11/10/2019   Right epiretinal membrane 11/10/2019   Cystoid macular edema of left eye 11/10/2019   Glaucoma suspect, bilateral  11/10/2019   Cancer (HCC) 10/04/2019   Closed Colles' fracture 07/03/2017   Distal radius fracture, right 06/05/2017   Contusion of left knee 11/27/2016   Contusion of right knee 11/27/2016   Depression 03/26/2014   Depressed affect 09/25/2013   Difficulty sleeping 09/25/2013   Type 2 diabetes mellitus with other specified complication (HCC) 09/03/2013   Diverticular disease 07/17/2012   Diverticulosis of colon 07/17/2012   Arthritis 07/14/2012   Bladder cancer (HCC)  07/14/2012    PCP: Marsa Officer, DO REFERRING PROVIDER: Arthea Sheer, MD REFERRING DIAG: M70.52 (ICD-10-CM) - Other bursitis of knee, left knee  THERAPY DIAG:    Abnormality of gait  Muscle weakness (generalized)  Unsteadiness on feet  Chronic pain of left knee  Rationale for Evaluation and Treatment: Rehabilitation ONSET DATE: L TKA 07/16/23; knee pain noted at physician visit 10/26/23.   SUBJECTIVE:   SUBJECTIVE STATEMENT:    03/03/2024: Patient reports having 1/10 pain in L knee upon arrival. Patient reports she is signed up for the [facility], but has not been back to [facility] since previous treatment session due to various reason. Patient reports her headaches have improved since last treatment and she plans to see a neurologist in January.   PERTINENT HISTORY:   The patient had been doing fantastic after her left total knee replacement. She has developed some pes anserine bursitis and hamstring tendinitis after increasing her activities. There is no instability or increased motion in flexion on her exam. We discussed treatment options for the tendinitis including topical medications, local steroid injections, bracing, physical therapy and home exercises. Patient would like to try some anti-inflammatories and a local injection and do some physical therapy for the knee. Will also give her a brace to help calm down the inflammation. We will see how she does with these interventions and have her follow-up in a few months for repeat evaluation. All questions answered she agrees with above plan. PMH: DM, HTN, CKD, hyperlipidemia, obesity, OA, OSA on CPAP, hx of bladder cancer, insomnia  PAIN:  Are you having pain? no  PRECAUTIONS: None  WEIGHT BEARING RESTRICTIONS: No  FALLS:  Has patient fallen in last 6 months? No  LIVING ENVIRONMENT: Lives with: lives alone Lives in: House/apartment Stairs: No Has following equipment at home: Vannie - 2 wheeled, Nurse, mental health, Grab bars, and raised commode  OCCUPATION: retired lexicographer, former LPN  PLOF: Independent  PATIENT GOALS:  reduce knee pain; get stronger; would like to be more active, be able to walk dog   OBJECTIVE:  Note: Objective measures were completed at Evaluation unless otherwise noted.  DIAGNOSTIC FINDINGS:  Per ortho MD note 11/28/23: 3 view x-rays AP, lateral, sunrise of the left knee ordered and taken today in clinic and images reviewed by myself show status post total knee arthroplasty with components in appropriate position. Patella tracking midline. No evidence of periprosthetic loosening or fracture.    LOWER EXTREMITY MMT:  MMT Right eval Left eval  Hip flexion 4- 3+*  Hip abduction 4+ 4+  Hip adduction 4+ 4+  Knee flexion 4+ 4-  Knee extension 4+ 4-  Ankle dorsiflexion 4- 4-  Ankle plantarflexion 5 5   (Blank rows = not tested) *=pain reported by pt  10RM Strength Assessment BLE: 01/17/24: 10RM testing in [facility] -Knee extension machine:   *Rt knee (nonsurgical) plate 2, 12 reps   *Lt knee, plate 1 (12 reps, slight lower RPE) -Knee flexion machine:   *Rt knee, plate 3, 13  reps   *Lt knee, plate 3, 16 reps  -single leg heel raise: 4x bilat -wall leaning ankle DFx12 bilat, equal velocity and amplitude bilat                                                                                                                               TREATMENT DATE: 03/03/2024 -[facility]  -Patient demonstrated independence with HS curl, LE extension, and leg press machines for seat adjustments, LE adjustments, and resistance adjustments. (3x10 each).  -Heel raises 30x  -Pt independence also demonstrated with UE exercises including seated row and incline dumbbell bench press. (3x10 each). - : 1,400', 10 meter walk test >1.0 m/s without gait deviations or L knee buckling. Stairs performed independently with good mechanics/balance noted with reciprocal  stepping.     PATIENT EDUCATION:  Education details: POC as related to future sessions, implementation of HEP at next session Person educated: Patient Education method: Explanation Education comprehension: verbalized understanding and needs further education  HOME EXERCISE PROGRAM:   Access Code: [email or web address] URL: [email or web address] Date: 02/04/2024 Prepared by: Lonni Gainer  Exercises - Supine Bridge  - 1 x daily - 7 x weekly - 3 sets - 10 reps - Supine Active Straight Leg Raise  - 1 x daily - 7 x weekly - 3 sets - 10 reps - Supine Hip Adduction Isometric with Ball  - 1 x daily - 7 x weekly - 3 sets - 10 reps - Hooklying Abduction with Resistance  - 1 x daily - 7 x weekly - 3 sets - 10 reps - Full Leg Press  - 1 x daily - 3 x weekly - 3 sets - 12 reps - Single Leg Knee Extension with Weight Machine  - 1 x daily - 3 x weekly - 3 sets - 12 reps - Hamstring Curl with Weight Machine  - 1 x daily - 3 x weekly - 3 sets - 12 reps - Single Leg Press  - 1 x daily - 3 x weekly - 2 sets - 12 reps - Heel Raises with Leg Press  - 1 x daily - 3 x weekly - 3 sets - 15 reps - Row  - 1 x daily - 3 x weekly - 3 sets - 12 reps - Nustep level 4-5 x 15 minutes   - 1 x daily - 3 x weekly   ASSESSMENT:  CLINICAL IMPRESSION:     03/03/2024:   -Upon arrival, patient reported that she believes she is okay to discharge at this time as she is confident in her ability to perform HEP independently/continue her [facility] in [facility] 2-3x/week.   -Remaining goals were re-assessed and patient demonstrate ability to ascend/descend steps without UE assist in reciprocal fashion, walked 1,400' with , and performed with speed > 1.0 m/s without knee buckling/gait deficits.   -Patient demonstrated independence with HEP in the [facility] today and reported having no  concerns with performing the exercises on her HEP.   -Overall, patient demonstrated improvement toward her goals  throughout today's treatment/re-assessment meeting all goals. She also feels comfortable with discharge at this time and demonstrated ability to continue HEP independently and safely. I discussed with patient her progress/POC/plan for discharge and recommended discharge at this time due to goals met and patient's independence with HEP.      OBJECTIVE IMPAIRMENTS: Abnormal gait, decreased activity tolerance, decreased balance, decreased knowledge of condition, decreased mobility, difficulty walking, decreased ROM, decreased strength, increased edema, increased fascial restrictions, and pain.   ACTIVITY LIMITATIONS: lifting, bending, sitting, standing, squatting, sleeping, stairs, transfers, bed mobility, dressing, and locomotion level  PARTICIPATION LIMITATIONS: cleaning, laundry, and community activity  PERSONAL FACTORS: Age, Fitness, Sex, Time since onset of injury/illness/exacerbation, and 3+ comorbidities: DM, HTN, CKD, hyperlipidemia, obesity, OA, OSA on CPAP, hx of bladder cancer, insomnia are also affecting patient's functional outcome.  REHAB POTENTIAL: Good CLINICAL DECISION MAKING: Stable/uncomplicated EVALUATION COMPLEXITY: Low   GOALS: Goals reviewed with patient? Yes  SHORT TERM GOALS: Target date: 01/25/24 Patient will be independent with home exercise program to improve strength/mobility for increased functional independence with ADLs and mobility. Baseline: to be established at future visit  Goal status: INITIAL  LONG TERM GOALS: Target date: 03/07/24 Patient will complete five times sit to stand test (5XSTS) in 12.8 seconds indicating an increased LE strength and improved balance & match age-based norms. Baseline: 18.19 sec; -5xSTS: 10.37sec hands free; 5xSTS: 7.99sec;  Goal status: ACHIEVED  2.  Patient will increase 10 meter walk test to >1.22m/s without antalgic gait or knee buckling as to improve gait speed for better community ambulation and to reduce fall  risk. Baseline: normal: 1.11 m/s with antalgic gait; fast: 1.21 m/s with antalgic gait, knee buckling Goal status: Achieved - 03/03/2024  3.  Patient will increase lower extremity functional scale to >60/80 to demonstrate improved functional mobility and increased tolerance with ADLs.  Baseline: 40/80; 01/14/24: 84% Goal status: ACHIEVED  4.  Patient will increase six minute walk test distance to >1038ft for progression to community level ambulation, demonstrating improved gait endurance. Baseline: 01/14/24: 1475% Goal status: Achieved: 1,400'. - 03/03/2024.  5.  Patient will be able to complete stair navigation independently without UE support & significant increase in pain.  Baseline:  Goal status: Achieved -  03/03/24  6.  Pt will decrease worst knee pain as reported on NPRS by at least 4 points in order to demonstrate clinically significant reduction in knee pain.  Baseline: 10/10 Goal status: Achieved   PLAN:  PT FREQUENCY: 1-2x/week PT DURATION: 12 weeks PLANNED INTERVENTIONS: [id number]- PT Re-evaluation, 97750- Physical Performance Testing, 97110-Therapeutic exercises, 97530- Therapeutic activity, [id number]- Neuromuscular re-education, 97535- Self Care, [id number]- Manual therapy, [id number]- Gait training, [phone]- Canalith repositioning, [id number]- Electrical stimulation (manual), 20560 (1-2 muscles), 20561 (3+ muscles)- Dry Needling, Patient/Family education, Balance training, Stair training, Joint mobilization, Scar mobilization, Cryotherapy, and Moist heat  PLAN FOR NEXT SESSION:  Patient will discharge from PT services at this time and continue her HEP independently at the [facility] 2-3x/week.   1:30 PM, 03/03/24 Norman Sharps, PT, DPT Physical Therapist - [facility]  Outpatient Physical Therapy- Main Campus [phone]

## 2024-03-05 ENCOUNTER — Other Ambulatory Visit: Payer: Self-pay | Admitting: Family Medicine

## 2024-03-05 DIAGNOSIS — G43909 Migraine, unspecified, not intractable, without status migrainosus: Secondary | ICD-10-CM

## 2024-03-10 ENCOUNTER — Ambulatory Visit

## 2024-03-10 NOTE — Telephone Encounter (Signed)
 Requested Prescriptions  Pending Prescriptions Disp Refills   amitriptyline  (ELAVIL ) 10 MG tablet [Pharmacy Med Name: AMITRIPTYLINE  HCL 10 MG TAB] 180 tablet 0    Sig: TAKE 1 TABLET BY MOUTH AT BEDTIME. AFTER 1 WEEK, INCREASE TO 2 TABS NIGHTLY FOR MIGRAINE PREVENTION IF NEEDED.     Psychiatry:  Antidepressants - Heterocyclics (TCAs) Passed - 03/10/2024  9:43 AM      Passed - Completed PHQ-2 or PHQ-9 in the last 360 days      Passed - Valid encounter within last 6 months    Recent Outpatient Visits           1 month ago Neuropathy   [place] [facility] [place], Richey A, MD   2 months ago Acute non intractable tension-type headache   [place] [facility] [place], Marsa PARAS, DO   2 months ago Type 2 diabetes mellitus with other specified complication, without long-term current use of insulin [facility])   [place] [facility] [place], Marsa PARAS, DO   4 months ago Onychomycosis of left great toe   [place] [facility] [place] Marsa PARAS, DO   6 months ago Other fatigue   [place] [facility], Angeline ORN, [place]

## 2024-03-13 ENCOUNTER — Ambulatory Visit

## 2024-03-14 ENCOUNTER — Other Ambulatory Visit: Payer: Self-pay | Admitting: Family Medicine

## 2024-03-14 DIAGNOSIS — Z1231 Encounter for screening mammogram for malignant neoplasm of breast: Secondary | ICD-10-CM

## 2024-03-17 ENCOUNTER — Ambulatory Visit

## 2024-03-20 ENCOUNTER — Ambulatory Visit

## 2024-03-24 ENCOUNTER — Ambulatory Visit

## 2024-03-27 ENCOUNTER — Ambulatory Visit

## 2024-03-31 ENCOUNTER — Ambulatory Visit

## 2024-04-07 ENCOUNTER — Ambulatory Visit: Admitting: Physical Therapy

## 2024-04-09 ENCOUNTER — Encounter (INDEPENDENT_AMBULATORY_CARE_PROVIDER_SITE_OTHER): Payer: Self-pay

## 2024-04-10 ENCOUNTER — Other Ambulatory Visit: Payer: Self-pay | Admitting: Medical Genetics

## 2024-04-11 ENCOUNTER — Ambulatory Visit

## 2024-04-11 ENCOUNTER — Other Ambulatory Visit
Admission: RE | Admit: 2024-04-11 | Discharge: 2024-04-11 | Disposition: A | Payer: Self-pay | Source: Ambulatory Visit | Attending: Medical Genetics | Admitting: Medical Genetics

## 2024-04-14 ENCOUNTER — Ambulatory Visit: Admitting: Physical Therapy

## 2024-04-16 ENCOUNTER — Encounter

## 2024-04-16 NOTE — Progress Notes (Signed)
 MARGORIE RENNER                                          MRN: [id number]   04/16/2024   The [facility] Quality Team Specialist reviewed this patient medical record for the purposes of chart review for care gap closure. The following were reviewed: chart review for care gap closure-kidney health evaluation for diabetes:eGFR  and uACR.    [facility] Quality Team

## 2024-04-18 ENCOUNTER — Ambulatory Visit
Admission: RE | Admit: 2024-04-18 | Discharge: 2024-04-18 | Disposition: A | Source: Ambulatory Visit | Attending: Family Medicine | Admitting: Family Medicine

## 2024-04-18 DIAGNOSIS — Z1231 Encounter for screening mammogram for malignant neoplasm of breast: Secondary | ICD-10-CM | POA: Diagnosis present

## 2024-04-21 ENCOUNTER — Ambulatory Visit: Admitting: Physical Therapy

## 2024-04-23 LAB — GENECONNECT MOLECULAR SCREEN: Genetic Analysis Overall Interpretation: NEGATIVE

## 2024-04-28 ENCOUNTER — Ambulatory Visit: Admitting: Physical Therapy

## 2024-05-05 ENCOUNTER — Ambulatory Visit: Admitting: Physical Therapy

## 2024-05-12 ENCOUNTER — Ambulatory Visit: Admitting: Physical Therapy

## 2024-05-19 ENCOUNTER — Ambulatory Visit: Admitting: Physical Therapy

## 2024-05-26 ENCOUNTER — Ambulatory Visit: Admitting: Physical Therapy

## 2024-06-02 ENCOUNTER — Ambulatory Visit: Admitting: Physical Therapy

## 2024-06-09 ENCOUNTER — Ambulatory Visit: Admitting: Physical Therapy

## 2024-06-12 ENCOUNTER — Other Ambulatory Visit

## 2024-06-16 ENCOUNTER — Ambulatory Visit: Admitting: Physical Therapy

## 2024-06-19 ENCOUNTER — Encounter: Admitting: Family Medicine

## 2024-06-23 ENCOUNTER — Ambulatory Visit: Admitting: Physical Therapy

## 2024-09-26 ENCOUNTER — Ambulatory Visit

## 2024-10-01 ENCOUNTER — Ambulatory Visit
